# Patient Record
Sex: Male | Born: 1935 | Race: White | Hispanic: No | Marital: Married | Smoking: Former smoker
Health system: Southern US, Community
[De-identification: ages and names within clinical notes are randomized; demographics above are authoritative.]

## PROBLEM LIST (undated history)

## (undated) ENCOUNTER — Emergency Department (HOSPITAL_BASED_OUTPATIENT_CLINIC_OR_DEPARTMENT_OTHER): Payer: Medicare Other

## (undated) DIAGNOSIS — M1990 Unspecified osteoarthritis, unspecified site: Secondary | ICD-10-CM

## (undated) DIAGNOSIS — T4145XA Adverse effect of unspecified anesthetic, initial encounter: Secondary | ICD-10-CM

## (undated) DIAGNOSIS — K3184 Gastroparesis: Secondary | ICD-10-CM

## (undated) DIAGNOSIS — G4730 Sleep apnea, unspecified: Secondary | ICD-10-CM

## (undated) DIAGNOSIS — I6529 Occlusion and stenosis of unspecified carotid artery: Secondary | ICD-10-CM

## (undated) DIAGNOSIS — Z973 Presence of spectacles and contact lenses: Secondary | ICD-10-CM

## (undated) DIAGNOSIS — I499 Cardiac arrhythmia, unspecified: Secondary | ICD-10-CM

## (undated) DIAGNOSIS — I2510 Atherosclerotic heart disease of native coronary artery without angina pectoris: Secondary | ICD-10-CM

## (undated) DIAGNOSIS — R51 Headache: Secondary | ICD-10-CM

## (undated) DIAGNOSIS — R200 Anesthesia of skin: Secondary | ICD-10-CM

## (undated) DIAGNOSIS — M67919 Unspecified disorder of synovium and tendon, unspecified shoulder: Secondary | ICD-10-CM

## (undated) DIAGNOSIS — Z8719 Personal history of other diseases of the digestive system: Secondary | ICD-10-CM

## (undated) DIAGNOSIS — J45909 Unspecified asthma, uncomplicated: Secondary | ICD-10-CM

## (undated) DIAGNOSIS — K219 Gastro-esophageal reflux disease without esophagitis: Secondary | ICD-10-CM

## (undated) DIAGNOSIS — G459 Transient cerebral ischemic attack, unspecified: Secondary | ICD-10-CM

## (undated) DIAGNOSIS — I10 Essential (primary) hypertension: Secondary | ICD-10-CM

## (undated) DIAGNOSIS — K589 Irritable bowel syndrome without diarrhea: Secondary | ICD-10-CM

## (undated) DIAGNOSIS — H9190 Unspecified hearing loss, unspecified ear: Secondary | ICD-10-CM

## (undated) DIAGNOSIS — H269 Unspecified cataract: Secondary | ICD-10-CM

## (undated) DIAGNOSIS — T8859XA Other complications of anesthesia, initial encounter: Secondary | ICD-10-CM

## (undated) DIAGNOSIS — E785 Hyperlipidemia, unspecified: Secondary | ICD-10-CM

## (undated) DIAGNOSIS — R3911 Hesitancy of micturition: Secondary | ICD-10-CM

## (undated) HISTORY — DX: Irritable bowel syndrome, unspecified: K58.9

## (undated) HISTORY — PX: CAROTID ENDARTERECTOMY: SUR193

## (undated) HISTORY — PX: BACK SURGERY: SHX140

## (undated) HISTORY — DX: Unspecified asthma, uncomplicated: J45.909

## (undated) HISTORY — DX: Gastro-esophageal reflux disease without esophagitis: K21.9

## (undated) HISTORY — PX: CARDIAC CATHETERIZATION: SHX172

## (undated) HISTORY — DX: Occlusion and stenosis of unspecified carotid artery: I65.29

## (undated) HISTORY — PX: OTHER SURGICAL HISTORY: SHX169

## (undated) HISTORY — PX: COLONOSCOPY: SHX5424

## (undated) HISTORY — PX: CORONARY STENT PLACEMENT: SHX1402

## (undated) HISTORY — PX: HIATAL HERNIA REPAIR: SHX195

## (undated) HISTORY — PX: CARDIOVASCULAR STRESS TEST: SHX262

## (undated) HISTORY — PX: TONSILLECTOMY: SUR1361

## (undated) HISTORY — DX: Atherosclerotic heart disease of native coronary artery without angina pectoris: I25.10

## (undated) HISTORY — PX: KNEE ARTHROSCOPY: SHX127

## (undated) HISTORY — PX: HERNIA REPAIR: SHX51

---

## 1997-06-27 ENCOUNTER — Observation Stay (HOSPITAL_COMMUNITY): Admission: AD | Admit: 1997-06-27 | Discharge: 1997-06-28 | Payer: Self-pay | Admitting: Interventional Cardiology

## 1997-07-11 ENCOUNTER — Encounter (HOSPITAL_COMMUNITY): Admission: RE | Admit: 1997-07-11 | Discharge: 1997-10-09 | Payer: Self-pay | Admitting: Interventional Cardiology

## 1998-04-05 ENCOUNTER — Ambulatory Visit (HOSPITAL_COMMUNITY): Admission: RE | Admit: 1998-04-05 | Discharge: 1998-04-05 | Payer: Self-pay | Admitting: Gastroenterology

## 1999-02-13 ENCOUNTER — Encounter: Payer: Self-pay | Admitting: Gastroenterology

## 1999-02-13 ENCOUNTER — Ambulatory Visit (HOSPITAL_COMMUNITY): Admission: RE | Admit: 1999-02-13 | Discharge: 1999-02-13 | Payer: Self-pay | Admitting: Gastroenterology

## 2001-01-04 ENCOUNTER — Ambulatory Visit (HOSPITAL_COMMUNITY): Admission: RE | Admit: 2001-01-04 | Discharge: 2001-01-04 | Payer: Self-pay | Admitting: Surgery

## 2001-01-04 ENCOUNTER — Encounter: Payer: Self-pay | Admitting: Surgery

## 2001-11-22 ENCOUNTER — Ambulatory Visit (HOSPITAL_BASED_OUTPATIENT_CLINIC_OR_DEPARTMENT_OTHER): Admission: RE | Admit: 2001-11-22 | Discharge: 2001-11-22 | Payer: Self-pay | Admitting: Internal Medicine

## 2001-12-29 ENCOUNTER — Ambulatory Visit (HOSPITAL_BASED_OUTPATIENT_CLINIC_OR_DEPARTMENT_OTHER): Admission: RE | Admit: 2001-12-29 | Discharge: 2001-12-29 | Payer: Self-pay | Admitting: Internal Medicine

## 2004-01-22 ENCOUNTER — Encounter: Admission: RE | Admit: 2004-01-22 | Discharge: 2004-01-22 | Payer: Self-pay | Admitting: Gastroenterology

## 2006-01-08 ENCOUNTER — Ambulatory Visit (HOSPITAL_COMMUNITY): Admission: RE | Admit: 2006-01-08 | Discharge: 2006-01-08 | Payer: Self-pay | Admitting: Orthopedic Surgery

## 2007-02-23 ENCOUNTER — Encounter: Admission: RE | Admit: 2007-02-23 | Discharge: 2007-02-23 | Payer: Self-pay | Admitting: Gastroenterology

## 2007-04-23 ENCOUNTER — Encounter: Admission: RE | Admit: 2007-04-23 | Discharge: 2007-04-23 | Payer: Self-pay | Admitting: Gastroenterology

## 2007-10-19 ENCOUNTER — Encounter: Admission: RE | Admit: 2007-10-19 | Discharge: 2007-10-19 | Payer: Self-pay | Admitting: Family

## 2009-02-06 ENCOUNTER — Encounter: Admission: RE | Admit: 2009-02-06 | Discharge: 2009-02-06 | Payer: Self-pay | Admitting: Gastroenterology

## 2009-02-25 ENCOUNTER — Emergency Department (HOSPITAL_COMMUNITY): Admission: EM | Admit: 2009-02-25 | Discharge: 2009-02-25 | Payer: Self-pay | Admitting: Emergency Medicine

## 2009-05-29 ENCOUNTER — Emergency Department (HOSPITAL_COMMUNITY): Admission: EM | Admit: 2009-05-29 | Discharge: 2009-05-29 | Payer: Self-pay | Admitting: Family Medicine

## 2010-05-12 LAB — CBC
HCT: 38.6 % — ABNORMAL LOW (ref 39.0–52.0)
Platelets: 207 10*3/uL (ref 150–400)

## 2010-05-12 LAB — D-DIMER, QUANTITATIVE: D-Dimer, Quant: 0.24 ug/mL-FEU (ref 0.00–0.48)

## 2010-05-12 LAB — DIFFERENTIAL
Lymphocytes Relative: 22 % (ref 12–46)
Lymphs Abs: 1.9 10*3/uL (ref 0.7–4.0)
Monocytes Relative: 6 % (ref 3–12)
Neutrophils Relative %: 70 % (ref 43–77)

## 2010-05-12 LAB — BASIC METABOLIC PANEL
CO2: 25 mEq/L (ref 19–32)
Chloride: 104 mEq/L (ref 96–112)
Creatinine, Ser: 0.98 mg/dL (ref 0.4–1.5)
Potassium: 3.2 mEq/L — ABNORMAL LOW (ref 3.5–5.1)
Sodium: 138 mEq/L (ref 135–145)

## 2010-05-12 LAB — POCT CARDIAC MARKERS
CKMB, poc: 1.7 ng/mL (ref 1.0–8.0)
Myoglobin, poc: 68.4 ng/mL (ref 12–200)
Troponin i, poc: <0.05 ng/mL (ref 0.00–0.09)

## 2010-05-12 LAB — POCT I-STAT, CHEM 8: Chloride: 107 mEq/L (ref 96–112)

## 2010-07-12 NOTE — Op Note (Signed)
[facility]  Patient:    Alex Oconnor, Alex Oconnor Visit Number: [id number] MRN: [id number]          Service Type: DSU Location: DAY Attending Physician:  Bonnetta Barry Dictated by:   Velora Heckler, M.D. Proc. Date: 01/04/01 Admit Date:  01/04/2001   CC:         Thora Lance, M.D.  Darci Needle, M.D.   Operative Report  PREOPERATIVE DIAGNOSIS:  Right inguinal hernia.  POSTOPERATIVE DIAGNOSIS:  Right inguinal hernia.  OPERATION:  Repair of right inguinal hernia with polyester mesh.  SURGEON: Velora Heckler, M.D.  ANESTHESIA:  General  ESTIMATED BLOOD LOSS:  Minimal  PREPARATION:  Betadine  COMPLICATIONS:  None.  INDICATIONS:  The patient is a 77 year old white male who presents with long standing history of right inguinal hernia.  This had been present on physical examination.  It began causing him moderate discomfort after physical exertion.  Pain radiated into the right testicle.  The patient now comes to surgery for repair of right inguinal hernia.  DESCRIPTION OF PROCEDURE:  Procedure is done in OR #1 at the [facility].  The patient is brought to the operating room and placed in a supine position on the operating room table.  Following the administration of general anesthesia, the patient was prepped and draped in the usual strict aseptic fashion.  After ascertaining that an adequate level of anesthesia had been obtained, a right inguinal incision was made with a #10 blade.  Dissection was carried down through the subcutaneous tissues and hemostasis obtained with the electrocautery.  External oblique fascia was incised in line with its fibers and extended through the external inguinal ring.  Spermatic cord structures were encircled with a Penrose drain. There is a moderate size direct inguinal hernia present.  Hernia sac is dissected away from the cord structures.  The floor of the inguinal canal is  dissected out. Hernia is reduced through the fascial defect and the fascial defect closed with interrupted 3-0 Vicryl sutures.  Floor of the mesh was then recreated with a sheet of polyester mesh.  The mesh was cut to the appropriate dimensions.  It is secured to the pubic tubercle and along the inguinal ligament with a running 2-0 Novofil suture.  Mesh was split to accommodate the cord structures.  The superior margin of the mesh was secured to the transversalis and internal oblique fascia with interrupted 2-0 Novofil sutures.  Tails of the mesh were overlapped lateral to the cords structures and the inferior edges were secured to the inguinal ligament with a single interrupted 2-0 Novofil suture.  Good hemostasis was noted.  Cord structures were returned to the inguinal canal.  Local field block is placed with Marcaine.  External oblique fascia is closed with interrupted 3-0 Vicryl sutures.  Subcutaneous tissues were reapproximated with interrupted 3-0 Vicryl sutures.  Skin edges are anesthetized with local anesthetic.  Skin edges are reapproximated with interrupted 4-0 Vicryl subcuticular sutures.  Wound is washed and dried and Benzoin and Steri-Strips are applied.  Sterile gauze dressings are applied.  The patient is awakened from anesthesia and brought to the recovery room in stable condition.  The patient tolerated the procedure well. Dictated by:   Velora Heckler, M.D. Attending Physician:  Bonnetta Barry DD:  01/04/01 TD:  01/05/01 Job: [id number]

## 2010-07-12 NOTE — Op Note (Signed)
NAME:  Alex Oconnor, Alex Oconnor               ACCOUNT NO.:  [id number]   MEDICAL RECORD NO.:  [id number]          PATIENT TYPE:  AMB   LOCATION:  SDS                          FACILITY:  [facility]   PHYSICIAN:  Vania Rea. Supple, M.D.  DATE OF BIRTH:  06/28/1932   DATE OF PROCEDURE:  01/08/2006  DATE OF DISCHARGE:  01/08/2006                               OPERATIVE REPORT   PREOPERATIVE DIAGNOSIS:  Left knee medial meniscus tear.   POSTOPERATIVE DIAGNOSES:  1. Left knee medial meniscus tear, anterior horn.  2. Advanced left knee patellofemoral joint arthrosis.  3. Diffuse synovitis.  4. Multiple intraarticular chondral loose bodies.   PROCEDURE:  1. Left knee diagnostic arthroscopy.  2. Anterior horn partial medial meniscectomy.  3. Extensive synovectomy including medial plica resection.  4. Chondroplasty of the patellofemoral joint.  5. Removal of multiple chondral loose bodies.   SURGEON:  Vania Rea. Supple, M.D.   Threasa HeadsFrench Ana A. Shuford, PA-C.   ANESTHESIA:  Local with intravenous sedation.   TOURNIQUET TIME:  None was used.   ESTIMATED BLOOD LOSS:  Minimal.   DRAINS:  None.   HISTORY:  Mr. Drees is a 77 year old gentleman who has had chronic left  knee pain, swelling, and mechanical symptoms which have been refractory  to prolonged attempts at conservative management.  Due to his ongoing  pain and functional limitations, he is brought to the operating room on  this date for planned left knee arthroscopy as described below.   Preoperatively I counseled Mr. Eley on treatment options, as well as  risks versus benefits thereof.  Possible surgical complications,  bleeding, infection, neurovascular injury, DVT, PE, and persistence of  pain were reviewed.  We also discussed arthroscopic surgery would not  change any ongoing osteoarthrosis.  He understands, accepts and agrees  with our planned procedure.   PROCEDURE IN DETAIL:  After undergoing routine preoperative evaluation,  the patient received prophylactic antibiotics.  Knee block anesthetic  was placed in the holding area by the anesthesia department.  Placed  supine on the operating table with the left leg placed in a leg holder  and sterilely prepped and draped in standard fashion.  Standard portals  were established and diagnostic arthroscopy was performed.  The  suprapatellar pouch and gutter showed several cartilaginous loose  bodies, all of which were evacuated through the arthroscopic cannula and  with a shaver.  The patellofemoral joint showed broad areas of grade 3  chondromalacia and a broad area of exposed chondral bone over the distal  aspect of the medial femoral condyle on the trochlear groove medially.  Multiple chondral flaps were noted.  A shaver was introduced and used to  aggressively debride these areas down to a stable cartilaginous base.  There did appear to be relatively normal patellar tracking.  The  intercondylar notch showed the ACL to be intact.  Medially, there was a  small tear of the anterior horn of the medial meniscus which was  debrided.  The articular surfaces over the posterior margin of the  femoral condyle as well as the medial tibial plateau were in  excellent  condition.  The majority of the medial meniscus was intact, probed, and  found to be stable.  Laterally, there was also excellent preservation of  the articular surfaces, and the lateral meniscus was carefully probed  and found to be stable.  There was a very thickened fibrotic medial  plica with significant synovitis.  We performed an extensive synovectomy  including medial plica resection.  At this point, final inspection and  irrigation was then completed.  Fluid and instruments were removed.  A  combination of Marcaine, morphine, and epinephrine was instilled into  the joint as well as around the portals.  The portal was closed with  Steri-Strips.  A bulky dry dressing was then wrapped around the left  knee.   The leg was wrapped with an Ace bandage.  The patient was then  transferred to the recovery room in stable condition.      Vania Rea. Supple, M.D.  Electronically Signed     KMS/MEDQ  D:  01/08/2006  T:  01/09/2006  Job:  [id number]

## 2010-10-11 ENCOUNTER — Other Ambulatory Visit: Payer: Self-pay | Admitting: Neurosurgery

## 2010-10-11 DIAGNOSIS — M5416 Radiculopathy, lumbar region: Secondary | ICD-10-CM

## 2010-10-15 ENCOUNTER — Ambulatory Visit
Admission: RE | Admit: 2010-10-15 | Discharge: 2010-10-15 | Disposition: A | Discharge status: Home / Self Care | Payer: Medicare Other | Source: Ambulatory Visit | Attending: Neurosurgery | Admitting: Neurosurgery

## 2010-10-15 DIAGNOSIS — M5416 Radiculopathy, lumbar region: Secondary | ICD-10-CM

## 2010-12-16 ENCOUNTER — Other Ambulatory Visit (HOSPITAL_COMMUNITY): Payer: Self-pay | Admitting: Neurosurgery

## 2010-12-16 ENCOUNTER — Ambulatory Visit (HOSPITAL_COMMUNITY)
Admission: RE | Admit: 2010-12-16 | Discharge: 2010-12-16 | Disposition: A | Discharge status: Home / Self Care | Payer: Medicare Other | Source: Ambulatory Visit | Attending: Neurosurgery | Admitting: Neurosurgery

## 2010-12-16 ENCOUNTER — Encounter (HOSPITAL_COMMUNITY)
Admission: RE | Admit: 2010-12-16 | Discharge: 2010-12-16 | Disposition: A | Discharge status: Home / Self Care | Payer: Medicare Other | Source: Ambulatory Visit | Attending: Neurosurgery | Admitting: Neurosurgery

## 2010-12-16 DIAGNOSIS — Z01812 Encounter for preprocedural laboratory examination: Secondary | ICD-10-CM | POA: Insufficient documentation

## 2010-12-16 DIAGNOSIS — Z981 Arthrodesis status: Secondary | ICD-10-CM

## 2010-12-16 DIAGNOSIS — Z01818 Encounter for other preprocedural examination: Secondary | ICD-10-CM | POA: Insufficient documentation

## 2010-12-16 LAB — BASIC METABOLIC PANEL
CO2: 31 mEq/L (ref 19–32)
Calcium: 9.9 mg/dL (ref 8.4–10.5)
Creatinine, Ser: 1.09 mg/dL (ref 0.50–1.35)
GFR calc Af Amer: 74 mL/min — ABNORMAL LOW (ref >90)
GFR calc non Af Amer: 64 mL/min — ABNORMAL LOW (ref >90)
Sodium: 142 mEq/L (ref 135–145)

## 2010-12-16 LAB — TYPE AND SCREEN

## 2010-12-16 LAB — CBC
HCT: 39 % (ref 39.0–52.0)
Hemoglobin: 13.4 g/dL (ref 13.0–17.0)
MCH: 31.4 pg (ref 26.0–34.0)
RDW: 13 % (ref 11.5–15.5)

## 2010-12-16 LAB — SURGICAL PCR SCREEN: Staphylococcus aureus: NEGATIVE

## 2010-12-17 ENCOUNTER — Inpatient Hospital Stay (HOSPITAL_COMMUNITY)
Admission: RE | Admit: 2010-12-17 | Discharge: 2010-12-20 | DRG: 458 | Disposition: A | Discharge status: Home / Self Care | Payer: Medicare Other | Source: Ambulatory Visit | Attending: Neurosurgery | Admitting: Neurosurgery

## 2010-12-17 ENCOUNTER — Other Ambulatory Visit (HOSPITAL_COMMUNITY): Payer: Self-pay | Admitting: Neurosurgery

## 2010-12-17 ENCOUNTER — Ambulatory Visit (HOSPITAL_COMMUNITY)
Admission: RE | Admit: 2010-12-17 | Discharge: 2010-12-17 | Disposition: A | Discharge status: Home / Self Care | Payer: Medicare Other | Source: Ambulatory Visit | Attending: Neurosurgery | Admitting: Neurosurgery

## 2010-12-17 DIAGNOSIS — M47816 Spondylosis without myelopathy or radiculopathy, lumbar region: Secondary | ICD-10-CM

## 2010-12-17 DIAGNOSIS — Z01812 Encounter for preprocedural laboratory examination: Secondary | ICD-10-CM

## 2010-12-17 DIAGNOSIS — M47817 Spondylosis without myelopathy or radiculopathy, lumbosacral region: Secondary | ICD-10-CM | POA: Diagnosis present

## 2010-12-17 DIAGNOSIS — Q762 Congenital spondylolisthesis: Secondary | ICD-10-CM

## 2010-12-17 DIAGNOSIS — I10 Essential (primary) hypertension: Secondary | ICD-10-CM | POA: Diagnosis present

## 2010-12-17 DIAGNOSIS — I2510 Atherosclerotic heart disease of native coronary artery without angina pectoris: Secondary | ICD-10-CM | POA: Diagnosis present

## 2010-12-17 DIAGNOSIS — M48061 Spinal stenosis, lumbar region without neurogenic claudication: Secondary | ICD-10-CM

## 2010-12-17 DIAGNOSIS — K219 Gastro-esophageal reflux disease without esophagitis: Secondary | ICD-10-CM | POA: Diagnosis present

## 2010-12-17 DIAGNOSIS — G4733 Obstructive sleep apnea (adult) (pediatric): Secondary | ICD-10-CM | POA: Diagnosis present

## 2010-12-17 DIAGNOSIS — M4120 Other idiopathic scoliosis, site unspecified: Principal | ICD-10-CM | POA: Diagnosis present

## 2010-12-17 DIAGNOSIS — Z01818 Encounter for other preprocedural examination: Secondary | ICD-10-CM

## 2010-12-24 NOTE — Op Note (Signed)
NAMEODIS, TURCK NO.:  [id number]  MEDICAL RECORD NO.:  [id number]  LOCATION:  3009                         FACILITY:  [facility]  PHYSICIAN:  Danae Orleans. Venetia Maxon, M.D.  DATE OF BIRTH:  1932/07/20  DATE OF PROCEDURE:  12/17/2010 DATE OF DISCHARGE:                              OPERATIVE REPORT   PREOPERATIVE DIAGNOSES:  L4-5 scoliosis, spondylolisthesis stenosis, spondylosis and radiculopathy.  POSTOPERATIVE DIAGNOSES:  L4-5 scoliosis, spondylolisthesis stenosis, spondylosis and radiculopathy.  PROCEDURES: 1. L4-5 decompressive laminectomy in excess of that required for     posterior lumbar interbody fusion. 2. Posterior lumbar interbody fusion with 12-mm PEEK interbody cages     with local autograft and allograft. 3. Pedicle screw fixation nonsegmental, L4 through L5 bilaterally. 4. Posterolateral arthrodesis, L4 through L5 levels.  SURGEON:  Danae Orleans. Venetia Maxon, MD  ASSISTANT:  Georgiann Cocker, RN and Coletta Memos, MD  ANESTHESIA:  General endotracheal anesthesia.  ESTIMATED BLOOD LOSS:  150 mL.  COMPLICATIONS:  None.  DISPOSITION:  To Recovery.  INDICATIONS:  Alex Oconnor is a 77 year old man with spondylolisthesis at L4-5 with spondylosis stenosis and scoliosis and lumbar radiculopathy.  It was elected to take him to Surgery for decompression and fusion at this affected level.  PROCEDURE:  Mr. Baldree was brought to the operating room.  Following satisfactory and uncomplicated induction of general endotracheal anesthesia and placement of intravenous lines and Foley catheter, the patient was placed in the prone position on the [place] table.  His soft tissue and bony prominences were padded appropriately.  Low back was prepped and draped in usual sterile fashion.  Area of planned incision was infiltrated with local lidocaine.  Incision was made in the midline, carried to the lumbodorsal fascia and was incised sharply bilaterally. Subperiosteal  dissection was performed exposing the L4 and L5 transverse processes and the highly degenerated L4-5 facet joint complex. Intraoperative x-ray confirmed correct position with marker probes at the L4 and L5 transverse processes.  A total laminectomy of L4 leaving an inferior rim of the superior aspect of L4 and then disarticulation of the facet joints was performed.  There was severe spondylosis on the right side with dense adhesions around the L5 nerve root, which was carefully mobilized medially.  On the left side, there was less severe compression of the neural elements, these were mobilized.  Laminar spreader was placed between the L4 and L5 spinous processes, and a thorough decompression of the lateral aspect of the spinal canal was performed on the left and then a thorough diskectomy with preparation of the endplates was also performed.  This was done extensively and used disk preparation curettes and variety of pituitary rongeurs to strip the cartilaginous and disk material from the endplates of L4 and L5.  After placing a 12-mm trial sizer on the left, thorough decompression on the right side was performed.  The neural elements were mobilized and the thorough diskectomy was performed at this level.  The local bone had been run through the bone mill and then was placed in the interspace using a total of 6 mL of bone autograft on each side for total of 12 mL along with 12-mm medium PEEK interbody  cages, which were packed with local bone autograft.  The cages were placed at each side and tamped into position, and additional Nexus bone allograft, which was then mixed with bone marrow-rich blood aspirated through the Jamshidi needle and the pedicle was then utilized and tamped it overlying the bone and cage implants.  The pedicle screws were then placed after preparing the posterolateral region bilaterally for additional posterolateral bone graft with decortication and stripping of  muscle overlying the transverse processes of L4 and L5.  The 6.5 x 50-mm screws were placed at L4 and 6.5 x 45-mm screws were placed at L5.  All screws had excellent purchase.  Their positioning was confirmed on AP and lateral fluoroscopy.  A 6 mL of local autograft was placed on the left side and tamped into position, and approximately 8 mL of the remaining Nexus was placed on the right side and tamped into position.  The 35-mm rods were placed over the screw heads and locked down in situ.  Prior to placing the final bone graft, the wound was extensively irrigated, the fascia was closed with 1 Vicryl sutures, subcutaneous tissues were reapproximated with 2-0 Vicryl interrupted inverted sutures and skin edges were reapproximated with 3-0 Vicryl subcuticular stitch.  The wound was dressed with sterile occlusive dressing.  The patient was taken to the recovery room and tolerated the procedure well.     Danae Orleans. Venetia Maxon, M.D.     JDS/MEDQ  D:  12/17/2010  T:  12/18/2010  Job:  [id number]  Electronically Signed by Maeola Harman M.D. on 12/24/2010 07:48:43 AM

## 2011-02-28 ENCOUNTER — Other Ambulatory Visit: Payer: Self-pay | Admitting: Gastroenterology

## 2011-02-28 DIAGNOSIS — R110 Nausea: Secondary | ICD-10-CM

## 2011-03-03 ENCOUNTER — Ambulatory Visit
Admission: RE | Admit: 2011-03-03 | Discharge: 2011-03-03 | Disposition: A | Discharge status: Home / Self Care | Payer: Medicare Other | Source: Ambulatory Visit | Attending: Gastroenterology | Admitting: Gastroenterology

## 2011-03-03 DIAGNOSIS — K7689 Other specified diseases of liver: Secondary | ICD-10-CM | POA: Diagnosis not present

## 2011-03-03 DIAGNOSIS — R110 Nausea: Secondary | ICD-10-CM

## 2011-03-03 DIAGNOSIS — N281 Cyst of kidney, acquired: Secondary | ICD-10-CM | POA: Diagnosis not present

## 2011-03-06 ENCOUNTER — Other Ambulatory Visit (HOSPITAL_COMMUNITY): Payer: Self-pay | Admitting: Gastroenterology

## 2011-03-06 DIAGNOSIS — R110 Nausea: Secondary | ICD-10-CM

## 2011-03-24 ENCOUNTER — Inpatient Hospital Stay (HOSPITAL_COMMUNITY)
Admission: RE | Admit: 2011-03-24 | Discharge: 2011-03-24 | Payer: Medicare Other | Source: Ambulatory Visit | Attending: Gastroenterology | Admitting: Gastroenterology

## 2011-04-21 ENCOUNTER — Encounter (HOSPITAL_COMMUNITY)
Admission: RE | Admit: 2011-04-21 | Discharge: 2011-04-21 | Disposition: A | Discharge status: Home / Self Care | Payer: Medicare Other | Source: Ambulatory Visit | Attending: Gastroenterology | Admitting: Gastroenterology

## 2011-04-21 DIAGNOSIS — R1011 Right upper quadrant pain: Secondary | ICD-10-CM | POA: Diagnosis not present

## 2011-04-21 DIAGNOSIS — R110 Nausea: Secondary | ICD-10-CM | POA: Diagnosis not present

## 2011-04-21 MED ORDER — TECHNETIUM TC 99M MEBROFENIN IV KIT
5.5000 | PACK | Freq: Once | INTRAVENOUS | Status: AC | PRN
  Administered 2011-04-21: 6 via INTRAVENOUS

## 2011-04-29 DIAGNOSIS — K3189 Other diseases of stomach and duodenum: Secondary | ICD-10-CM | POA: Diagnosis not present

## 2011-04-29 DIAGNOSIS — R1013 Epigastric pain: Secondary | ICD-10-CM | POA: Diagnosis not present

## 2011-04-30 DIAGNOSIS — R143 Flatulence: Secondary | ICD-10-CM | POA: Diagnosis not present

## 2011-04-30 DIAGNOSIS — R141 Gas pain: Secondary | ICD-10-CM | POA: Diagnosis not present

## 2011-05-12 DIAGNOSIS — M545 Low back pain: Secondary | ICD-10-CM | POA: Diagnosis not present

## 2011-05-12 DIAGNOSIS — M4310 Spondylolisthesis, site unspecified: Secondary | ICD-10-CM | POA: Diagnosis not present

## 2011-05-16 DIAGNOSIS — M545 Low back pain: Secondary | ICD-10-CM | POA: Diagnosis not present

## 2011-05-19 DIAGNOSIS — M545 Low back pain: Secondary | ICD-10-CM | POA: Diagnosis not present

## 2011-05-22 DIAGNOSIS — L821 Other seborrheic keratosis: Secondary | ICD-10-CM | POA: Diagnosis not present

## 2011-05-22 DIAGNOSIS — L570 Actinic keratosis: Secondary | ICD-10-CM | POA: Diagnosis not present

## 2011-05-26 DIAGNOSIS — M545 Low back pain: Secondary | ICD-10-CM | POA: Diagnosis not present

## 2011-05-28 DIAGNOSIS — M545 Low back pain: Secondary | ICD-10-CM | POA: Diagnosis not present

## 2011-06-02 DIAGNOSIS — M545 Low back pain: Secondary | ICD-10-CM | POA: Diagnosis not present

## 2011-06-05 DIAGNOSIS — M545 Low back pain: Secondary | ICD-10-CM | POA: Diagnosis not present

## 2011-06-09 DIAGNOSIS — M545 Low back pain: Secondary | ICD-10-CM | POA: Diagnosis not present

## 2011-06-12 DIAGNOSIS — M545 Low back pain: Secondary | ICD-10-CM | POA: Diagnosis not present

## 2011-06-18 DIAGNOSIS — M545 Low back pain: Secondary | ICD-10-CM | POA: Diagnosis not present

## 2011-06-20 DIAGNOSIS — I10 Essential (primary) hypertension: Secondary | ICD-10-CM | POA: Diagnosis not present

## 2011-06-27 DIAGNOSIS — M545 Low back pain: Secondary | ICD-10-CM | POA: Diagnosis not present

## 2011-07-14 DIAGNOSIS — M79609 Pain in unspecified limb: Secondary | ICD-10-CM | POA: Diagnosis not present

## 2011-07-14 DIAGNOSIS — M1710 Unilateral primary osteoarthritis, unspecified knee: Secondary | ICD-10-CM | POA: Diagnosis not present

## 2011-09-29 DIAGNOSIS — M79609 Pain in unspecified limb: Secondary | ICD-10-CM | POA: Diagnosis not present

## 2011-10-23 DIAGNOSIS — R0602 Shortness of breath: Secondary | ICD-10-CM | POA: Diagnosis not present

## 2011-10-23 DIAGNOSIS — R209 Unspecified disturbances of skin sensation: Secondary | ICD-10-CM | POA: Diagnosis not present

## 2011-10-23 DIAGNOSIS — R5383 Other fatigue: Secondary | ICD-10-CM | POA: Diagnosis not present

## 2011-10-23 DIAGNOSIS — R5381 Other malaise: Secondary | ICD-10-CM | POA: Diagnosis not present

## 2011-10-29 DIAGNOSIS — I10 Essential (primary) hypertension: Secondary | ICD-10-CM | POA: Diagnosis not present

## 2011-10-29 DIAGNOSIS — R1013 Epigastric pain: Secondary | ICD-10-CM | POA: Diagnosis not present

## 2011-10-29 DIAGNOSIS — R0602 Shortness of breath: Secondary | ICD-10-CM | POA: Diagnosis not present

## 2011-10-29 DIAGNOSIS — R0789 Other chest pain: Secondary | ICD-10-CM | POA: Diagnosis not present

## 2011-10-29 DIAGNOSIS — R5383 Other fatigue: Secondary | ICD-10-CM | POA: Diagnosis not present

## 2011-10-29 DIAGNOSIS — R143 Flatulence: Secondary | ICD-10-CM | POA: Diagnosis not present

## 2011-10-29 DIAGNOSIS — I2510 Atherosclerotic heart disease of native coronary artery without angina pectoris: Secondary | ICD-10-CM | POA: Diagnosis not present

## 2011-10-29 DIAGNOSIS — R5381 Other malaise: Secondary | ICD-10-CM | POA: Diagnosis not present

## 2011-10-31 DIAGNOSIS — R0789 Other chest pain: Secondary | ICD-10-CM | POA: Diagnosis not present

## 2011-10-31 DIAGNOSIS — I10 Essential (primary) hypertension: Secondary | ICD-10-CM | POA: Diagnosis not present

## 2011-10-31 DIAGNOSIS — I2510 Atherosclerotic heart disease of native coronary artery without angina pectoris: Secondary | ICD-10-CM | POA: Diagnosis not present

## 2011-10-31 DIAGNOSIS — R5381 Other malaise: Secondary | ICD-10-CM | POA: Diagnosis not present

## 2011-10-31 DIAGNOSIS — R0602 Shortness of breath: Secondary | ICD-10-CM | POA: Diagnosis not present

## 2011-11-12 DIAGNOSIS — Z23 Encounter for immunization: Secondary | ICD-10-CM | POA: Diagnosis not present

## 2011-11-20 DIAGNOSIS — L570 Actinic keratosis: Secondary | ICD-10-CM | POA: Diagnosis not present

## 2011-11-20 DIAGNOSIS — D1801 Hemangioma of skin and subcutaneous tissue: Secondary | ICD-10-CM | POA: Diagnosis not present

## 2011-11-20 DIAGNOSIS — L821 Other seborrheic keratosis: Secondary | ICD-10-CM | POA: Diagnosis not present

## 2011-11-20 DIAGNOSIS — Z85828 Personal history of other malignant neoplasm of skin: Secondary | ICD-10-CM | POA: Diagnosis not present

## 2011-11-20 DIAGNOSIS — L820 Inflamed seborrheic keratosis: Secondary | ICD-10-CM | POA: Diagnosis not present

## 2011-11-27 DIAGNOSIS — R143 Flatulence: Secondary | ICD-10-CM | POA: Diagnosis not present

## 2011-11-27 DIAGNOSIS — R141 Gas pain: Secondary | ICD-10-CM | POA: Diagnosis not present

## 2011-11-27 DIAGNOSIS — Z7982 Long term (current) use of aspirin: Secondary | ICD-10-CM | POA: Diagnosis not present

## 2011-11-27 DIAGNOSIS — R110 Nausea: Secondary | ICD-10-CM | POA: Diagnosis not present

## 2011-11-27 DIAGNOSIS — Z79899 Other long term (current) drug therapy: Secondary | ICD-10-CM | POA: Diagnosis not present

## 2011-12-06 ENCOUNTER — Observation Stay (HOSPITAL_COMMUNITY)
Admission: EM | Admit: 2011-12-06 | Discharge: 2011-12-08 | Disposition: A | Discharge status: Home / Self Care | Payer: Medicare Other | Attending: Internal Medicine | Admitting: Internal Medicine

## 2011-12-06 ENCOUNTER — Encounter (HOSPITAL_COMMUNITY): Payer: Self-pay | Admitting: Emergency Medicine

## 2011-12-06 ENCOUNTER — Emergency Department (HOSPITAL_COMMUNITY): Payer: Medicare Other

## 2011-12-06 DIAGNOSIS — R479 Unspecified speech disturbances: Secondary | ICD-10-CM

## 2011-12-06 DIAGNOSIS — I6789 Other cerebrovascular disease: Secondary | ICD-10-CM | POA: Diagnosis not present

## 2011-12-06 DIAGNOSIS — I2510 Atherosclerotic heart disease of native coronary artery without angina pectoris: Secondary | ICD-10-CM | POA: Diagnosis not present

## 2011-12-06 DIAGNOSIS — K6389 Other specified diseases of intestine: Secondary | ICD-10-CM | POA: Diagnosis not present

## 2011-12-06 DIAGNOSIS — R51 Headache: Secondary | ICD-10-CM | POA: Insufficient documentation

## 2011-12-06 DIAGNOSIS — R4789 Other speech disturbances: Secondary | ICD-10-CM | POA: Diagnosis not present

## 2011-12-06 DIAGNOSIS — I10 Essential (primary) hypertension: Secondary | ICD-10-CM | POA: Diagnosis present

## 2011-12-06 DIAGNOSIS — G319 Degenerative disease of nervous system, unspecified: Secondary | ICD-10-CM | POA: Diagnosis not present

## 2011-12-06 DIAGNOSIS — F29 Unspecified psychosis not due to a substance or known physiological condition: Secondary | ICD-10-CM | POA: Diagnosis not present

## 2011-12-06 DIAGNOSIS — Z8679 Personal history of other diseases of the circulatory system: Secondary | ICD-10-CM

## 2011-12-06 DIAGNOSIS — G459 Transient cerebral ischemic attack, unspecified: Secondary | ICD-10-CM | POA: Diagnosis not present

## 2011-12-06 DIAGNOSIS — Z8673 Personal history of transient ischemic attack (TIA), and cerebral infarction without residual deficits: Secondary | ICD-10-CM | POA: Diagnosis present

## 2011-12-06 DIAGNOSIS — G454 Transient global amnesia: Secondary | ICD-10-CM | POA: Diagnosis present

## 2011-12-06 HISTORY — DX: Essential (primary) hypertension: I10

## 2011-12-06 LAB — DIFFERENTIAL
Basophils Relative: 0 % (ref 0–1)
Eosinophils Absolute: 0.1 10*3/uL (ref 0.0–0.7)
Neutrophils Relative %: 66 % (ref 43–77)

## 2011-12-06 LAB — COMPREHENSIVE METABOLIC PANEL
Alkaline Phosphatase: 63 U/L (ref 39–117)
BUN: 27 mg/dL — ABNORMAL HIGH (ref 6–23)
Chloride: 102 mEq/L (ref 96–112)
GFR calc Af Amer: 78 mL/min — ABNORMAL LOW (ref >90)
Glucose, Bld: 134 mg/dL — ABNORMAL HIGH (ref 70–99)
Potassium: 4.2 mEq/L (ref 3.5–5.1)
Total Bilirubin: 0.3 mg/dL (ref 0.3–1.2)

## 2011-12-06 LAB — PROTIME-INR
INR: 1.04 (ref 0.00–1.49)
Prothrombin Time: 13.5 seconds (ref 11.6–15.2)

## 2011-12-06 LAB — CBC
HCT: 36.4 % — ABNORMAL LOW (ref 39.0–52.0)
Hemoglobin: 12.6 g/dL — ABNORMAL LOW (ref 13.0–17.0)
MCHC: 34.6 g/dL (ref 30.0–36.0)
WBC: 7.7 10*3/uL (ref 4.0–10.5)

## 2011-12-06 LAB — GLUCOSE, CAPILLARY

## 2011-12-06 MED ORDER — SAW PALMETTO (SERENOA REPENS) 500 MG PO CAPS: 500.0000 mg | ORAL_CAPSULE | Freq: Every day | ORAL | Status: DC

## 2011-12-06 MED ORDER — OSTEO BI-FLEX ADV TRIPLE ST PO TABS: 1.0000 | ORAL_TABLET | Freq: Every day | ORAL | Status: DC

## 2011-12-06 MED ORDER — ADULT MULTIVITAMIN W/MINERALS CH
1.0000 | ORAL_TABLET | Freq: Every day | ORAL | Status: DC
  Administered 2011-12-07: 1 via ORAL
  Filled 2011-12-06 (×2): qty 1

## 2011-12-06 MED ORDER — ENOXAPARIN SODIUM 40 MG/0.4ML ~~LOC~~ SOLN
40.0000 mg | SUBCUTANEOUS | Status: DC
  Administered 2011-12-06 – 2011-12-07 (×2): 40 mg via SUBCUTANEOUS
  Filled 2011-12-06 (×3): qty 0.4

## 2011-12-06 MED ORDER — VITAMIN C 500 MG PO TABS: 500.0000 mg | ORAL_TABLET | Freq: Every day | ORAL | Status: DC

## 2011-12-06 MED ORDER — CLOPIDOGREL BISULFATE 75 MG PO TABS
75.0000 mg | ORAL_TABLET | Freq: Every day | ORAL | Status: DC
  Administered 2011-12-07 – 2011-12-08 (×2): 75 mg via ORAL
  Filled 2011-12-06 (×3): qty 1

## 2011-12-06 MED ORDER — SIMVASTATIN 20 MG PO TABS
20.0000 mg | ORAL_TABLET | Freq: Every evening | ORAL | Status: DC
  Administered 2011-12-06 – 2011-12-07 (×2): 20 mg via ORAL
  Filled 2011-12-06 (×3): qty 1

## 2011-12-06 MED ORDER — VITAMIN D3 25 MCG (1000 UNIT) PO TABS
1000.0000 [IU] | ORAL_TABLET | Freq: Every day | ORAL | Status: DC
  Administered 2011-12-07: 1000 [IU] via ORAL
  Filled 2011-12-06 (×2): qty 1

## 2011-12-06 NOTE — H&P (Signed)
[facility] History and Physical  Alex Oconnor:[id number] DOB: 11/23/1932 DOA: 12/06/2011  Referring physician: Dr. Francesco Sor PCP: Dr Kirby Funk   Chief Complaint: Transient confusion with word finding difficulty.  HPI:  78 year old male with history of hypertension, CAD status post cardiac cath with stenting in 1999 (Dr. Garnette Scheuermann is his cardiologist), history of right carotid stenosis status post stenting about 19 years back, bacterial overgrowth syndrome for which he follows at [facility] was brought in by his wife today after he had a brief episode of word finding difficulty and confusion while having lunch. His wife noticed that he was stating at the food and was unable to say any words. She was also having some shaking of his hands and that we take. Patient informs that he doesn't recall the event but was was clouded and staring at the food as he did not know what to do. He denies any blurry vision, headaches, dizziness, chest pain, shortness of breath, palpitations, abdominal pain, nausea and vomiting. He denies any bowel or urinary symptoms. His symptoms resolved after a few minutes. At baseline he is fairly active. In the ED a  Code stroke was called and canceled as his symptoms had resolved by the time he came to the ED. A head CT was obtained which was unremarkable. [facility] called in to admit patient for TIA workup.  Review of Systems:  Constitutional:Denies fever, chills, diaphoresis, appetite change and fatigue.  HEENT: denies  eye pain, redness, hearing loss, ear pain, congestion, sore throat, rhinorrhea, sneezing, mouth sores, trouble swallowing, neck pain, neck stiffness and tinnitus.   Respiratory: Denies SOB, DOE, cough, chest tightness,  and wheezing.   Cardiovascular: Denies chest pain, palpitations and leg swelling.  Gastrointestinal: Denies nausea, vomiting, abdominal pain, diarrhea, constipation, blood in stool and abdominal distention.    Genitourinary: Denies dysuria, urgency, frequency, hematuria, flank pain and difficulty urinating.  Musculoskeletal: Denies myalgias, back pain, joint swelling, arthralgias and gait problem.  Skin: Denies pallor, rash and wound.  Neurological: Denies dizziness, seizures, syncope, weakness, light-headedness, numbness and headaches.  had confusion will work finding difficulties and some shaking of hands with mild diaphoresis Hematological: Denies adenopathy. Easy bruising, personal or family bleeding history  Psychiatric/Behavioral: Denies suicidal ideation, mood changes, confusion, nervousness, sleep disturbance and agitation   History reviewed. No pertinent past medical history. Past Surgical History  Procedure Date  . Carotid endarterectomy    Social History:  does not have a smoking history on file. He does not have any smokeless tobacco history on file. His alcohol and drug histories not on file.  Allergies  Allergen Reactions  . Penicillins Hives    History reviewed. No pertinent family history.  Prior to Admission medications   Medication Sig Start Date End Date Taking? Authorizing Provider  benazepril (LOTENSIN) 20 MG tablet Take 20 mg by mouth daily.   Yes Historical Provider, MD  benazepril-hydrochlorthiazide (LOTENSIN HCT) 20-25 MG per tablet Take 1 tablet by mouth daily.   Yes Historical Provider, MD  cholecalciferol (VITAMIN D) 1000 UNITS tablet Take 1,000 Units by mouth daily.   Yes Historical Provider, MD  fexofenadine (ALLEGRA) 180 MG tablet Take 180 mg by mouth daily.   Yes Historical Provider, MD  Misc Natural Products (OSTEO BI-FLEX ADV TRIPLE ST PO) Take 1 tablet by mouth daily.   Yes Historical Provider, MD  Multiple Vitamin (MULTIVITAMIN WITH MINERALS) TABS Take 1 tablet by mouth daily.   Yes Historical Provider, MD  omeprazole-sodium bicarbonate (ZEGERID) 40-1100  MG per capsule Take 1 capsule by mouth daily before breakfast.   Yes Historical Provider, MD  saw  palmetto 500 MG capsule Take 500 mg by mouth daily.   Yes Historical Provider, MD  simvastatin (ZOCOR) 20 MG tablet Take 20 mg by mouth every evening.   Yes Historical Provider, MD  vitamin C (ASCORBIC ACID) 500 MG tablet Take 500 mg by mouth daily.   Yes Historical Provider, MD    Physical Exam:  Filed Vitals:   12/06/11 1418 12/06/11 1430 12/06/11 1500 12/06/11 1530  BP: 122/66 120/66 117/76 132/84  Pulse: 74 64 63 57  Resp: 14 15 16 17   Height: 5\' 8"  (1.727 m)     Weight: 65.772 kg (145 lb)     SpO2: 97% 97% 95% 95%    Constitutional: Vital signs reviewed.  Patient is a well-developed and well-nourished in no acute distress and cooperative with exam. Alert and oriented x3.  Head: Normocephalic and atraumatic Ear: TM normal bilaterally Mouth: no erythema or exudates, MMM Eyes: PERRL, EOMI, conjunctivae normal, No scleral icterus.  Neck: Supple, Trachea midline normal ROM, No JVD, mass, thyromegaly, or carotid bruit present.  Cardiovascular: RRR, S1 normal, S2 normal, no MRG, pulses symmetric and intact bilaterally Pulmonary/Chest: CTAB, no wheezes, rales, or rhonchi Abdominal: Soft. Non-tender, non-distended, bowel sounds are normal, no masses, organomegaly, or guarding present.  GU: no CVA tenderness Musculoskeletal: No joint deformities, erythema, or stiffness, ROM full and no nontender Ext: no edema and no cyanosis, pulses palpable bilaterally (DP and PT) Hematology: no cervical, inginal, or axillary adenopathy.  Neurological: A&O x3, Strenght is normal and symmetric bilaterally, cranial nerve II-XII are grossly intact, no focal motor deficit, sensory intact to light touch bilaterally.  Skin: Warm, dry and intact. No rash, cyanosis, or clubbing.  Psychiatric: Normal mood and affect. speech and behavior is normal. Judgment and thought content normal. Cognition and memory are normal.   Labs on Admission:  Basic Metabolic Panel: No results found for this basename:  NA:5,K:5,CL:5,CO2:5,GLUCOSE:5,BUN:5,CREATININE:5,CALCIUM:5,MG:5,PHOS:5 in the last 168 hours Liver Function Tests: No results found for this basename: AST:5,ALT:5,ALKPHOS:5,BILITOT:5,PROT:5,ALBUMIN:5 in the last 168 hours No results found for this basename: LIPASE:5,AMYLASE:5 in the last 168 hours No results found for this basename: AMMONIA:5 in the last 168 hours CBC: No results found for this basename: WBC:5,NEUTROABS:5,HGB:5,HCT:5,MCV:5,PLT:5 in the last 168 hours Cardiac Enzymes: No results found for this basename: CKTOTAL:5,CKMB:5,CKMBINDEX:5,TROPONINI:5 in the last 168 hours BNP: No components found with this basename: POCBNP:5 CBG: No results found for this basename: GLUCAP:5 in the last 168 hours  Radiological Exams on Admission: Ct Head Wo Contrast  12/06/2011  *RADIOLOGY REPORT*  Clinical Data: Slurred speech.  Code stroke.  CT HEAD WITHOUT CONTRAST  Technique:  Contiguous axial images were obtained from the base of the skull through the vertex without contrast.  Comparison: Head CT 10/19/2007.  Findings: No definite acute intracranial abnormalities. Specifically, no definite signs of acute/subacute cerebral ischemia and no acute intracerebral hemorrhage. Additionally, there is no focal mass, mass effect, hydrocephalus or abnormal intra or extra- axial fluid collections.  Mild cerebral atrophy is age appropriate. No acute displaced skull fractures are identified.  Visualized paranasal sinuses and mastoids are well pneumatized.  IMPRESSION: 1.  No acute intracranial abnormalities. 2.  Mild cerebral atrophy.  These results will be called to the ordering clinician or representative by the Radiologist Assistant, and communication documented in the PACS Dashboard.  That These results were called by telephone on 12/06/2011 at 02:15 p.m. to nurse [employer]  in the ER (for Dr. Thad Ranger, who verbally acknowledged these results.   Original Report Authenticated By: Florencia Reasons, M.D.     EKG:  Pending  Assessment/Plan Active Problems:  TIA (transient ischemic attack) Given history of CAD and carotid stenosis in past with acute onset of work finding difficulty and confusion his symptoms are suggestive of TIA versus transient global medium. Admit to medical floor on telemetry on observation. Head CT on admission negative. We'll get CVA workup including MRI/MRA brain, 2-D echo, carotid Dopplers, lipid panel and hemoglobin A1c. Check EKG and labs. -He recently had a stress test done about 6 weeks back which was normal. -Appreciate neurology recommendations. Switch to aspirin to Plavix. -PT eval   Bacterial overgrowth syndrome Patient has been getting recurrent episodes of abdominal bloating with poor appetite over past few years. His wife informs that patient has lost almost 30 pounds in the last 18 months. He gets frequent episodes during the year which makes him extremely fatigued and is currently having a similar episode. Patient is on a selective diet at home and follows with GI in [facility]. We'll continue his current home and diet regimen. Continue PPI.   Hypertension Hold BP meds and allow permissive blood pressure.   H/O carotid artery stenosis Status post a right carotid stent 19 years back.   CAD (coronary artery disease) Stable. On aspirin and statin  DVT prophylaxis Subcutaneous Lovenox  Code Status: Full code Family Communication: Wife and daughter at bedside Disposition Plan: Home once stable  Eddie North [facility] Pager [phone]  If 7PM-7AM, please contact night-coverage [email or web address] Password [email or web address] 12/06/2011, 5:16 PM  Total time spent on admission 70 minutes

## 2011-12-06 NOTE — ED Notes (Signed)
Attempted to call report, secretary stated there was not a nurse available to take report at this time.

## 2011-12-06 NOTE — ED Notes (Signed)
Last seen normal 1305. Pt presents to ED via EMS with complaints of stroke like symptoms. Pt states he was helping a neighbor then he suddenly developed a headache and left sided facial droop and unable to dysphagia.  Symptoms have resolved on arrival to ED. Pt was taken straight to ct scan on arrival to ed.

## 2011-12-06 NOTE — ED Notes (Signed)
Code stroke documentation:  Code stroke encoded 1337, code stroke called 1331, patient arrival 103, edp exam 1354, stoke team arrival 1355, last seen normal 1305, pt arrival in ct 1355, phlebotomist arrival 1350, ct read by neurologist 1357.Alex KitchenMarland Oconnor

## 2011-12-06 NOTE — ED Provider Notes (Signed)
History     CSN: [id number]  Arrival date & time 12/06/11  1354   First MD Initiated Contact with Patient 12/06/11 1413      Chief Complaint  Patient presents with  . Code Stroke    (Consider location/radiation/quality/duration/timing/severity/associated sxs/prior treatment) HPI LEVEL 5 CAVEAT PERTAINS DUE TO URGENT NEED FOR INTERVENTION.  Pt presents as a code stroke notification.  Per EMS he developed headache and difficulty speaking. Upon arrival he is more able to communicate and feels that his symptoms are resolving.    History reviewed. No pertinent past medical history.  Past Surgical History  Procedure Date  . Carotid endarterectomy     History reviewed. No pertinent family history.  History  Substance Use Topics  . Smoking status: Not on file  . Smokeless tobacco: Not on file  . Alcohol Use:       Review of Systems ROS reviewed and all otherwise negative except for mentioned in HPI  Allergies  Penicillins  Home Medications   Current Outpatient Rx  Name Route Sig Dispense Refill  . BENAZEPRIL HCL 20 MG PO TABS Oral Take 20 mg by mouth daily.    Marland Kitchen BENAZEPRIL-HYDROCHLOROTHIAZIDE 20-25 MG PO TABS Oral Take 1 tablet by mouth daily.    Marland Kitchen VITAMIN D 1000 UNITS PO TABS Oral Take 1,000 Units by mouth daily.    Marland Kitchen FEXOFENADINE HCL 180 MG PO TABS Oral Take 180 mg by mouth daily.    . OSTEO BI-FLEX ADV TRIPLE ST PO Oral Take 1 tablet by mouth daily.    . ADULT MULTIVITAMIN W/MINERALS CH Oral Take 1 tablet by mouth daily.    Marland Kitchen OMEPRAZOLE-SODIUM BICARBONATE 40-1100 MG PO CAPS Oral Take 1 capsule by mouth daily before breakfast.    . SAW PALMETTO (SERENOA REPENS) 500 MG PO CAPS Oral Take 500 mg by mouth daily.    Marland Kitchen SIMVASTATIN 20 MG PO TABS Oral Take 20 mg by mouth every evening.    Marland Kitchen VITAMIN C 500 MG PO TABS Oral Take 500 mg by mouth daily.      BP 132/84  Pulse 57  Resp 17  Ht 5\' 8"  (1.727 m)  Wt 145 lb (65.772 kg)  BMI 22.05 kg/m2  SpO2 95% Vitals  reviewed Physical Exam Physical Examination: General appearance - alert, well appearing, and in no distress Mental status - alert, oriented to person, place, and time Eyes - PERRL, EOMI Mouth - mucous membranes moist, pharynx normal without lesions Heart - normal rate, regular rhythm, normal S1, S2, no murmurs, rubs, clicks or gallops Abdomen - soft, nontender, nondistended, no masses or organomegaly Neurological - alert and oriented x 3, cranial nerves 2-12, strength 5/5 in extremiites x 4, sensation intact, no aphasia, no facial droop Extremities - peripheral pulses normal, no pedal edema, no clubbing or cyanosis Skin - normal coloration and turgor, no rashes Psych- normal mood and affect  ED Course  Procedures (including critical care time)   Date: 12/06/2011  Rate: 68  Rhythm: normal sinus rhythm with first degree AV block  QRS Axis: normal  Intervals: PR prolonged  ST/T Wave abnormalities: nonspecific T wave changes  Conduction Disutrbances:first-degree A-V block   Narrative Interpretation:   Old EKG Reviewed: none available  CRITICAL CARE Performed by: Ethelda Chick   Total critical care time: 35  Critical care time was exclusive of separately billable procedures and treating other patients.  Critical care was necessary to treat or prevent imminent or life-threatening deterioration.  Critical care was time  spent personally by me on the following activities: development of treatment plan with patient and/or surrogate as well as nursing, discussions with consultants, evaluation of patient's response to treatment, examination of patient, obtaining history from patient or surrogate, ordering and performing treatments and interventions, ordering and review of laboratory studies, ordering and review of radiographic studies, pulse oximetry and re-evaluation of patient's condition.  4:35 PM  D/w [facility], for admission.  Pt to go to [facility], team 2, telemetry   Labs Reviewed   PROTIME-INR  APTT  CBC  DIFFERENTIAL  COMPREHENSIVE METABOLIC PANEL  TROPONIN I  URINE RAPID DRUG SCREEN (HOSP PERFORMED)   Ct Head Wo Contrast  12/06/2011  *RADIOLOGY REPORT*  Clinical Data: Slurred speech.  Code stroke.  CT HEAD WITHOUT CONTRAST  Technique:  Contiguous axial images were obtained from the base of the skull through the vertex without contrast.  Comparison: Head CT 10/19/2007.  Findings: No definite acute intracranial abnormalities. Specifically, no definite signs of acute/subacute cerebral ischemia and no acute intracerebral hemorrhage. Additionally, there is no focal mass, mass effect, hydrocephalus or abnormal intra or extra- axial fluid collections.  Mild cerebral atrophy is age appropriate. No acute displaced skull fractures are identified.  Visualized paranasal sinuses and mastoids are well pneumatized.  IMPRESSION: 1.  No acute intracranial abnormalities. 2.  Mild cerebral atrophy.  These results will be called to the ordering clinician or representative by the Radiologist Assistant, and communication documented in the PACS Dashboard.  That These results were called by telephone on 12/06/2011 at 02:15 p.m. to nurse Crystal in the ER (for Dr. Thad Ranger, who verbally acknowledged these results.   Original Report Authenticated By: Florencia Reasons, M.D.      1. Unspecified transient cerebral ischemia   2. Headache   3. Speech abnormality   4. TIA (transient ischemic attack)       MDM  Pt presenting as a code stroke notification with headache and aphasia. I evaluated him on arrival and he went to CT.  His symptoms were beginning to resolve on arrival and were resolved after CT scan,.  Normal neuro exam.  Was seen by Dr. Thad Ranger who requests admission to [facility] for TIA workup.         Ethelda Chick, MD 12/06/11 [phone]

## 2011-12-06 NOTE — Progress Notes (Signed)
Patient arrived via stretcher with wife. Walked to bed with no assistance. Patient placed on telemetry and used the restroom. Patient oriented to room and signed safety plan. Stroke swallow screen had not been documented properly, so swallow screen performed bedside and patient passed with no difficulties.

## 2011-12-06 NOTE — Consult Note (Addendum)
Referring Physician: Linker    Chief Complaint: Difficulty with speech, headache  HPI: Alex Oconnor is an 78 y.o. male who was helping a neighbor this morning.  He returned home for lunch and while attempting to eat developed a headache, then noted that he was unable to speak correctly.  At times he knew what he wanted to say but could not get the words out, then felt that his thoughts were getting jumbled up.  His wife took his blood pressure which was normal and EMS was called at that time.  Patient was brought in as a code stroke.   Headache is frontal and constant.  Initially a 10/10 but now a 3/10.  No nausea or vomiting.    LSN: [id number] Given: No: Resolution of symptoms  Past medical history: CAD, right carotid stenosis, HTN, hypercholesterolemia  Past Surgical History  Procedure Date  . Carotid endarterectomy     -   Back surgery         2012  History reviewed. No pertinent family history.  Social History:  No history of alcohol, tobacco or illicit drug abuse.  Married.  Is very active although retired.   Allergies: PCN  Medications: I have reviewed the patient's current medications. Prior to Admission:  ASA  ROS: History obtained from the patient  General ROS:  weight loss Psychological ROS: negative for - behavioral disorder, hallucinations, memory difficulties, mood swings or suicidal ideation Ophthalmic ROS: negative for - blurry vision, double vision, eye pain or loss of vision ENT ROS: negative for - epistaxis, nasal discharge, oral lesions, sore throat, tinnitus or vertigo Allergy and Immunology ROS: negative for - hives or itchy/watery eyes Hematological and Lymphatic ROS: negative for - bleeding problems, bruising or swollen lymph nodes Endocrine ROS: negative for - galactorrhea, hair pattern changes, polydipsia/polyuria or temperature intolerance Respiratory ROS: negative for - cough, hemoptysis, shortness of breath or wheezing Cardiovascular ROS: negative  for - chest pain, dyspnea on exertion, edema or irregular heartbeat Gastrointestinal ROS: gastroparesis Genito-Urinary ROS: negative for - dysuria, hematuria, incontinence or urinary frequency/urgency Musculoskeletal ROS: negative for - joint swelling or muscular weakness Neurological ROS: as noted in HPI Dermatological ROS: negative for rash and skin lesion changes  Physical Examination: Blood pressure 122/66, pulse 74, resp. rate 14, height 5\' 8"  (1.727 m), weight 65.772 kg (145 lb), SpO2 97.00%.  Neurologic Examination: Mental Status: Alert, oriented, thought content appropriate.  Speech fluent without evidence of aphasia.  Able to follow 3 step commands without difficulty. Cranial Nerves: II: Discs flat bilaterally; Visual fields grossly normal, pupils equal, round, reactive to light and accommodation III,IV, VI: ptosis not present, extra-ocular motions intact bilaterally V,VII: smile symmetric, facial light touch sensation normal bilaterally VIII: hearing normal bilaterally IX,X: gag reflex present XI: bilateral shoulder shrug XII: midline tongue extension Motor: Right : Upper extremity   5/5    Left:     Upper extremity   5/5  Lower extremity   5/5     Lower extremity   5/5 Tone and bulk:normal tone throughout; no atrophy noted Sensory: Pinprick and light touch intact throughout, bilaterally Deep Tendon Reflexes: 2+ in the upper extremities, 1+ at the knees and absent at the ankles Plantars: Right: downgoing   Left: downgoing Cerebellar: normal finger-to-nose, normal rapid alternating movements and normal heel-to-shin test CV: pulses palpable throughout   Laboratory Studies:  Basic Metabolic Panel: No results found for this basename: NA:5,K:5,CL:5,CO2:5,GLUCOSE:5,BUN:5,CREATININE:5,CALCIUM:3,MG:5,PHOS:5 in the last 168 hours  Liver Function Tests: No results  found for this basename: AST:5,ALT:5,ALKPHOS:5,BILITOT:5,PROT:5,ALBUMIN:5 in the last 168 hours No results found  for this basename: LIPASE:5,AMYLASE:5 in the last 168 hours No results found for this basename: AMMONIA:3 in the last 168 hours  CBC: No results found for this basename: WBC:5,NEUTROABS:5,HGB:5,HCT:5,MCV:5,PLT:5 in the last 168 hours  Cardiac Enzymes: No results found for this basename: CKTOTAL:5,CKMB:5,CKMBINDEX:5,TROPONINI:5 in the last 168 hours  BNP: No components found with this basename: POCBNP:5  CBG: No results found for this basename: GLUCAP:5 in the last 168 hours  Microbiology: Results for orders placed during the hospital encounter of 12/16/10  SURGICAL PCR SCREEN     Status: Normal   Collection Time   12/16/10 10:09 AM      Component Value Range Status Comment   MRSA, PCR NEGATIVE  NEGATIVE Final    Staphylococcus aureus NEGATIVE  NEGATIVE Final     Coagulation Studies: No results found for this basename: LABPROT:5,INR:5 in the last 72 hours  Urinalysis: No results found for this basename: COLORURINE:2,APPERANCEUR:2,LABSPEC:2,PHURINE:2,GLUCOSEU:2,HGBUR:2,BILIRUBINUR:2,KETONESUR:2,PROTEINUR:2,UROBILINOGEN:2,NITRITE:2,LEUKOCYTESUR:2 in the last 168 hours  Lipid Panel: No results found for this basename: chol, trig, hdl, cholhdl, vldl, ldlcalc    HgbA1C:  No results found for this basename: HGBA1C    Urine Drug Screen:   No results found for this basename: labopia, cocainscrnur, labbenz, amphetmu, thcu, labbarb    Alcohol Level: No results found for this basename: ETH:2 in the last 168 hours  Other results: EKG: NSR  Imaging: Ct Head Wo Contrast  12/06/2011  *RADIOLOGY REPORT*  Clinical Data: Slurred speech.  Code stroke.  CT HEAD WITHOUT CONTRAST  Technique:  Contiguous axial images were obtained from the base of the skull through the vertex without contrast.  Comparison: Head CT 10/19/2007.  Findings: No definite acute intracranial abnormalities. Specifically, no definite signs of acute/subacute cerebral ischemia and no acute intracerebral hemorrhage.  Additionally, there is no focal mass, mass effect, hydrocephalus or abnormal intra or extra- axial fluid collections.  Mild cerebral atrophy is age appropriate. No acute displaced skull fractures are identified.  Visualized paranasal sinuses and mastoids are well pneumatized.  IMPRESSION: 1.  No acute intracranial abnormalities. 2.  Mild cerebral atrophy.  These results will be called to the ordering clinician or representative by the Radiologist Assistant, and communication documented in the PACS Dashboard.  That These results were called by telephone on 12/06/2011 at 02:15 p.m. to nurse Crystal in the ER (for Dr. Thad Ranger, who verbally acknowledged these results.   Original Report Authenticated By: Florencia Reasons, M.D.     Assessment: 78 y.o. male presenting with headache and difficulty with speech.  Patient with multiple vascular risk factors.  CT unremarkable.  Symptoms have resolved completely and current NIHSS of 0.  Headache has now improved to a 3/10 severity.  Patient has had headaches in the past but not to this severity.  TIA on the differential but would rule out aneurysm, etc. as well with further imaging.   Patient has had a recent stress test but would need further cardiac testing and carotids to be evaluated.    Stroke Risk Factors - hyperlipidemia, hypertension and CAD  Plan: 1. HgbA1c, fasting lipid panel 2. MRI, MRA  of the brain without contrast 3. Echocardiogram 4. Carotid dopplers 5. Prophylactic therapy-After review of all medications would consider change from ASA to Plavix 75mg  daily.   7. Risk factor modification 8. Telemetry monitoring 9. Frequent neuro checks  Case discussed with Dr. Jimmey Ralph, MD [facility] [phone] 12/06/2011, 2:31 PM

## 2011-12-06 NOTE — Progress Notes (Signed)
PHARMACIST - PHYSICIAN ORDER COMMUNICATION  CONCERNING: P&T Medication Policy on Herbal Medications  DESCRIPTION:  This patient's order for: Osteo Bi-Flex and Saw Palmeto has been noted.  This product(s) is classified as an "herbal" or natural product. Due to a lack of definitive safety studies or FDA approval, nonstandard manufacturing practices, plus the potential risk of unknown drug-drug interactions while on inpatient medications, the Pharmacy and Therapeutics Committee does not permit the use of "herbal" or natural products of this type within [facility].   ACTION TAKEN: The pharmacy department is unable to verify this order at this time and your patient has been informed of this safety policy. Please reevaluate patient's clinical condition at discharge and address if the herbal or natural product(s) should be resumed at that time.   Thank you,  Harland German, Pharm D 12/06/2011 7:49 PM

## 2011-12-07 ENCOUNTER — Observation Stay (HOSPITAL_COMMUNITY): Payer: Medicare Other

## 2011-12-07 DIAGNOSIS — R51 Headache: Secondary | ICD-10-CM | POA: Diagnosis not present

## 2011-12-07 DIAGNOSIS — I10 Essential (primary) hypertension: Secondary | ICD-10-CM | POA: Diagnosis not present

## 2011-12-07 DIAGNOSIS — I2510 Atherosclerotic heart disease of native coronary artery without angina pectoris: Secondary | ICD-10-CM | POA: Diagnosis not present

## 2011-12-07 DIAGNOSIS — I6789 Other cerebrovascular disease: Secondary | ICD-10-CM | POA: Diagnosis not present

## 2011-12-07 DIAGNOSIS — I6529 Occlusion and stenosis of unspecified carotid artery: Secondary | ICD-10-CM | POA: Diagnosis not present

## 2011-12-07 DIAGNOSIS — R4789 Other speech disturbances: Secondary | ICD-10-CM | POA: Diagnosis not present

## 2011-12-07 DIAGNOSIS — G459 Transient cerebral ischemic attack, unspecified: Secondary | ICD-10-CM | POA: Diagnosis not present

## 2011-12-07 LAB — HEMOGLOBIN A1C: Mean Plasma Glucose: 117 mg/dL — ABNORMAL HIGH (ref <117)

## 2011-12-07 LAB — LIPID PANEL
HDL: 25 mg/dL — ABNORMAL LOW (ref >39)
LDL Cholesterol: 49 mg/dL (ref 0–99)
Triglycerides: 147 mg/dL (ref <150)
VLDL: 29 mg/dL (ref 0–40)

## 2011-12-07 LAB — POCT I-STAT, CHEM 8
BUN: 26 mg/dL — ABNORMAL HIGH (ref 6–23)
Calcium, Ion: 1.23 mmol/L (ref 1.13–1.30)
Glucose, Bld: 131 mg/dL — ABNORMAL HIGH (ref 70–99)
TCO2: 24 mmol/L (ref 0–100)

## 2011-12-07 LAB — POCT I-STAT TROPONIN I: Troponin i, poc: 0 ng/mL (ref 0.00–0.08)

## 2011-12-07 NOTE — Progress Notes (Signed)
Utilization Review Completed.  

## 2011-12-07 NOTE — Progress Notes (Signed)
Subjective: Patient reports that he has had no recurrence of symptoms.  Feels he is at baseline.  Has no headache.    Objective: Current vital signs: BP 114/66  Pulse 60  Temp 97.8 F (36.6 C) (Oral)  Resp 16  Ht 5\' 8"  (1.727 m)  Wt 67.132 kg (148 lb)  BMI 22.50 kg/m2  SpO2 96% Vital signs in last 24 hours: Temp:  [97.3 F (36.3 C)-97.8 F (36.6 C)] 97.8 F (36.6 C) (10/13 0732) Pulse Rate:  [50-74] 60  (10/13 0732) Resp:  [13-20] 16  (10/13 0732) BP: (114-139)/(46-107) 114/66 mmHg (10/13 0732) SpO2:  [95 %-100 %] 96 % (10/13 0732) Weight:  [65.772 kg (145 lb)-67.132 kg (148 lb)] 67.132 kg (148 lb) (10/12 2100)  Intake/Output from previous day:   Intake/Output this shift:   Nutritional status: Gluten Restricted  Neurologic Exam: Mental Status: Alert, oriented, thought content appropriate.  Speech fluent without evidence of aphasia.  Able to follow 3 step commands without difficulty. Cranial Nerves: II: Discs flat bilaterally; Visual fields grossly normal, pupils equal, round, reactive to light and accommodation III,IV, VI: ptosis not present, extra-ocular motions intact bilaterally V,VII: smile symmetric, facial light touch sensation normal bilaterally VIII: hearing normal bilaterally IX,X: gag reflex present XI: bilateral shoulder shrug XII: midline tongue extension Motor: Right : Upper extremity   5/5    Left:     Upper extremity   5/5  Lower extremity   5/5     Lower extremity   5/5 Tone and bulk:normal tone throughout; no atrophy noted Sensory: Pinprick and light touch intact throughout, bilaterally Deep Tendon Reflexes: 2+ in the upper extremities, 1+ at the knees and absent at the ankles Plantars: Right: downgoing   Left: downgoing Cerebellar: normal finger-to-nose and normal heel-to-shin test Gait: normal gait and station CV: pulses palpable throughout   Lab Results: Basic Metabolic Panel:  Lab 12/06/11 1610  NA 137  K 4.2  CL 102  CO2 25  GLUCOSE  134*  BUN 27*  CREATININE 1.04  CALCIUM 9.7  MG --  PHOS --    Liver Function Tests:  Lab 12/06/11 1526  AST 18  ALT 13  ALKPHOS 63  BILITOT 0.3  PROT 6.8  ALBUMIN 3.8   No results found for this basename: LIPASE:5,AMYLASE:5 in the last 168 hours No results found for this basename: AMMONIA:3 in the last 168 hours  CBC:  Lab 12/06/11 1526  WBC 7.7  NEUTROABS 5.1  HGB 12.6*  HCT 36.4*  MCV 89.0  PLT 228    Cardiac Enzymes:  Lab 12/06/11 1527  CKTOTAL --  CKMB --  CKMBINDEX --  TROPONINI <0.30    Lipid Panel: No results found for this basename: CHOL:5,TRIG:5,HDL:5,CHOLHDL:5,VLDL:5,LDLCALC:5 in the last 168 hours  CBG:  Lab 12/06/11 1958  GLUCAP 147*    Microbiology: Results for orders placed during the hospital encounter of 12/16/10  SURGICAL PCR SCREEN     Status: Normal   Collection Time   12/16/10 10:09 AM      Component Value Range Status Comment   MRSA, PCR NEGATIVE  NEGATIVE Final    Staphylococcus aureus NEGATIVE  NEGATIVE Final     Coagulation Studies:  Basename 12/06/11 1526  LABPROT 13.5  INR 1.04    Imaging: Ct Head Wo Contrast  12/06/2011  *RADIOLOGY REPORT*  Clinical Data: Slurred speech.  Code stroke.  CT HEAD WITHOUT CONTRAST  Technique:  Contiguous axial images were obtained from the base of the skull through the vertex  without contrast.  Comparison: Head CT 10/19/2007.  Findings: No definite acute intracranial abnormalities. Specifically, no definite signs of acute/subacute cerebral ischemia and no acute intracerebral hemorrhage. Additionally, there is no focal mass, mass effect, hydrocephalus or abnormal intra or extra- axial fluid collections.  Mild cerebral atrophy is age appropriate. No acute displaced skull fractures are identified.  Visualized paranasal sinuses and mastoids are well pneumatized.  IMPRESSION: 1.  No acute intracranial abnormalities. 2.  Mild cerebral atrophy.  These results will be called to the ordering  clinician or representative by the Radiologist Assistant, and communication documented in the PACS Dashboard.  That These results were called by telephone on 12/06/2011 at 02:15 p.m. to nurse Crystal in the ER (for Dr. Thad Ranger, who verbally acknowledged these results.   Original Report Authenticated By: Florencia Reasons, M.D.     Medications:  I have reviewed the patient's current medications. Scheduled:   . cholecalciferol  1,000 Units Oral Daily  . clopidogrel  75 mg Oral Q breakfast  . enoxaparin  40 mg Subcutaneous Q24H  . multivitamin with minerals  1 tablet Oral Daily  . simvastatin  20 mg Oral QPM  . vitamin C  500 mg Oral Daily  . DISCONTD: Osteo Bi-Flex Adv Triple St  1 tablet Oral Daily  . DISCONTD: saw palmetto  500 mg Oral Daily    Assessment/Plan:  Patient Active Hospital Problem List: TIA (transient ischemic attack) (12/06/2011)   Assessment: Patient at baseline.  With vascular risk factors TIA is on the differential for the etiology of the patient's event.  Placed on Plavix.  TIA work up pending.   Plan:  1.  Will f/u TIA work up and make further recommendations based on findings.      LOS: 1 day   Thana Farr, MD [facility] [phone] 12/07/2011  7:38 AM

## 2011-12-07 NOTE — Evaluation (Signed)
Physical Therapy Evaluation Patient Details Name: Alex Oconnor MRN: [id number] DOB: 06-Jul-1932 Today's Date: 12/07/2011 Time: 1121-1129 PT Time Calculation (min): 8 min  PT Assessment / Plan / Recommendation Clinical Impression  Pt admitted with stroke like symptoms, all symptoms currently resolved. Pt is at his baselien functional level, no acute PT needs. Will not follow    PT Assessment  Patent does not need any further PT services    Follow Up Recommendations  No PT follow up    Does the patient have the potential to tolerate intense rehabilitation      Barriers to Discharge        Equipment Recommendations  None recommended by PT    Recommendations for Other Services     Frequency      Precautions / Restrictions Precautions Precautions: None Restrictions Weight Bearing Restrictions: No   Pertinent Vitals/Pain No pain complaints.       Mobility  Bed Mobility Bed Mobility: Supine to Sit;Sitting - Scoot to Edge of Bed;Sit to Supine Supine to Sit: 7: Independent Sitting - Scoot to Edge of Bed: 7: Independent Sit to Supine: 7: Independent Transfers Transfers: Sit to Stand;Stand to Sit Sit to Stand: 7: Independent Stand to Sit: 7: Independent Ambulation/Gait Ambulation/Gait Assistance: 7: Independent Ambulation Distance (Feet): 200 Feet Assistive device: None Ambulation/Gait Assistance Details: Normal gait pattern Gait Pattern: Within Functional Limits Gait velocity: normal gait speed Stairs: Yes Stairs Assistance: 7: Independent Stair Management Technique: No rails;Alternating pattern;Forwards Number of Stairs: 12  Modified Rankin (Stroke Patients Only) Pre-Morbid Rankin Score: No symptoms Modified Rankin: No symptoms    Shoulder Instructions     Exercises     PT Diagnosis:    PT Problem List:   PT Treatment Interventions:     PT Goals    Visit Information  Last PT Received On: 12/07/11 Assistance Needed: +1    Subjective Data        Prior Functioning  Home Living Lives With: Spouse Available Help at Discharge: Family;Available 24 hours/day Type of Home: House Home Access: Stairs to enter [employer] of Steps: 3 Entrance Stairs-Rails: None Home Layout: Two level Alternate Level Stairs-Number of Steps: 12 Alternate Level Stairs-Rails: Left;Right;Can reach both Bathroom Shower/Tub: Walk-in shower;Door [employer] Toilet: Handicapped height Bathroom Accessibility: Yes How Accessible: Accessible via walker Home Adaptive Equipment: Walker - rolling Prior Function Level of Independence: Independent Able to Take Stairs?: Yes Driving: Yes Vocation: Retired Comments: Chief Executive Officer: No difficulties Dominant Hand: Right    Cognition  Overall Cognitive Status: Appears within functional limits for tasks assessed/performed Arousal/Alertness: Awake/alert Orientation Level: Appears intact for tasks assessed Behavior During Session: [facility] for tasks performed    Extremity/Trunk Assessment Right Lower Extremity Assessment RLE ROM/Strength/Tone: Within functional levels RLE Sensation: WFL - Light Touch RLE Coordination: WFL - gross/fine motor Left Lower Extremity Assessment LLE ROM/Strength/Tone: Within functional levels LLE Sensation: WFL - Light Touch LLE Coordination: WFL - gross/fine motor   Balance    End of Session PT - End of Session Activity Tolerance: Patient tolerated treatment well Patient left: in bed;with call bell/phone within reach;with family/visitor present Nurse Communication: Mobility status  GP Functional Assessment Tool Used: clinical judgement Functional Limitation: Mobility: Walking and moving around Mobility: Walking and Moving Around Current Status [phone]): 0 percent impaired, limited or restricted Mobility: Walking and Moving Around Goal Status [phone]): 0 percent impaired, limited or restricted Mobility: Walking and Moving Around Discharge Status [phone]): 0  percent impaired, limited or restricted   [place], Richelle Ito  12/07/2011, 1:48 PM  12/07/2011 Milana Kidney DPT PAGER: [phone] OFFICE: [phone]

## 2011-12-07 NOTE — Progress Notes (Signed)
  Echocardiogram 2D Echocardiogram has been performed.  Alex Oconnor 12/07/2011, 2:39 PM

## 2011-12-07 NOTE — Progress Notes (Signed)
VASCULAR LAB PRELIMINARY  PRELIMINARY  PRELIMINARY  PRELIMINARY  Carotid Dopplers completed.    Preliminary report:  40-59% ICA stenosis, highest end of scale, bilaterally.  The right CEA remains patent.  Vertebral artery flow is antegrade, bilaterally.  Alex Oconnor, 12/07/2011, 10:39 AM

## 2011-12-07 NOTE — Progress Notes (Signed)
Subjective: No complaints  Objective: Vital signs in last 24 hours: Temp:  [97.3 F (36.3 C)-97.8 F (36.6 C)] 97.8 F (36.6 C) (10/13 0732) Pulse Rate:  [50-74] 60  (10/13 0732) Resp:  [13-20] 16  (10/13 0732) BP: (114-139)/(46-107) 114/66 mmHg (10/13 0732) SpO2:  [95 %-100 %] 96 % (10/13 0732) Weight:  [65.772 kg (145 lb)-67.132 kg (148 lb)] 67.132 kg (148 lb) (10/12 2100) Weight change:  Last BM Date: 12/06/11  Intake/Output from previous day:   Intake/Output this shift:    General appearance: alert and cooperative Resp: clear to auscultation bilaterally Cardio: regular rate and rhythm, S1, S2 normal, no murmur, click, rub or gallop, no carotid bruits GI: soft, non-tender; bowel sounds normal; no masses,  no organomegaly Extremities: extremities normal, atraumatic, no cyanosis or edema Neurologic: Alert and oriented X 3, normal strength and tone. Normal symmetric reflexes. Normal coordination and gait  Lab Results:  Basename 12/06/11 1526  WBC 7.7  HGB 12.6*  HCT 36.4*  PLT 228   BMET  Basename 12/06/11 1526  NA 137  K 4.2  CL 102  CO2 25  GLUCOSE 134*  BUN 27*  CREATININE 1.04  CALCIUM 9.7    Studies/Results: Ct Head Wo Contrast  12/06/2011  *RADIOLOGY REPORT*  Clinical Data: Slurred speech.  Code stroke.  CT HEAD WITHOUT CONTRAST  Technique:  Contiguous axial images were obtained from the base of the skull through the vertex without contrast.  Comparison: Head CT 10/19/2007.  Findings: No definite acute intracranial abnormalities. Specifically, no definite signs of acute/subacute cerebral ischemia and no acute intracerebral hemorrhage. Additionally, there is no focal mass, mass effect, hydrocephalus or abnormal intra or extra- axial fluid collections.  Mild cerebral atrophy is age appropriate. No acute displaced skull fractures are identified.  Visualized paranasal sinuses and mastoids are well pneumatized.  IMPRESSION: 1.  No acute intracranial  abnormalities. 2.  Mild cerebral atrophy.  These results will be called to the ordering clinician or representative by the Radiologist Assistant, and communication documented in the PACS Dashboard.  That These results were called by telephone on 12/06/2011 at 02:15 p.m. to nurse Crystal in the ER (for Dr. Thad Ranger, who verbally acknowledged these results.   Original Report Authenticated By: Florencia Reasons, M.D.     Medications: I have reviewed the patient's current medications.  Assessment/Plan: Active Problems:  TIA (transient ischemic attack) symptoms resolved.  Now on plavix.  Usual TIA workup ordered  Hypertension BP OK  BP meds on hold  H/O carotid artery stenosis  CAD (coronary artery disease) stable   LOS: 1 day   Alex Oconnor JOSEPH 12/07/2011, 9:11 AM

## 2011-12-08 DIAGNOSIS — I2510 Atherosclerotic heart disease of native coronary artery without angina pectoris: Secondary | ICD-10-CM | POA: Diagnosis not present

## 2011-12-08 DIAGNOSIS — I6529 Occlusion and stenosis of unspecified carotid artery: Secondary | ICD-10-CM | POA: Diagnosis not present

## 2011-12-08 DIAGNOSIS — I10 Essential (primary) hypertension: Secondary | ICD-10-CM | POA: Diagnosis not present

## 2011-12-08 DIAGNOSIS — G459 Transient cerebral ischemic attack, unspecified: Secondary | ICD-10-CM | POA: Diagnosis not present

## 2011-12-08 LAB — BASIC METABOLIC PANEL
BUN: 24 mg/dL — ABNORMAL HIGH (ref 6–23)
Chloride: 105 mEq/L (ref 96–112)
Creatinine, Ser: 0.9 mg/dL (ref 0.50–1.35)
GFR calc Af Amer: >90 mL/min (ref >90)
Glucose, Bld: 97 mg/dL (ref 70–99)

## 2011-12-08 MED ORDER — CLOPIDOGREL BISULFATE 75 MG PO TABS: 75.0000 mg | ORAL_TABLET | Freq: Every day | ORAL | Status: DC

## 2011-12-08 NOTE — Progress Notes (Signed)
[facility] PROGRESS NOTE    SUBJECTIVE   No complaints.  No further HA or speech difficulty.   OBJECTIVE   Vital signs in last 24 hours: Temp:  [97.5 F (36.4 C)-98.5 F (36.9 C)] 97.9 F (36.6 C) (10/14 0600) Pulse Rate:  [52-66] 52  (10/14 0600) Resp:  [16-20] 16  (10/14 0600) BP: (100-129)/(57-64) 119/61 mmHg (10/14 0600) SpO2:  [97 %-99 %] 99 % (10/14 0600)  Intake/Output from previous day:   Intake/Output this shift:   Nutritional status: Gluten Restricted  Past Medical History  Diagnosis Date  . Hypertension     Neurologic ROS negative with exception of above. Musculoskeletal ROS none  Neurologic Exam:  Mental Status: Alert, oriented, thought content appropriate.  Speech fluent without evidence of aphasia.  Able to follow 3 step commands without difficulty. Cranial Nerves: II: Discs flat bilaterally; Visual fields grossly normal, pupils equal, round, reactive to light and accommodation III,IV, VI: ptosis not present, extra-ocular motions intact bilaterally V,VII: smile symmetric, facial light touch sensation normal bilaterally VIII: hearing normal bilaterally IX,X: gag reflex present XI: bilateral shoulder shrug XII: midline tongue extension Motor: Right : Upper extremity   5/5    Left:     Upper extremity   5/5  Lower extremity   5/5     Lower extremity   5/5 Tone and bulk:normal tone throughout; no atrophy noted Sensory: Pinprick and light touch intact throughout, bilaterally Deep Tendon Reflexes: 2+ UE, 1+ KJ, no AJ Plantars: Right: downgoing   Left: downgoing Cerebellar: normal finger-to-nose,  normal heel-to-shin test Gait: steady CV: pulses palpable throughout     Lab Results: Lab Results  Component Value Date/Time   CHOL 103 12/07/2011  7:15 AM   Lipid Panel  Basename 12/07/11 0715  CHOL 103  TRIG 147  HDL 25*  CHOLHDL 4.1  VLDL 29  LDLCALC 49   A1c-5.7  Echo- Left ventricle:  The cavity size was normal. Systolic function was normal. The estimated ejection fraction was in the range of 60% to 65%. Wall motion was normal; there were no regional wall motion abnormalities. Doppler parameters are consistent with abnormal left ventricular relaxation (grade 1 diastolic dysfunction).  Impressions:  - No cardiac source of emboli was indentified.  Carotids--40-59% stenosis bilaterally  Studies/Results: Ct Head Wo Contrast  12/06/2011  *RADIOLOGY REPORT*  Clinical Data: Slurred speech.  Code stroke.  CT HEAD WITHOUT CONTRAST  Technique:  Contiguous axial images were obtained from the base of the skull through the vertex without contrast.  Comparison: Head CT 10/19/2007.  Findings: No definite acute intracranial abnormalities. Specifically, no definite signs of acute/subacute cerebral ischemia and no acute intracerebral hemorrhage. Additionally, there is no focal mass, mass effect, hydrocephalus or abnormal intra or extra- axial fluid collections.  Mild cerebral atrophy is age appropriate. No acute displaced skull fractures are identified.  Visualized paranasal sinuses and mastoids are well pneumatized.  IMPRESSION: 1.  No acute intracranial abnormalities. 2.  Mild cerebral atrophy.  These results will be called to the ordering clinician or representative by the Radiologist Assistant, and communication documented in the PACS Dashboard.  That These results were called by telephone on 12/06/2011 at 02:15 p.m. to nurse Crystal in the ER (for Dr. Thad Ranger, who verbally acknowledged these results.  Original Report Authenticated By: Florencia Reasons, M.D.        Mr Mra Head/brain Wo Cm  12/07/2011  *RADIOLOGY REPORT*  Clinical Data:  Stroke.  15-minute episode yesterday involving difficulty in finding words and headache.  The symptoms have since resolved patient.  MRI HEAD WITHOUT CONTRAST MRA HEAD WITHOUT CONTRAST  Technique:  Multiplanar, multiecho pulse sequences of the brain  and surrounding structures were obtained without intravenous contrast. Angiographic images of the head were obtained using MRA technique without contrast.  Comparison:  CT head without contrast 12/06/2011.  MRI HEAD  Findings:  The diffusion weighted images demonstrate no evidence for acute or subacute infarction.  Degenerative changes are evident in the upper cervical spine with significant disc disease at C3-4 and C4-5.  Midline structures are otherwise unremarkable.  Mild generalized atrophy is present.  Scattered periventricular and subcortical T2 and FLAIR hyperintensities are greater than expected for age.  There is focal white matter disease in the central pons as well.  No hemorrhage or mass lesion is present.  The ventricles are normal size.  No significant extra-axial fluid collection is present.  Flow is present in the major intracranial arteries.  The globes and orbits are intact.  The paranasal sinuses and mastoid air cells are clear.  IMPRESSION:  1.  No acute intracranial abnormality. 2.  Stable atrophy. 3.  White matter disease is slightly greater than expected for age. The finding is nonspecific but can be seen in the setting of chronic microvascular ischemia, a demyelinating process such as multiple sclerosis, vasculitis, complicated migraine headaches, or as the sequelae of a prior infectious or inflammatory process. 4.  Degenerative changes in the upper cervical spine.  MRA HEAD  Findings: The internal carotid arteries are within normal limits from high cervical segments through the ICA termini bilaterally. The A1 and M1 segments are normal.  No definite anterior communicating artery is identified.  The MCA bifurcations are within normal limits bilaterally.  The ACA and MCA branch vessels are normal.  The vertebral arteries are codominant.  The PICA origins are visualized and normal bilaterally.  The basilar artery is within normal limits.  The posterior cerebral arteries originate from basilar  tip.  The PCA branch vessels are normal.  IMPRESSION: Normal variant MRA circle of Willis without evidence for significant proximal stenosis, aneurysm, or branch vessel occlusion.   Original Report Authenticated By: Jamesetta Orleans. MATTERN, M.D.     Medications:     Scheduled:   . cholecalciferol  1,000 Units Oral Daily  . clopidogrel  75 mg Oral Q breakfast  . enoxaparin  40 mg Subcutaneous Q24H  . multivitamin with minerals  1 tablet Oral Daily  . simvastatin  20 mg Oral QPM  . vitamin C  500 mg Oral Daily    Assessment/Plan:    Patient Active Hospital Problem List: TIA (transient ischemic attack) (12/06/2011)   Assessment: Back to baseline.  TIA workup essentially negative.     Plan: Continue Plavix and F/U with PCP as out patient.    Neuro will S/O   Felicie Morn PA-C [facility] [phone]  12/08/2011, 8:52 AM

## 2011-12-08 NOTE — Discharge Summary (Signed)
Physician Discharge Summary  Patient ID: Alex Oconnor MRN: [id number] DOB/AGE: Apr 12, 1932 78 y.o.  Admit date: 12/06/2011 Discharge date: 12/08/2011  Admission Diagnoses: TIA Hypertension Coronary artery disease   Discharge Diagnoses:  Active Problems:  TIA (transient ischemic attack)  Hypertension  Moderate carotid artery stenosis  CAD (coronary artery disease)   Discharged Condition: good  Hospital Course: The patient was admitted on October 12 after a brief approximately 20 minute episode of word finding difficulty confusion while having lunch. The patient [place] to say but could not verbalize the words. He denied any visual change, dizziness or weakness in an arm or leg. No chest pain, shortness of breath or palpitations. He did have a brief headache prior to the episode. In the ER his CAT scan of the brain was unremarkable. Lab workup was unremarkable. He was admitted for a possible TIA. He was seen by the neurology service who felt the patient had a transient ischemic attack and recommended stroke workup. The patient was switched from aspirin to Plavix. MRI of the brain showed no evidence of stroke and stable atrophy and significant white matter  disease. Carotid ultrasound showed right carotid endarterectomy site was patent and a 40-60% stenosis in the left internal carotid artery. Echocardiogram showed normal ejection fraction and no sign of embolic source. The patient and no further neurologic episodes during hospitalization. He was seen by physical therapy and physical therapy follow was not recommended. His hemoglobin A1c was 5.7 and Lipitor for a well-controlled. His blood pressure systolic ranged between 110 and 120 off of his medications and Lotensin HCT were held at discharge, patient will follow his blood pressure twice a day and call me with up in 3 days.  Consults: neurology  Significant Diagnostic Studies: labs: As above, radiology: MRI: As above  and CT scan: As  above and cardiac graphics: Echocardiogram: As above and Carotid ultrasound, as above  Treatments: anticoagulation: Plavix  Discharge Exam: Blood pressure 119/61, pulse 52, temperature 97.9 F (36.6 C), temperature source Oral, resp. rate 16, height 5\' 8"  (1.727 m), weight 67.132 kg (148 lb), SpO2 99.00%. Resp: clear to auscultation bilaterally Cardio: regular rate and rhythm, S1, S2 normal, no murmur, click, rub or gallop  Disposition: 01-Home or Self Care     Medication List     As of 12/08/2011  7:30 AM    STOP taking these medications         benazepril 20 MG tablet   Commonly known as: LOTENSIN      benazepril-hydrochlorthiazide 20-25 MG per tablet   Commonly known as: LOTENSIN HCT      TAKE these medications         cholecalciferol 1000 UNITS tablet   Commonly known as: VITAMIN D   Take 1,000 Units by mouth daily.      clopidogrel 75 MG tablet   Commonly known as: PLAVIX   Take 1 tablet (75 mg total) by mouth daily with breakfast.      fexofenadine 180 MG tablet   Commonly known as: ALLEGRA   Take 180 mg by mouth daily.      multivitamin with minerals Tabs   Take 1 tablet by mouth daily.      omeprazole-sodium bicarbonate 40-1100 MG per capsule   Commonly known as: ZEGERID   Take 1 capsule by mouth daily before breakfast.      OSTEO BI-FLEX ADV TRIPLE ST PO   Take 1 tablet by mouth daily.      saw palmetto  500 MG capsule   Take 500 mg by mouth daily.      simvastatin 20 MG tablet   Commonly known as: ZOCOR   Take 20 mg by mouth every evening.      vitamin C 500 MG tablet   Commonly known as: ASCORBIC ACID   Take 500 mg by mouth daily.           Follow-up Information    Follow up with Lillia Mountain, MD. Today.   Contact information:   [address], [address] [place]nie Crum[place] [address] [phone]          Signed: Lillia Mountain 12/08/2011, 7:30 AM

## 2012-01-12 DIAGNOSIS — R1310 Dysphagia, unspecified: Secondary | ICD-10-CM | POA: Diagnosis not present

## 2012-01-12 DIAGNOSIS — R634 Abnormal weight loss: Secondary | ICD-10-CM | POA: Diagnosis not present

## 2012-01-12 DIAGNOSIS — D130 Benign neoplasm of esophagus: Secondary | ICD-10-CM | POA: Diagnosis not present

## 2012-01-15 DIAGNOSIS — R070 Pain in throat: Secondary | ICD-10-CM | POA: Diagnosis not present

## 2012-01-15 DIAGNOSIS — E785 Hyperlipidemia, unspecified: Secondary | ICD-10-CM | POA: Diagnosis not present

## 2012-01-16 DIAGNOSIS — E785 Hyperlipidemia, unspecified: Secondary | ICD-10-CM | POA: Diagnosis not present

## 2012-01-16 DIAGNOSIS — R070 Pain in throat: Secondary | ICD-10-CM | POA: Diagnosis not present

## 2012-01-20 DIAGNOSIS — Z1331 Encounter for screening for depression: Secondary | ICD-10-CM | POA: Diagnosis not present

## 2012-01-20 DIAGNOSIS — Z0000 Encounter for general adult medical examination without abnormal findings: Secondary | ICD-10-CM | POA: Diagnosis not present

## 2012-02-02 ENCOUNTER — Encounter (HOSPITAL_BASED_OUTPATIENT_CLINIC_OR_DEPARTMENT_OTHER): Payer: Self-pay | Admitting: *Deleted

## 2012-02-02 ENCOUNTER — Emergency Department (HOSPITAL_BASED_OUTPATIENT_CLINIC_OR_DEPARTMENT_OTHER)
Admission: EM | Admit: 2012-02-02 | Discharge: 2012-02-02 | Disposition: A | Discharge status: Home / Self Care | Payer: Medicare Other | Attending: Emergency Medicine | Admitting: Emergency Medicine

## 2012-02-02 DIAGNOSIS — K5289 Other specified noninfective gastroenteritis and colitis: Secondary | ICD-10-CM | POA: Diagnosis not present

## 2012-02-02 DIAGNOSIS — K529 Noninfective gastroenteritis and colitis, unspecified: Secondary | ICD-10-CM

## 2012-02-02 DIAGNOSIS — Z87891 Personal history of nicotine dependence: Secondary | ICD-10-CM | POA: Diagnosis not present

## 2012-02-02 DIAGNOSIS — R112 Nausea with vomiting, unspecified: Secondary | ICD-10-CM | POA: Diagnosis not present

## 2012-02-02 DIAGNOSIS — R51 Headache: Secondary | ICD-10-CM | POA: Diagnosis not present

## 2012-02-02 DIAGNOSIS — Z79899 Other long term (current) drug therapy: Secondary | ICD-10-CM | POA: Insufficient documentation

## 2012-02-02 DIAGNOSIS — I10 Essential (primary) hypertension: Secondary | ICD-10-CM | POA: Insufficient documentation

## 2012-02-02 DIAGNOSIS — Z8673 Personal history of transient ischemic attack (TIA), and cerebral infarction without residual deficits: Secondary | ICD-10-CM | POA: Diagnosis not present

## 2012-02-02 HISTORY — DX: Transient cerebral ischemic attack, unspecified: G45.9

## 2012-02-02 LAB — BASIC METABOLIC PANEL
BUN: 23 mg/dL (ref 6–23)
CO2: 23 mEq/L (ref 19–32)
Chloride: 102 mEq/L (ref 96–112)
GFR calc non Af Amer: 80 mL/min — ABNORMAL LOW (ref >90)
Glucose, Bld: 108 mg/dL — ABNORMAL HIGH (ref 70–99)
Potassium: 4.1 mEq/L (ref 3.5–5.1)
Sodium: 139 mEq/L (ref 135–145)

## 2012-02-02 LAB — CBC
HCT: 41.9 % (ref 39.0–52.0)
Hemoglobin: 14.8 g/dL (ref 13.0–17.0)
MCH: 29.8 pg (ref 26.0–34.0)
MCHC: 35.3 g/dL (ref 30.0–36.0)
MCV: 84.3 fL (ref 78.0–100.0)
RBC: 4.97 MIL/uL (ref 4.22–5.81)

## 2012-02-02 MED ORDER — SODIUM CHLORIDE 0.9 % IV BOLUS (SEPSIS)
1000.0000 mL | Freq: Once | INTRAVENOUS | Status: AC
  Administered 2012-02-02: 1000 mL via INTRAVENOUS

## 2012-02-02 NOTE — ED Provider Notes (Signed)
I saw and evaluated the patient, reviewed the resident's note and I agree with the findings and plan.   .Face to face Exam:  General:  Awake HEENT:  Atraumatic Resp:  Normal effort Abd:  Nondistended Neuro:No focal weakness Lymph: No adenopathy   Nelia Shi, MD 02/02/12 1549

## 2012-02-02 NOTE — ED Notes (Signed)
Diarrhea vomiting and headache all night.

## 2012-02-02 NOTE — ED Provider Notes (Signed)
History     CSN: [id number]  Arrival date & time 02/02/12  1255   First MD Initiated Contact with Patient 02/02/12 1432      Chief Complaint  Patient presents with  . Diarrhea    (Consider location/radiation/quality/duration/timing/severity/associated sxs/prior treatment) The history is provided by the patient and the spouse.   78yo M with a history of gastroparesis presents to the ED with nausea, vomiting, diarrhea, and headache.  His symptoms began at 3am this morning. He does endorse eating at [employer] yesterday and his wife states that he did over eat. His last episode of diarrhea was around 12pm today prior to coming into the hospital. He denies any blood or mucus in his stool. Of note, his wife had an episode of emesis last night as well.   Past Medical History  Diagnosis Date  . Hypertension   . TIA (transient ischemic attack)     Past Surgical History  Procedure Date  . Carotid endarterectomy   . Back surgery     No family history on file.  History  Substance Use Topics  . Smoking status: Former Smoker    Quit date: 11/24/1980  . Smokeless tobacco: Not on file  . Alcohol Use: No      Review of Systems  All other systems reviewed and are negative.    Allergies  Penicillins  Home Medications   Current Outpatient Rx  Name  Route  Sig  Dispense  Refill  . VITAMIN D 1000 UNITS PO TABS   Oral   Take 1,000 Units by mouth daily.         Marland Kitchen CLOPIDOGREL BISULFATE 75 MG PO TABS   Oral   Take 1 tablet (75 mg total) by mouth daily with breakfast.   30 tablet   11   . FEXOFENADINE HCL 180 MG PO TABS   Oral   Take 180 mg by mouth daily.         . OSTEO BI-FLEX ADV TRIPLE ST PO   Oral   Take 1 tablet by mouth daily.         . ADULT MULTIVITAMIN W/MINERALS CH   Oral   Take 1 tablet by mouth daily.         Marland Kitchen OMEPRAZOLE-SODIUM BICARBONATE 40-1100 MG PO CAPS   Oral   Take 1 capsule by mouth daily before breakfast.         . SAW  PALMETTO (SERENOA REPENS) 500 MG PO CAPS   Oral   Take 500 mg by mouth daily.         Marland Kitchen SIMVASTATIN 20 MG PO TABS   Oral   Take 20 mg by mouth every evening.         Marland Kitchen VITAMIN C 500 MG PO TABS   Oral   Take 500 mg by mouth daily.           BP 131/82  Pulse 93  Temp 97.5 F (36.4 C) (Oral)  Resp 20  SpO2 99%  Physical Exam  Constitutional: He is oriented to person, place, and time. No distress.  HENT:  Head: Normocephalic and atraumatic.  Eyes: EOM are normal. Pupils are equal, round, and reactive to light.  Neck: Neck supple.  Cardiovascular: Normal rate and regular rhythm.   Pulmonary/Chest: Effort normal and breath sounds normal.  Abdominal: Soft. Bowel sounds are normal. He exhibits no distension and no mass. There is no tenderness. There is no guarding.  Musculoskeletal: He exhibits no edema  and no tenderness.  Neurological: He is alert and oriented to person, place, and time. No cranial nerve deficit.  Skin: Skin is warm and dry.  Psychiatric: He has a normal mood and affect. His behavior is normal.    ED Course  Procedures (including critical care time)  Labs Reviewed  BASIC METABOLIC PANEL - Abnormal; Notable for the following:    Glucose, Bld 108 (*)     GFR calc non Af Amer 80 (*)     All other components within normal limits  CBC - Abnormal; Notable for the following:    WBC 15.0 (*)     All other components within normal limits   No results found.   1. Gastroenteritis       MDM    78yo M with PMH gastroparesis presents with nausea, vomiting, and diarrhea for less than 12 hours. His wife also experienced one episode of nausea last night. His symptoms are likely related to gastritis associated with his dinner yesterday, as he and his wife were symptomatic. WBC count elevated to 15.0. He was given a 1L bolus of NS in the ED. He was advised to follow a BRAT diet at least for the next day until tolerating a regular diet.        Genelle Gather, MD 02/02/12 1548  Genelle Gather, MD 02/02/12 [phone]

## 2012-02-26 DIAGNOSIS — R439 Unspecified disturbances of smell and taste: Secondary | ICD-10-CM | POA: Diagnosis not present

## 2012-02-26 DIAGNOSIS — R259 Unspecified abnormal involuntary movements: Secondary | ICD-10-CM | POA: Diagnosis not present

## 2012-03-04 DIAGNOSIS — K5730 Diverticulosis of large intestine without perforation or abscess without bleeding: Secondary | ICD-10-CM | POA: Diagnosis not present

## 2012-03-04 DIAGNOSIS — Z09 Encounter for follow-up examination after completed treatment for conditions other than malignant neoplasm: Secondary | ICD-10-CM | POA: Diagnosis not present

## 2012-03-04 DIAGNOSIS — Z86010 Personal history of colonic polyps: Secondary | ICD-10-CM | POA: Diagnosis not present

## 2012-03-11 DIAGNOSIS — G250 Essential tremor: Secondary | ICD-10-CM | POA: Diagnosis not present

## 2012-03-11 DIAGNOSIS — R439 Unspecified disturbances of smell and taste: Secondary | ICD-10-CM | POA: Diagnosis not present

## 2012-03-11 DIAGNOSIS — G252 Other specified forms of tremor: Secondary | ICD-10-CM | POA: Diagnosis not present

## 2012-05-31 DIAGNOSIS — L821 Other seborrheic keratosis: Secondary | ICD-10-CM | POA: Diagnosis not present

## 2012-05-31 DIAGNOSIS — L819 Disorder of pigmentation, unspecified: Secondary | ICD-10-CM | POA: Diagnosis not present

## 2012-05-31 DIAGNOSIS — D485 Neoplasm of uncertain behavior of skin: Secondary | ICD-10-CM | POA: Diagnosis not present

## 2012-05-31 DIAGNOSIS — L570 Actinic keratosis: Secondary | ICD-10-CM | POA: Diagnosis not present

## 2012-05-31 DIAGNOSIS — Z85828 Personal history of other malignant neoplasm of skin: Secondary | ICD-10-CM | POA: Diagnosis not present

## 2012-05-31 DIAGNOSIS — D239 Other benign neoplasm of skin, unspecified: Secondary | ICD-10-CM | POA: Diagnosis not present

## 2012-06-08 DIAGNOSIS — J310 Chronic rhinitis: Secondary | ICD-10-CM | POA: Diagnosis not present

## 2012-06-08 DIAGNOSIS — R51 Headache: Secondary | ICD-10-CM | POA: Diagnosis not present

## 2012-06-08 DIAGNOSIS — G4733 Obstructive sleep apnea (adult) (pediatric): Secondary | ICD-10-CM | POA: Diagnosis not present

## 2012-06-14 DIAGNOSIS — M1710 Unilateral primary osteoarthritis, unspecified knee: Secondary | ICD-10-CM | POA: Diagnosis not present

## 2012-06-28 DIAGNOSIS — I10 Essential (primary) hypertension: Secondary | ICD-10-CM | POA: Diagnosis not present

## 2012-06-28 DIAGNOSIS — R079 Chest pain, unspecified: Secondary | ICD-10-CM | POA: Diagnosis not present

## 2012-08-16 ENCOUNTER — Other Ambulatory Visit: Payer: Self-pay | Admitting: Internal Medicine

## 2012-08-16 DIAGNOSIS — G4700 Insomnia, unspecified: Secondary | ICD-10-CM | POA: Diagnosis not present

## 2012-08-16 DIAGNOSIS — R109 Unspecified abdominal pain: Secondary | ICD-10-CM | POA: Diagnosis not present

## 2012-08-16 DIAGNOSIS — R5381 Other malaise: Secondary | ICD-10-CM | POA: Diagnosis not present

## 2012-08-17 ENCOUNTER — Ambulatory Visit
Admission: RE | Admit: 2012-08-17 | Discharge: 2012-08-17 | Disposition: A | Discharge status: Home / Self Care | Payer: Medicare Other | Source: Ambulatory Visit | Attending: Internal Medicine | Admitting: Internal Medicine

## 2012-08-17 DIAGNOSIS — K449 Diaphragmatic hernia without obstruction or gangrene: Secondary | ICD-10-CM | POA: Diagnosis not present

## 2012-08-17 DIAGNOSIS — R109 Unspecified abdominal pain: Secondary | ICD-10-CM

## 2012-08-17 DIAGNOSIS — K5730 Diverticulosis of large intestine without perforation or abscess without bleeding: Secondary | ICD-10-CM | POA: Diagnosis not present

## 2012-08-17 DIAGNOSIS — R10A0 Flank pain, unspecified side: Secondary | ICD-10-CM

## 2012-08-17 MED ORDER — IOHEXOL 300 MG/ML  SOLN
100.0000 mL | Freq: Once | INTRAMUSCULAR | Status: AC | PRN
  Administered 2012-08-17: 100 mL via INTRAVENOUS

## 2012-09-23 DIAGNOSIS — R141 Gas pain: Secondary | ICD-10-CM | POA: Diagnosis not present

## 2012-09-23 DIAGNOSIS — R142 Eructation: Secondary | ICD-10-CM | POA: Diagnosis not present

## 2012-11-01 DIAGNOSIS — R109 Unspecified abdominal pain: Secondary | ICD-10-CM | POA: Diagnosis not present

## 2012-11-01 DIAGNOSIS — R141 Gas pain: Secondary | ICD-10-CM | POA: Diagnosis not present

## 2012-11-01 DIAGNOSIS — I10 Essential (primary) hypertension: Secondary | ICD-10-CM | POA: Diagnosis not present

## 2012-11-17 DIAGNOSIS — Z23 Encounter for immunization: Secondary | ICD-10-CM | POA: Diagnosis not present

## 2012-11-29 DIAGNOSIS — Z85828 Personal history of other malignant neoplasm of skin: Secondary | ICD-10-CM | POA: Diagnosis not present

## 2012-11-29 DIAGNOSIS — L570 Actinic keratosis: Secondary | ICD-10-CM | POA: Diagnosis not present

## 2012-11-29 DIAGNOSIS — D1801 Hemangioma of skin and subcutaneous tissue: Secondary | ICD-10-CM | POA: Diagnosis not present

## 2012-11-29 DIAGNOSIS — L821 Other seborrheic keratosis: Secondary | ICD-10-CM | POA: Diagnosis not present

## 2012-11-29 DIAGNOSIS — L819 Disorder of pigmentation, unspecified: Secondary | ICD-10-CM | POA: Diagnosis not present

## 2012-12-06 DIAGNOSIS — K449 Diaphragmatic hernia without obstruction or gangrene: Secondary | ICD-10-CM | POA: Diagnosis not present

## 2012-12-06 DIAGNOSIS — R109 Unspecified abdominal pain: Secondary | ICD-10-CM | POA: Diagnosis not present

## 2012-12-06 DIAGNOSIS — R933 Abnormal findings on diagnostic imaging of other parts of digestive tract: Secondary | ICD-10-CM | POA: Diagnosis not present

## 2012-12-06 DIAGNOSIS — R141 Gas pain: Secondary | ICD-10-CM | POA: Diagnosis not present

## 2012-12-08 DIAGNOSIS — M1710 Unilateral primary osteoarthritis, unspecified knee: Secondary | ICD-10-CM | POA: Diagnosis not present

## 2013-03-02 DIAGNOSIS — J209 Acute bronchitis, unspecified: Secondary | ICD-10-CM | POA: Diagnosis not present

## 2013-03-02 DIAGNOSIS — J069 Acute upper respiratory infection, unspecified: Secondary | ICD-10-CM | POA: Diagnosis not present

## 2013-03-21 DIAGNOSIS — J019 Acute sinusitis, unspecified: Secondary | ICD-10-CM | POA: Diagnosis not present

## 2013-05-02 DIAGNOSIS — M7750 Other enthesopathy of unspecified foot: Secondary | ICD-10-CM | POA: Diagnosis not present

## 2013-05-02 DIAGNOSIS — M779 Enthesopathy, unspecified: Secondary | ICD-10-CM | POA: Diagnosis not present

## 2013-05-02 DIAGNOSIS — M216X9 Other acquired deformities of unspecified foot: Secondary | ICD-10-CM | POA: Diagnosis not present

## 2013-05-02 DIAGNOSIS — M19079 Primary osteoarthritis, unspecified ankle and foot: Secondary | ICD-10-CM | POA: Diagnosis not present

## 2013-05-02 DIAGNOSIS — G5760 Lesion of plantar nerve, unspecified lower limb: Secondary | ICD-10-CM | POA: Diagnosis not present

## 2013-05-04 DIAGNOSIS — M66879 Spontaneous rupture of other tendons, unspecified ankle and foot: Secondary | ICD-10-CM | POA: Diagnosis not present

## 2013-05-09 DIAGNOSIS — M779 Enthesopathy, unspecified: Secondary | ICD-10-CM | POA: Diagnosis not present

## 2013-05-09 DIAGNOSIS — I70209 Unspecified atherosclerosis of native arteries of extremities, unspecified extremity: Secondary | ICD-10-CM | POA: Diagnosis not present

## 2013-05-09 DIAGNOSIS — M7750 Other enthesopathy of unspecified foot: Secondary | ICD-10-CM | POA: Diagnosis not present

## 2013-05-09 DIAGNOSIS — I8310 Varicose veins of unspecified lower extremity with inflammation: Secondary | ICD-10-CM | POA: Diagnosis not present

## 2013-05-16 DIAGNOSIS — M7750 Other enthesopathy of unspecified foot: Secondary | ICD-10-CM | POA: Diagnosis not present

## 2013-05-16 DIAGNOSIS — G5760 Lesion of plantar nerve, unspecified lower limb: Secondary | ICD-10-CM | POA: Diagnosis not present

## 2013-06-06 DIAGNOSIS — L819 Disorder of pigmentation, unspecified: Secondary | ICD-10-CM | POA: Diagnosis not present

## 2013-06-06 DIAGNOSIS — L570 Actinic keratosis: Secondary | ICD-10-CM | POA: Diagnosis not present

## 2013-06-06 DIAGNOSIS — L820 Inflamed seborrheic keratosis: Secondary | ICD-10-CM | POA: Diagnosis not present

## 2013-06-06 DIAGNOSIS — L821 Other seborrheic keratosis: Secondary | ICD-10-CM | POA: Diagnosis not present

## 2013-06-06 DIAGNOSIS — D1801 Hemangioma of skin and subcutaneous tissue: Secondary | ICD-10-CM | POA: Diagnosis not present

## 2013-06-06 DIAGNOSIS — Z85828 Personal history of other malignant neoplasm of skin: Secondary | ICD-10-CM | POA: Diagnosis not present

## 2013-06-08 DIAGNOSIS — R141 Gas pain: Secondary | ICD-10-CM | POA: Diagnosis not present

## 2013-06-08 DIAGNOSIS — R142 Eructation: Secondary | ICD-10-CM | POA: Diagnosis not present

## 2013-06-09 DIAGNOSIS — R358 Other polyuria: Secondary | ICD-10-CM | POA: Diagnosis not present

## 2013-06-09 DIAGNOSIS — R3589 Other polyuria: Secondary | ICD-10-CM | POA: Diagnosis not present

## 2013-06-10 DIAGNOSIS — R358 Other polyuria: Secondary | ICD-10-CM | POA: Diagnosis not present

## 2013-06-10 DIAGNOSIS — R3589 Other polyuria: Secondary | ICD-10-CM | POA: Diagnosis not present

## 2013-06-14 DIAGNOSIS — R109 Unspecified abdominal pain: Secondary | ICD-10-CM | POA: Diagnosis not present

## 2013-06-20 DIAGNOSIS — M169 Osteoarthritis of hip, unspecified: Secondary | ICD-10-CM | POA: Diagnosis not present

## 2013-06-20 DIAGNOSIS — M1610 Unilateral primary osteoarthritis, unspecified hip: Secondary | ICD-10-CM | POA: Diagnosis not present

## 2013-06-20 DIAGNOSIS — IMO0002 Reserved for concepts with insufficient information to code with codable children: Secondary | ICD-10-CM | POA: Diagnosis not present

## 2013-06-20 DIAGNOSIS — M1710 Unilateral primary osteoarthritis, unspecified knee: Secondary | ICD-10-CM | POA: Diagnosis not present

## 2013-07-20 DIAGNOSIS — M1610 Unilateral primary osteoarthritis, unspecified hip: Secondary | ICD-10-CM | POA: Diagnosis not present

## 2013-07-20 DIAGNOSIS — M25569 Pain in unspecified knee: Secondary | ICD-10-CM | POA: Diagnosis not present

## 2013-07-20 DIAGNOSIS — M1710 Unilateral primary osteoarthritis, unspecified knee: Secondary | ICD-10-CM | POA: Diagnosis not present

## 2013-07-21 DIAGNOSIS — I10 Essential (primary) hypertension: Secondary | ICD-10-CM | POA: Diagnosis not present

## 2013-07-21 DIAGNOSIS — Z23 Encounter for immunization: Secondary | ICD-10-CM | POA: Diagnosis not present

## 2013-07-21 DIAGNOSIS — J309 Allergic rhinitis, unspecified: Secondary | ICD-10-CM | POA: Diagnosis not present

## 2013-07-21 DIAGNOSIS — G4700 Insomnia, unspecified: Secondary | ICD-10-CM | POA: Diagnosis not present

## 2013-07-21 DIAGNOSIS — K219 Gastro-esophageal reflux disease without esophagitis: Secondary | ICD-10-CM | POA: Diagnosis not present

## 2013-08-04 ENCOUNTER — Emergency Department (HOSPITAL_BASED_OUTPATIENT_CLINIC_OR_DEPARTMENT_OTHER)
Admission: EM | Admit: 2013-08-04 | Discharge: 2013-08-04 | Disposition: A | Discharge status: Home / Self Care | Payer: Medicare Other | Attending: Emergency Medicine | Admitting: Emergency Medicine

## 2013-08-04 ENCOUNTER — Encounter (HOSPITAL_BASED_OUTPATIENT_CLINIC_OR_DEPARTMENT_OTHER): Payer: Self-pay | Admitting: Emergency Medicine

## 2013-08-04 DIAGNOSIS — R112 Nausea with vomiting, unspecified: Secondary | ICD-10-CM | POA: Insufficient documentation

## 2013-08-04 DIAGNOSIS — Z79899 Other long term (current) drug therapy: Secondary | ICD-10-CM | POA: Insufficient documentation

## 2013-08-04 DIAGNOSIS — Z880 Allergy status to penicillin: Secondary | ICD-10-CM | POA: Diagnosis not present

## 2013-08-04 DIAGNOSIS — Z8673 Personal history of transient ischemic attack (TIA), and cerebral infarction without residual deficits: Secondary | ICD-10-CM | POA: Insufficient documentation

## 2013-08-04 DIAGNOSIS — Z8719 Personal history of other diseases of the digestive system: Secondary | ICD-10-CM | POA: Diagnosis not present

## 2013-08-04 DIAGNOSIS — Z87891 Personal history of nicotine dependence: Secondary | ICD-10-CM | POA: Insufficient documentation

## 2013-08-04 DIAGNOSIS — Z7902 Long term (current) use of antithrombotics/antiplatelets: Secondary | ICD-10-CM | POA: Insufficient documentation

## 2013-08-04 DIAGNOSIS — E785 Hyperlipidemia, unspecified: Secondary | ICD-10-CM | POA: Diagnosis not present

## 2013-08-04 DIAGNOSIS — R197 Diarrhea, unspecified: Secondary | ICD-10-CM | POA: Insufficient documentation

## 2013-08-04 DIAGNOSIS — E860 Dehydration: Secondary | ICD-10-CM | POA: Diagnosis not present

## 2013-08-04 DIAGNOSIS — I10 Essential (primary) hypertension: Secondary | ICD-10-CM | POA: Diagnosis not present

## 2013-08-04 DIAGNOSIS — R109 Unspecified abdominal pain: Secondary | ICD-10-CM | POA: Diagnosis not present

## 2013-08-04 HISTORY — DX: Hyperlipidemia, unspecified: E78.5

## 2013-08-04 HISTORY — DX: Gastroparesis: K31.84

## 2013-08-04 LAB — COMPREHENSIVE METABOLIC PANEL
ALBUMIN: 4.4 g/dL (ref 3.5–5.2)
ALK PHOS: 77 U/L (ref 39–117)
ALT: 16 U/L (ref 0–53)
AST: 21 U/L (ref 0–37)
BILIRUBIN TOTAL: 0.8 mg/dL (ref 0.3–1.2)
BUN: 24 mg/dL — AB (ref 6–23)
CHLORIDE: 102 meq/L (ref 96–112)
CO2: 23 mEq/L (ref 19–32)
Calcium: 9.7 mg/dL (ref 8.4–10.5)
Creatinine, Ser: 1.1 mg/dL (ref 0.50–1.35)
GFR calc Af Amer: 72 mL/min — ABNORMAL LOW (ref >90)
GFR calc non Af Amer: 62 mL/min — ABNORMAL LOW (ref >90)
Glucose, Bld: 122 mg/dL — ABNORMAL HIGH (ref 70–99)
POTASSIUM: 4.4 meq/L (ref 3.7–5.3)
Sodium: 141 mEq/L (ref 137–147)
Total Protein: 7.8 g/dL (ref 6.0–8.3)

## 2013-08-04 LAB — CBC WITH DIFFERENTIAL/PLATELET
BASOS PCT: 0 % (ref 0–1)
Basophils Absolute: 0 10*3/uL (ref 0.0–0.1)
Eosinophils Absolute: 0 10*3/uL (ref 0.0–0.7)
Eosinophils Relative: 0 % (ref 0–5)
HCT: 44.7 % (ref 39.0–52.0)
HEMOGLOBIN: 15.8 g/dL (ref 13.0–17.0)
LYMPHS ABS: 0.5 10*3/uL — AB (ref 0.7–4.0)
Lymphocytes Relative: 3 % — ABNORMAL LOW (ref 12–46)
MCH: 32.1 pg (ref 26.0–34.0)
MCHC: 35.3 g/dL (ref 30.0–36.0)
MCV: 90.9 fL (ref 78.0–100.0)
MONOS PCT: 4 % (ref 3–12)
Monocytes Absolute: 0.7 10*3/uL (ref 0.1–1.0)
NEUTROS ABS: 17.4 10*3/uL — AB (ref 1.7–7.7)
NEUTROS PCT: 94 % — AB (ref 43–77)
Platelets: 215 10*3/uL (ref 150–400)
RBC: 4.92 MIL/uL (ref 4.22–5.81)
RDW: 12.9 % (ref 11.5–15.5)
WBC: 18.6 10*3/uL — ABNORMAL HIGH (ref 4.0–10.5)

## 2013-08-04 LAB — I-STAT CG4 LACTIC ACID, ED: LACTIC ACID, VENOUS: 1.76 mmol/L (ref 0.5–2.2)

## 2013-08-04 MED ORDER — SODIUM CHLORIDE 0.9 % IV BOLUS (SEPSIS)
1000.0000 mL | Freq: Once | INTRAVENOUS | Status: AC
  Administered 2013-08-04: 1000 mL via INTRAVENOUS

## 2013-08-04 MED ORDER — ONDANSETRON HCL 4 MG/2ML IJ SOLN
4.0000 mg | Freq: Once | INTRAMUSCULAR | Status: AC
  Administered 2013-08-04: 4 mg via INTRAVENOUS
  Filled 2013-08-04: qty 2

## 2013-08-04 MED ORDER — ONDANSETRON HCL 4 MG PO TABS: 4.0000 mg | ORAL_TABLET | Freq: Four times a day (QID) | ORAL | Status: DC

## 2013-08-04 NOTE — ED Provider Notes (Addendum)
CSN: [id number]     Arrival date & time 08/04/13  1311 History   First MD Initiated Contact with Patient 08/04/13 1328     Chief Complaint  Patient presents with  . Emesis  . Diarrhea     (Consider location/radiation/quality/duration/timing/severity/associated sxs/prior Treatment) HPI Comments: No blood in stool or vomit.  No recent abx  Patient is a 80 y.o. male presenting with vomiting and diarrhea. The history is provided by the patient.  Emesis Severity:  Severe Duration:  8 hours Timing:  Constant Number of daily episodes:  Numerous Quality:  Stomach contents Progression:  Unchanged Chronicity:  New Recent urination:  Decreased Associated symptoms: abdominal pain, diarrhea and myalgias   Associated symptoms: no chills, no fever and no URI   Associated symptoms comment:  Mild abdominal soreness Risk factors: no alcohol use, no diabetes, no prior abdominal surgery, no sick contacts, no suspect food intake and no travel to endemic areas   Diarrhea Associated symptoms: abdominal pain, myalgias and vomiting   Associated symptoms: no chills and no URI     Past Medical History  Diagnosis Date  . Hypertension   . TIA (transient ischemic attack)   . Hyperlipemia   . Gastroparesis    Past Surgical History  Procedure Laterality Date  . Carotid endarterectomy    . Back surgery    . Hernia repair     History reviewed. No pertinent family history. History  Substance Use Topics  . Smoking status: Former Smoker    Quit date: 11/24/1980  . Smokeless tobacco: Not on file  . Alcohol Use: No    Review of Systems  Constitutional: Negative for chills.  Gastrointestinal: Positive for vomiting, abdominal pain and diarrhea.  Musculoskeletal: Positive for myalgias.  All other systems reviewed and are negative.     Allergies  Penicillins  Home Medications   Prior to Admission medications   Medication Sig Start Date End Date Taking? Authorizing Provider  benazepril  (LOTENSIN) 20 MG tablet Take 20 mg by mouth daily.   Yes Historical Provider, MD  cholecalciferol (VITAMIN D) 1000 UNITS tablet Take 1,000 Units by mouth daily.   Yes Historical Provider, MD  fexofenadine (ALLEGRA) 180 MG tablet Take 180 mg by mouth daily.   Yes Historical Provider, MD  Multiple Vitamin (MULTIVITAMIN WITH MINERALS) TABS Take 1 tablet by mouth daily.   Yes Historical Provider, MD  saw palmetto 500 MG capsule Take 500 mg by mouth daily.   Yes Historical Provider, MD  simvastatin (ZOCOR) 20 MG tablet Take 20 mg by mouth every evening.   Yes Historical Provider, MD  vitamin C (ASCORBIC ACID) 500 MG tablet Take 500 mg by mouth daily.   Yes Historical Provider, MD  clopidogrel (PLAVIX) 75 MG tablet Take 1 tablet (75 mg total) by mouth daily with breakfast. 12/08/11   Irven Shelling, MD  Misc Natural Products (OSTEO BI-FLEX ADV TRIPLE ST PO) Take 1 tablet by mouth daily.    Historical Provider, MD  omeprazole-sodium bicarbonate (ZEGERID) 40-1100 MG per capsule Take 1 capsule by mouth daily before breakfast.    Historical Provider, MD   BP 149/96  Pulse 100  Temp(Src) 97.8 F (36.6 C) (Oral)  Resp 18  Ht 5\' 8"  (1.727 m)  Wt 155 lb (70.308 kg)  BMI 23.57 kg/m2  SpO2 95% Physical Exam  Nursing note and vitals reviewed. Constitutional: He is oriented to person, place, and time. He appears well-developed and well-nourished. No distress.  HENT:  Head: Normocephalic and  atraumatic.  Mouth/Throat: Oropharynx is clear and moist. Mucous membranes are dry.  Eyes: Conjunctivae and EOM are normal. Pupils are equal, round, and reactive to light.  Neck: Normal range of motion. Neck supple.  Cardiovascular: Normal rate, regular rhythm and intact distal pulses.   No murmur heard. Pulmonary/Chest: Effort normal and breath sounds normal. No respiratory distress. He has no wheezes. He has no rales.  Abdominal: Soft. Bowel sounds are normal. He exhibits no distension. There is tenderness.  There is no rebound and no guarding.  Mild diffuse tenderness  Musculoskeletal: Normal range of motion. He exhibits no edema and no tenderness.  Neurological: He is alert and oriented to person, place, and time.  Skin: Skin is warm and dry. No rash noted. No erythema.  Psychiatric: He has a normal mood and affect. His behavior is normal.    ED Course  Procedures (including critical care time) Labs Review Labs Reviewed  CBC WITH DIFFERENTIAL - Abnormal; Notable for the following:    WBC 18.6 (*)    Neutrophils Relative % 94 (*)    Neutro Abs 17.4 (*)    Lymphocytes Relative 3 (*)    Lymphs Abs 0.5 (*)    All other components within normal limits  COMPREHENSIVE METABOLIC PANEL - Abnormal; Notable for the following:    Glucose, Bld 122 (*)    BUN 24 (*)    GFR calc non Af Amer 62 (*)    GFR calc Af Amer 72 (*)    All other components within normal limits  I-STAT CG4 LACTIC ACID, ED    Imaging Review No results found.   EKG Interpretation None      MDM   Final diagnoses:  Nausea vomiting and diarrhea    Pt with symptoms most consistent with a viral process with fever/vomitting/diarrhea.  Denies bad food exposure and recent travel out of the country.  No recent abx.  No hx concerning for GU pathology or kidney stones.  Pt is awake and alert on exam without peritoneal signs.  No sx concerning for obstruction.  Pt does have hx of gastroparesis but states this is nothing like his gastroparesis.  3:13 PM Labs normal except for leukocytosis.  Pt feeling better after fluids.  Will po challenge and d/c home if tolerating po's.    Blanchie Dessert, MD 08/04/13 [place], MD 08/04/13 1517

## 2013-08-04 NOTE — ED Provider Notes (Signed)
Filed Vitals:   08/04/13 1319  BP: 149/96  Pulse: 100  Temp: 97.8 F (36.6 C)  Resp: 18   Patient improved and wishes to go home.   Shaune Pollack, MD 08/04/13 [phone]

## 2013-08-04 NOTE — ED Notes (Signed)
N/v/d since 530am. Vomiting 6X and diarrhea 12X. Chills and aching

## 2013-08-16 DIAGNOSIS — M25559 Pain in unspecified hip: Secondary | ICD-10-CM | POA: Diagnosis not present

## 2013-08-22 DIAGNOSIS — J45901 Unspecified asthma with (acute) exacerbation: Secondary | ICD-10-CM | POA: Diagnosis not present

## 2013-08-24 DIAGNOSIS — M1710 Unilateral primary osteoarthritis, unspecified knee: Secondary | ICD-10-CM | POA: Diagnosis not present

## 2013-08-24 DIAGNOSIS — M1610 Unilateral primary osteoarthritis, unspecified hip: Secondary | ICD-10-CM | POA: Diagnosis not present

## 2013-09-14 DIAGNOSIS — K6389 Other specified diseases of intestine: Secondary | ICD-10-CM | POA: Diagnosis not present

## 2013-10-20 ENCOUNTER — Other Ambulatory Visit: Payer: Self-pay | Admitting: Gastroenterology

## 2013-10-20 DIAGNOSIS — R079 Chest pain, unspecified: Secondary | ICD-10-CM

## 2013-10-20 DIAGNOSIS — R142 Eructation: Secondary | ICD-10-CM | POA: Diagnosis not present

## 2013-10-20 DIAGNOSIS — K219 Gastro-esophageal reflux disease without esophagitis: Secondary | ICD-10-CM | POA: Diagnosis not present

## 2013-10-20 DIAGNOSIS — R5383 Other fatigue: Secondary | ICD-10-CM | POA: Diagnosis not present

## 2013-10-20 DIAGNOSIS — R141 Gas pain: Secondary | ICD-10-CM | POA: Diagnosis not present

## 2013-10-20 DIAGNOSIS — IMO0001 Reserved for inherently not codable concepts without codable children: Secondary | ICD-10-CM

## 2013-10-20 DIAGNOSIS — K449 Diaphragmatic hernia without obstruction or gangrene: Secondary | ICD-10-CM | POA: Diagnosis not present

## 2013-10-20 DIAGNOSIS — R5381 Other malaise: Secondary | ICD-10-CM | POA: Diagnosis not present

## 2013-10-24 ENCOUNTER — Ambulatory Visit
Admission: RE | Admit: 2013-10-24 | Discharge: 2013-10-24 | Disposition: A | Discharge status: Home / Self Care | Payer: Medicare Other | Source: Ambulatory Visit | Attending: Gastroenterology | Admitting: Gastroenterology

## 2013-10-24 DIAGNOSIS — K449 Diaphragmatic hernia without obstruction or gangrene: Secondary | ICD-10-CM | POA: Diagnosis not present

## 2013-10-24 DIAGNOSIS — K219 Gastro-esophageal reflux disease without esophagitis: Principal | ICD-10-CM

## 2013-10-24 DIAGNOSIS — R079 Chest pain, unspecified: Secondary | ICD-10-CM

## 2013-10-24 DIAGNOSIS — IMO0001 Reserved for inherently not codable concepts without codable children: Secondary | ICD-10-CM

## 2013-11-03 DIAGNOSIS — K449 Diaphragmatic hernia without obstruction or gangrene: Secondary | ICD-10-CM | POA: Diagnosis not present

## 2013-11-08 DIAGNOSIS — M1710 Unilateral primary osteoarthritis, unspecified knee: Secondary | ICD-10-CM | POA: Diagnosis not present

## 2013-11-10 ENCOUNTER — Other Ambulatory Visit (INDEPENDENT_AMBULATORY_CARE_PROVIDER_SITE_OTHER): Payer: Self-pay | Admitting: Surgery

## 2013-11-10 DIAGNOSIS — K440 Diaphragmatic hernia with obstruction, without gangrene: Secondary | ICD-10-CM | POA: Insufficient documentation

## 2013-11-10 NOTE — H&P (Signed)
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 male with a symptomatic giant hiatus hernia.  He is ready to undergo surgical repair. I have reviewed his UGI.  Procedure explained to him in some detail.    Past Medical History  Diagnosis Date  . Hypertension   . TIA (transient ischemic attack)   . Hyperlipemia   . Gastroparesis     Past Surgical History  Procedure Laterality Date  . Carotid endarterectomy    . Back surgery    . Hernia repair      Current Outpatient Prescriptions  Medication Sig Dispense Refill  . benazepril (LOTENSIN) 20 MG tablet Take 20 mg by mouth daily.      . cholecalciferol (VITAMIN D) 1000 UNITS tablet Take 1,000 Units by mouth daily.      . clopidogrel (PLAVIX) 75 MG tablet Take 1 tablet (75 mg total) by mouth daily with breakfast.  30 tablet  11  . fexofenadine (ALLEGRA) 180 MG tablet Take 180 mg by mouth daily.      . Misc Natural Products (OSTEO BI-FLEX ADV TRIPLE ST PO) Take 1 tablet by mouth daily.      . Multiple Vitamin (MULTIVITAMIN WITH MINERALS) TABS Take 1 tablet by mouth daily.      Marland Kitchen omeprazole-sodium bicarbonate (ZEGERID) 40-1100 MG per capsule Take 1 capsule by mouth daily before breakfast.      . ondansetron (ZOFRAN) 4 MG tablet Take 1 tablet (4 mg total) by mouth every 6 (six) hours.  12 tablet  0  . saw palmetto 500 MG capsule Take 500 mg by mouth daily.      . simvastatin (ZOCOR) 20 MG tablet Take 20 mg by mouth every evening.      . vitamin C (ASCORBIC ACID) 500 MG tablet Take 500 mg by mouth daily.       No current facility-administered medications for this visit.   Penicillins No family history on file.---father of Alex Krakow CRNA at [employer] History:   reports that he quit smoking about 32 years ago. He does not have any smokeless tobacco history on file. He reports that he does not drink alcohol or use illicit drugs.   REVIEW OF  SYSTEMS : Negative except for symptoms related to HPI  Physical Exam:   There were no vitals taken for this visit. There is no weight on file to calculate BMI.  Gen:  WDWN Wmale NAD  Neurological: Alert and oriented to person, place, and time. Motor and sensory function is grossly intact  Head: Normocephalic and atraumatic.  Eyes: Conjunctivae are normal. Pupils are equal, round, and reactive to light. No scleral icterus.  Neck: Normal range of motion. Neck supple. No tracheal deviation or thyromegaly present.  Cardiovascular:  SR without murmurs or gallops.  No carotid bruits Breast:  Normal for man Respiratory: Effort normal.  No respiratory distress. No chest wall tenderness. Breath sounds normal.  No wheezes, rales or rhonchi.  Abdomen:  Flat and nontender GU:  Not examined Musculoskeletal: Normal range of motion. Extremities are nontender. No cyanosis, edema or clubbing noted Lymphadenopathy: No cervical, preauricular, postauricular or axillary adenopathy is present Skin: Skin is warm and dry. No rash noted. No diaphoresis. No erythema. No pallor. Pscyh: Normal mood and affect. Behavior is normal. Judgment and thought content normal.   LABORATORY RESULTS: No results found for this or any previous visit (from the  past 48 hour(s)).   RADIOLOGY RESULTS: No results found.  Problem List: Patient Active Problem List   Diagnosis Date Noted  . 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 TIA (transient ischemic attack) 12/06/2011  . Transient global amnesia 12/06/2011  . Bacterial overgrowth syndrome 12/06/2011  . Hypertension 12/06/2011  . H/O carotid artery stenosis 12/06/2011  . CAD (coronary artery disease) 12/06/2011    Assessment & Plan: Very fit 80 year old man with a very symptomatic hiatus hernia    Matt B. Hassell Done, MD, [facility] [phone] beeper [phone]  11/10/2013 1:55 PM

## 2013-11-17 ENCOUNTER — Encounter (HOSPITAL_COMMUNITY): Payer: Self-pay | Admitting: Pharmacy Technician

## 2013-11-18 ENCOUNTER — Ambulatory Visit (HOSPITAL_COMMUNITY)
Admission: RE | Admit: 2013-11-18 | Discharge: 2013-11-18 | Disposition: A | Discharge status: Home / Self Care | Payer: Medicare Other | Source: Ambulatory Visit | Attending: Anesthesiology | Admitting: Anesthesiology

## 2013-11-18 ENCOUNTER — Encounter (HOSPITAL_COMMUNITY)
Admission: RE | Admit: 2013-11-18 | Discharge: 2013-11-18 | Disposition: A | Discharge status: Home / Self Care | Payer: Medicare Other | Source: Ambulatory Visit | Attending: Surgery | Admitting: Surgery

## 2013-11-18 ENCOUNTER — Encounter (HOSPITAL_COMMUNITY): Payer: Self-pay

## 2013-11-18 DIAGNOSIS — I10 Essential (primary) hypertension: Secondary | ICD-10-CM | POA: Insufficient documentation

## 2013-11-18 DIAGNOSIS — K449 Diaphragmatic hernia without obstruction or gangrene: Secondary | ICD-10-CM | POA: Diagnosis not present

## 2013-11-18 DIAGNOSIS — M1710 Unilateral primary osteoarthritis, unspecified knee: Secondary | ICD-10-CM | POA: Diagnosis not present

## 2013-11-18 HISTORY — DX: Sleep apnea, unspecified: G47.30

## 2013-11-18 HISTORY — DX: Headache: R51

## 2013-11-18 HISTORY — DX: Unspecified disorder of synovium and tendon, unspecified shoulder: M67.919

## 2013-11-18 LAB — BASIC METABOLIC PANEL
Anion gap: 12 (ref 5–15)
BUN: 17 mg/dL (ref 6–23)
CO2: 25 mEq/L (ref 19–32)
Calcium: 9.1 mg/dL (ref 8.4–10.5)
Chloride: 103 mEq/L (ref 96–112)
Creatinine, Ser: 1.01 mg/dL (ref 0.50–1.35)
GFR calc non Af Amer: 69 mL/min — ABNORMAL LOW (ref >90)
GFR, EST AFRICAN AMERICAN: 79 mL/min — AB (ref >90)
Glucose, Bld: 114 mg/dL — ABNORMAL HIGH (ref 70–99)
POTASSIUM: 4.1 meq/L (ref 3.7–5.3)
Sodium: 140 mEq/L (ref 137–147)

## 2013-11-18 LAB — CBC
HCT: 39.7 % (ref 39.0–52.0)
Hemoglobin: 13.9 g/dL (ref 13.0–17.0)
MCH: 31.4 pg (ref 26.0–34.0)
MCHC: 35 g/dL (ref 30.0–36.0)
MCV: 89.6 fL (ref 78.0–100.0)
Platelets: 210 10*3/uL (ref 150–400)
RBC: 4.43 MIL/uL (ref 4.22–5.81)
RDW: 12.8 % (ref 11.5–15.5)
WBC: 7.4 10*3/uL (ref 4.0–10.5)

## 2013-11-18 NOTE — Progress Notes (Signed)
Your patient has screened at an elevated risk for Obstructive Sleep Apnea using the Stop-Bang Tool during a pre-surgical vist. A score of 4 or greater is an elevated risk. Score of 5.  

## 2013-11-18 NOTE — Patient Instructions (Signed)
22 Alex Oconnor  11/18/2013   Your procedure is scheduled on:    11/22/2013  Report to [facility] Main Entrance and follow signs to  [place] arrive at 12:00 PM.   Call this number if you have problems the morning of surgery [phone] or Presurgical Testing [phone].   Remember:  Do not eat food after midnight;may take clear liquids till 0800 am then nothing by mouth.  For Living Will and/or Health Care Power Attorney Forms: please provide copy for your medical record, may bring AM of surgery (forms should be already notarized-we do not provide this service).  Remember: follow any bowel prep instructions per MD office;fleet enema night before surgery.     Take these medicines the morning of surgery with A SIP OF WATER: Pulmicort (bring day of surgery);Allegra;Flonase;Zegerid                               You may not have any metal on your body including hair pins and piercings  Do not wear jewelry, powders, or deodorant.  Men may shave face and neck.               Do not bring valuables to the hospital. [name].  Contacts, dentures or bridgework may not be worn into surgery.  Leave suitcase in the car. After surgery it may be brought to your room.  For patients admitted to the hospital, checkout time is 11:00 AM the day of discharge.   ________________________________________________________________________  [facility] - Preparing for Surgery Before surgery, you can play an important role.  Because skin is not sterile, your skin needs to be as free of germs as possible.  You can reduce the number of germs on your skin by washing with CHG (chlorahexidine gluconate) soap before surgery.  CHG is an antiseptic cleaner which kills germs and bonds with the skin to continue killing germs even after washing. Please DO NOT use if you have an allergy to CHG or antibacterial soaps.  If your skin becomes reddened/irritated stop using  the CHG and inform your nurse when you arrive at Short Stay. Do not shave (including legs and underarms) for at least 48 hours prior to the first CHG shower.  You may shave your face/neck. Please follow these instructions carefully:  1.  Shower with CHG Soap the night before surgery and the  morning of Surgery.  2.  If you choose to wash your hair, wash your hair first as usual with your  normal  shampoo.  3.  After you shampoo, rinse your hair and body thoroughly to remove the  shampoo.                           4.  Use CHG as you would any other liquid soap.  You can apply chg directly  to the skin and wash                       Gently with a scrungie or clean washcloth.  5.  Apply the CHG Soap to your body ONLY FROM THE NECK DOWN.   Do not use on face/ open                           Wound or open sores. Avoid contact  with eyes, ears mouth and genitals (private parts).                       Wash face,  Genitals (private parts) with your normal soap.             6.  Wash thoroughly, paying special attention to the area where your surgery  will be performed.  7.  Thoroughly rinse your body with warm water from the neck down.  8.  DO NOT shower/wash with your normal soap after using and rinsing off  the CHG Soap.                9.  Pat yourself dry with a clean towel.            10.  Wear clean pajamas.            11.  Place clean sheets on your bed the night of your first shower and do not  sleep with pets. Day of Surgery : Do not apply any lotions/deodorants the morning of surgery.  Please wear clean clothes to the hospital/surgery center.  FAILURE TO FOLLOW THESE INSTRUCTIONS MAY RESULT IN THE CANCELLATION OF YOUR SURGERY PATIENT SIGNATURE_________________________________  NURSE SIGNATURE__________________________________  ________________________________________________________________________    CLEAR LIQUID DIET   Foods Allowed                                                                      Foods Excluded  Coffee and tea, regular and decaf                             liquids that you cannot  Plain Jell-O in any flavor                                             see through such as: Fruit ices (not with fruit pulp)                                     milk, soups, orange juice  Iced Popsicles                                    All solid food Carbonated beverages, regular and diet                                    Cranberry, grape and apple juices Sports drinks like Gatorade Lightly seasoned clear broth or consume(fat free) Sugar, honey syrup  Sample Menu Breakfast                                Lunch  Supper Cranberry juice                    Beef broth                            Chicken broth Jell-O                                     Grape juice                           Apple juice Coffee or tea                        Jell-O                                      Popsicle                                                Coffee or tea                        Coffee or tea  _____________________________________________________________________

## 2013-11-18 NOTE — Progress Notes (Signed)
Pt states weaned self off CPAP several years ago  Hx of TIA 2 years ago  Noted pre op meds drop BP

## 2013-11-22 ENCOUNTER — Inpatient Hospital Stay (HOSPITAL_COMMUNITY): Admission: RE | Admit: 2013-11-22 | Payer: Medicare Other | Source: Ambulatory Visit | Admitting: Surgery

## 2013-11-22 ENCOUNTER — Encounter (HOSPITAL_COMMUNITY): Admission: RE | Payer: Self-pay | Source: Ambulatory Visit

## 2013-11-22 DIAGNOSIS — J069 Acute upper respiratory infection, unspecified: Secondary | ICD-10-CM | POA: Diagnosis not present

## 2013-11-22 SURGERY — REPAIR, HERNIA, HIATAL, LAPAROSCOPIC
Anesthesia: General

## 2013-11-28 DIAGNOSIS — M1612 Unilateral primary osteoarthritis, left hip: Secondary | ICD-10-CM | POA: Diagnosis not present

## 2013-11-28 DIAGNOSIS — M1711 Unilateral primary osteoarthritis, right knee: Secondary | ICD-10-CM | POA: Diagnosis not present

## 2013-11-28 DIAGNOSIS — M1712 Unilateral primary osteoarthritis, left knee: Secondary | ICD-10-CM | POA: Diagnosis not present

## 2013-12-02 ENCOUNTER — Encounter (HOSPITAL_COMMUNITY): Payer: Self-pay | Admitting: *Deleted

## 2013-12-02 ENCOUNTER — Encounter: Payer: Self-pay | Admitting: *Deleted

## 2013-12-02 NOTE — Progress Notes (Signed)
Please put orders in surgery 12-08-13 for Same day surgery Thank you

## 2013-12-06 NOTE — Anesthesia Preprocedure Evaluation (Addendum)
Anesthesia Evaluation  Patient identified by MRN, date of birth, ID band Patient awake    Reviewed: Allergy & Precautions, H&P , NPO status , Patient's Chart, lab work & pertinent test results  Airway Mallampati: II TM Distance: >3 FB Neck ROM: Full    Dental no notable dental hx.    Pulmonary asthma , sleep apnea , former smoker,  breath sounds clear to auscultation  Pulmonary exam normal       Cardiovascular Exercise Tolerance: Good hypertension, Pt. on medications + CAD Rhythm:Regular Rate:Normal  ECG: Normal.  CXR: No acute disease.   Neuro/Psych  Headaches, TIA : October, 2013. Symptom was word finding difficulty.  ECHO 2013: EF normal. No embolic source.  Carotids: 40-59% stenosis bilaterally. TIA Neuromuscular disease negative psych ROS   GI/Hepatic Neg liver ROS, hiatal hernia, GERD-  Medicated,  Endo/Other  negative endocrine ROS  Renal/GU negative Renal ROS  negative genitourinary   Musculoskeletal negative musculoskeletal ROS (+)   Abdominal   Peds negative pediatric ROS (+)  Hematology negative hematology ROS (+)   Anesthesia Other Findings   Reproductive/Obstetrics negative OB ROS                       Anesthesia Physical Anesthesia Plan  ASA: III  Anesthesia Plan: General   Post-op Pain Management:    Induction: Intravenous  Airway Management Planned: Oral ETT  Additional Equipment:   Intra-op Plan:   Post-operative Plan: Extubation in OR  Informed Consent: I have reviewed the patients History and Physical, chart, labs and discussed the procedure including the risks, benefits and alternatives for the proposed anesthesia with the patient or authorized representative who has indicated his/her understanding and acceptance.   Dental advisory given  Plan Discussed with: CRNA  Anesthesia Plan Comments:         Anesthesia Quick Evaluation

## 2013-12-07 ENCOUNTER — Encounter (INDEPENDENT_AMBULATORY_CARE_PROVIDER_SITE_OTHER): Payer: Self-pay | Admitting: Surgery

## 2013-12-07 ENCOUNTER — Ambulatory Visit (INDEPENDENT_AMBULATORY_CARE_PROVIDER_SITE_OTHER): Payer: Self-pay | Admitting: Surgery

## 2013-12-07 NOTE — Progress Notes (Signed)
Called Dr. Melvenia Needles office had to leave a message for [facility] scheduler for Dr. Hassell Done  asking for orders to be placed in Epic for this new surgery date 12-07-13

## 2013-12-07 NOTE — H&P (Signed)
Chief Complaint:  Chest pressure and dysphagia  History of Present Illness:  Alex Oconnor is an 80 y.o. male seen by me on 11/10/13 for the above complaints after workup and referral by Dr. Cristina Gong.  He had GERD for several years and as his hiatal hernia has grown worse, the GERD has been replaced with more pressure sensations in his chest.  He was seen and informed consent was obtained regarding laparoscopic and open hiatal hernia repair with Nissen fundoplication.    Past Medical History  Diagnosis Date  . Hypertension   . TIA (transient ischemic attack)     11/2011  . Hyperlipemia   . Gastroparesis   . Headache(784.0)     saw in ED 08/2006  . Sleep apnea     10/2001  . Rotator cuff disorder   . Asthma   . Coronary artery disease   . GERD (gastroesophageal reflux disease)   . Irritable bowel syndrome     Past Surgical History  Procedure Laterality Date  . Carotid endarterectomy      right side 1997  . Cardiac catheterization      May 1999  . Cardiovascular stress test      10/1999;09/2002  . Hernia repair      2001  . Knee arthroscopy      right knee 06/2002;12/2005-left knee  . Abd ultrasound       03/2007-xray of abd;01/2008-ultrasound of abd;01/2009-ultrasound of abd  . Colonscopy      02/2007  . Back surgery      MRI lower back 09/2010;back injected 10/2010;11/2010 spinal lumbar fusion L4-5  . Injection of knees      06/2012 both knees injected;10/2013 both knees injected   . Right hip injected      08/2013  . Eyelid surgery      removal of ingrown eyelashes  . Colonoscopy      Current Outpatient Prescriptions  Medication Sig Dispense Refill  . albuterol (PROVENTIL HFA;VENTOLIN HFA) 108 (90 BASE) MCG/ACT inhaler Inhale 1 puff into the lungs every 4 (four) hours as needed for wheezing or shortness of breath.      Marland Kitchen aspirin 81 MG tablet Take 81 mg by mouth daily. Pt was taking daily stopped taking two days ago (11/16/2013)      . benazepril (LOTENSIN) 20 MG tablet Take  20 mg by mouth every morning.       . benazepril-hydrochlorthiazide (LOTENSIN HCT) 20-25 MG per tablet Take 20 tablets by mouth once.      . Budesonide (PULMICORT IN) Inhale 1 puff into the lungs once as needed (allergies).      . cholecalciferol (VITAMIN D) 1000 UNITS tablet Take 1,000 Units by mouth daily.      . fexofenadine (ALLEGRA) 180 MG tablet Take 180 mg by mouth daily as needed for allergies.       . fluticasone (FLONASE) 50 MCG/ACT nasal spray Place 1 spray into both nostrils daily as needed for allergies or rhinitis.      . Lactobacillus Rhamnosus, GG, (CULTURELLE FOR KIDS) 10 B CELL CAPS Take by mouth once.      Marland Kitchen LORazepam (ATIVAN) 1 MG tablet Take 0.5 mg by mouth at bedtime.      . Misc Natural Products (OSTEO BI-FLEX ADV TRIPLE ST PO) Take 1 tablet by mouth daily.      . Multiple Vitamins-Minerals (CENTRUM SILVER) tablet Take by mouth.      Marland Kitchen omeprazole-sodium bicarbonate (ZEGERID) 40-1100 MG per capsule Take 1  capsule by mouth daily before breakfast.      . Probiotic Product (PROBIOTIC DAILY PO) Take by mouth. Takes cutrelle 1 daily orally      . saw palmetto 500 MG capsule Take 500 mg by mouth daily.      . simvastatin (ZOCOR) 20 MG tablet Take 20 mg by mouth every morning.       . vitamin C (ASCORBIC ACID) 500 MG tablet Take 500 mg by mouth.       No current facility-administered medications for this visit.   Penicillins and Plavix History reviewed. No pertinent family history. Social History:   reports that he quit smoking about 33 years ago. He has never used smokeless tobacco. He reports that he drinks alcohol. He reports that he does not use illicit drugs.   REVIEW OF SYSTEMS : Negative except for pertinent positives in HPI  Physical Exam:   There were no vitals taken for this visit. There is no weight on file to calculate BMI.  Gen:  WDWN WM NAD  Neurological: Alert and oriented to person, place, and time. Motor and sensory function is grossly intact  Head:  Normocephalic and atraumatic.  Eyes: Conjunctivae are normal. Pupils are equal, round, and reactive to light. No scleral icterus.  Neck: Normal range of motion. Neck supple. No tracheal deviation or thyromegaly present.  Cardiovascular:  SR without murmurs or gallops.  No carotid bruits Breast:  No masses Respiratory: Effort normal.  No respiratory distress. No chest wall tenderness. Breath sounds normal.  No wheezes, rales or rhonchi.  Abdomen:  Flat and nontender GU:  Did not examine Musculoskeletal: Normal range of motion. Extremities are nontender. No cyanosis, edema or clubbing noted Lymphadenopathy: No cervical, preauricular, postauricular or axillary adenopathy is present Skin: Skin is warm and dry. No rash noted. No diaphoresis. No erythema. No pallor. Pscyh: Normal mood and affect. Behavior is normal. Judgment and thought content normal.   LABORATORY RESULTS: No results found for this or any previous visit (from the past 48 hour(s)).   RADIOLOGY RESULTS: No results found.  Problem List: Patient Active Problem List   Diagnosis Date Noted  . 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 TIA (transient ischemic attack) 12/06/2011  . Transient global amnesia 12/06/2011  . Bacterial overgrowth syndrome 12/06/2011  . Hypertension 12/06/2011  . H/O carotid artery stenosis 12/06/2011  . CAD (coronary artery disease) 12/06/2011    Assessment & Plan: Large type III hiatal hernia.  Plan lap repair.      Matt B. Hassell Done, MD, [facility] [phone] beeper [phone]  12/07/2013 6:55 PM

## 2013-12-07 NOTE — Progress Notes (Signed)
Called triage nurse to ask for orders to go int Epic for surgery tomorrow 12-08-13, will send message to Dr. Hassell Done Thanks

## 2013-12-08 ENCOUNTER — Encounter (HOSPITAL_COMMUNITY): Payer: Medicare Other | Admitting: Anesthesiology

## 2013-12-08 ENCOUNTER — Encounter (HOSPITAL_COMMUNITY): Payer: Self-pay | Admitting: *Deleted

## 2013-12-08 ENCOUNTER — Encounter (HOSPITAL_COMMUNITY)
Admission: RE | Disposition: A | Discharge status: Home / Self Care | Payer: Self-pay | Source: Ambulatory Visit | Attending: Surgery

## 2013-12-08 ENCOUNTER — Inpatient Hospital Stay (HOSPITAL_COMMUNITY): Payer: Medicare Other | Admitting: Anesthesiology

## 2013-12-08 ENCOUNTER — Inpatient Hospital Stay (HOSPITAL_COMMUNITY)
Admission: RE | Admit: 2013-12-08 | Discharge: 2013-12-11 | DRG: 327 | Disposition: A | Discharge status: Home / Self Care | Payer: Medicare Other | Source: Ambulatory Visit | Attending: Surgery | Admitting: Surgery

## 2013-12-08 DIAGNOSIS — K567 Ileus, unspecified: Secondary | ICD-10-CM | POA: Diagnosis not present

## 2013-12-08 DIAGNOSIS — K219 Gastro-esophageal reflux disease without esophagitis: Secondary | ICD-10-CM | POA: Diagnosis present

## 2013-12-08 DIAGNOSIS — Z87891 Personal history of nicotine dependence: Secondary | ICD-10-CM | POA: Diagnosis not present

## 2013-12-08 DIAGNOSIS — R1310 Dysphagia, unspecified: Secondary | ICD-10-CM | POA: Diagnosis not present

## 2013-12-08 DIAGNOSIS — Z8673 Personal history of transient ischemic attack (TIA), and cerebral infarction without residual deficits: Secondary | ICD-10-CM

## 2013-12-08 DIAGNOSIS — R339 Retention of urine, unspecified: Secondary | ICD-10-CM | POA: Diagnosis not present

## 2013-12-08 DIAGNOSIS — G4730 Sleep apnea, unspecified: Secondary | ICD-10-CM | POA: Diagnosis present

## 2013-12-08 DIAGNOSIS — J45909 Unspecified asthma, uncomplicated: Secondary | ICD-10-CM | POA: Diagnosis present

## 2013-12-08 DIAGNOSIS — E785 Hyperlipidemia, unspecified: Secondary | ICD-10-CM | POA: Diagnosis present

## 2013-12-08 DIAGNOSIS — I10 Essential (primary) hypertension: Secondary | ICD-10-CM | POA: Diagnosis present

## 2013-12-08 DIAGNOSIS — K449 Diaphragmatic hernia without obstruction or gangrene: Secondary | ICD-10-CM

## 2013-12-08 DIAGNOSIS — I2510 Atherosclerotic heart disease of native coronary artery without angina pectoris: Secondary | ICD-10-CM | POA: Diagnosis present

## 2013-12-08 DIAGNOSIS — Z9889 Other specified postprocedural states: Secondary | ICD-10-CM

## 2013-12-08 HISTORY — PX: INSERTION OF MESH: SHX5868

## 2013-12-08 HISTORY — PX: HIATAL HERNIA REPAIR: SHX195

## 2013-12-08 LAB — CBC
HCT: 39 % (ref 39.0–52.0)
Hemoglobin: 13.8 g/dL (ref 13.0–17.0)
MCH: 31.2 pg (ref 26.0–34.0)
MCHC: 35.4 g/dL (ref 30.0–36.0)
MCV: 88.2 fL (ref 78.0–100.0)
PLATELETS: 200 10*3/uL (ref 150–400)
RBC: 4.42 MIL/uL (ref 4.22–5.81)
RDW: 12.5 % (ref 11.5–15.5)
WBC: 17 10*3/uL — ABNORMAL HIGH (ref 4.0–10.5)

## 2013-12-08 LAB — CREATININE, SERUM
CREATININE: 1.07 mg/dL (ref 0.50–1.35)
GFR calc Af Amer: 74 mL/min — ABNORMAL LOW (ref >90)
GFR, EST NON AFRICAN AMERICAN: 64 mL/min — AB (ref >90)

## 2013-12-08 SURGERY — REPAIR, HERNIA, HIATAL, LAPAROSCOPIC
Anesthesia: General | Site: Abdomen

## 2013-12-08 MED ORDER — ONDANSETRON HCL 4 MG/2ML IJ SOLN
4.0000 mg | Freq: Four times a day (QID) | INTRAMUSCULAR | Status: DC | PRN
  Administered 2013-12-08 – 2013-12-10 (×2): 4 mg via INTRAVENOUS
  Filled 2013-12-08 (×3): qty 2

## 2013-12-08 MED ORDER — HEPARIN SODIUM (PORCINE) 5000 UNIT/ML IJ SOLN
5000.0000 [IU] | Freq: Once | INTRAMUSCULAR | Status: AC
  Administered 2013-12-08: 5000 [IU] via SUBCUTANEOUS
  Filled 2013-12-08: qty 1

## 2013-12-08 MED ORDER — DEXAMETHASONE SODIUM PHOSPHATE 10 MG/ML IJ SOLN
INTRAMUSCULAR | Status: DC | PRN
  Administered 2013-12-08: 10 mg via INTRAVENOUS

## 2013-12-08 MED ORDER — PHENYLEPHRINE HCL 10 MG/ML IJ SOLN
INTRAMUSCULAR | Status: DC | PRN
  Administered 2013-12-08 (×2): 40 ug via INTRAVENOUS

## 2013-12-08 MED ORDER — FENTANYL CITRATE 0.05 MG/ML IJ SOLN
INTRAMUSCULAR | Status: DC | PRN
  Administered 2013-12-08 (×4): 50 ug via INTRAVENOUS

## 2013-12-08 MED ORDER — ROCURONIUM BROMIDE 100 MG/10ML IV SOLN
INTRAVENOUS | Status: DC | PRN
  Administered 2013-12-08: 40 mg via INTRAVENOUS
  Administered 2013-12-08 (×2): 10 mg via INTRAVENOUS

## 2013-12-08 MED ORDER — CHLORHEXIDINE GLUCONATE 4 % EX LIQD: 1.0000 "application " | Freq: Once | CUTANEOUS | Status: DC

## 2013-12-08 MED ORDER — NEOSTIGMINE METHYLSULFATE 10 MG/10ML IV SOLN
INTRAVENOUS | Status: DC | PRN
  Administered 2013-12-08: 3.5 mg via INTRAVENOUS

## 2013-12-08 MED ORDER — 0.9 % SODIUM CHLORIDE (POUR BTL) OPTIME
TOPICAL | Status: DC | PRN
  Administered 2013-12-08: 1000 mL

## 2013-12-08 MED ORDER — FENTANYL CITRATE 0.05 MG/ML IJ SOLN
INTRAMUSCULAR | Status: AC
  Filled 2013-12-08: qty 5

## 2013-12-08 MED ORDER — ONDANSETRON HCL 4 MG PO TABS: 4.0000 mg | ORAL_TABLET | Freq: Four times a day (QID) | ORAL | Status: DC | PRN

## 2013-12-08 MED ORDER — SODIUM CHLORIDE 0.9 % IJ SOLN
INTRAMUSCULAR | Status: AC
  Filled 2013-12-08: qty 10

## 2013-12-08 MED ORDER — KCL IN DEXTROSE-NACL 20-5-0.45 MEQ/L-%-% IV SOLN
INTRAVENOUS | Status: DC
  Administered 2013-12-08 – 2013-12-10 (×4): via INTRAVENOUS
  Filled 2013-12-08 (×8): qty 1000

## 2013-12-08 MED ORDER — ONDANSETRON HCL 4 MG/2ML IJ SOLN
INTRAMUSCULAR | Status: DC | PRN
  Administered 2013-12-08: 4 mg via INTRAVENOUS

## 2013-12-08 MED ORDER — METOPROLOL TARTRATE 1 MG/ML IV SOLN
5.0000 mg | Freq: Four times a day (QID) | INTRAVENOUS | Status: DC | PRN
Start: 2013-12-08 — End: 2013-12-11
  Administered 2013-12-08 – 2013-12-09 (×3): 5 mg via INTRAVENOUS
  Filled 2013-12-08 (×2): qty 5

## 2013-12-08 MED ORDER — DEXAMETHASONE SODIUM PHOSPHATE 10 MG/ML IJ SOLN
INTRAMUSCULAR | Status: AC
  Filled 2013-12-08: qty 1

## 2013-12-08 MED ORDER — LACTATED RINGERS IV SOLN
INTRAVENOUS | Status: DC | PRN
  Administered 2013-12-08: 07:00:00 via INTRAVENOUS

## 2013-12-08 MED ORDER — LEVOFLOXACIN IN D5W 500 MG/100ML IV SOLN
500.0000 mg | INTRAVENOUS | Status: DC
  Administered 2013-12-08: 500 mg via INTRAVENOUS
  Filled 2013-12-08 (×2): qty 100

## 2013-12-08 MED ORDER — DEXTROSE 5 % IV SOLN: 2.0000 g | INTRAVENOUS | Status: DC

## 2013-12-08 MED ORDER — HYDROCODONE-ACETAMINOPHEN 7.5-325 MG/15ML PO SOLN: 10.0000 mL | ORAL | Status: DC | PRN

## 2013-12-08 MED ORDER — PROPOFOL 10 MG/ML IV BOLUS
INTRAVENOUS | Status: AC
Start: 2013-12-08 — End: 2013-12-08
  Filled 2013-12-08: qty 20

## 2013-12-08 MED ORDER — LACTATED RINGERS IV SOLN
INTRAVENOUS | Status: DC | PRN
  Administered 2013-12-08: 3000 mL

## 2013-12-08 MED ORDER — MORPHINE SULFATE 2 MG/ML IJ SOLN
1.0000 mg | INTRAMUSCULAR | Status: DC | PRN
  Administered 2013-12-08 – 2013-12-09 (×4): 1 mg via INTRAVENOUS
  Filled 2013-12-08 (×4): qty 1

## 2013-12-08 MED ORDER — GLYCOPYRROLATE 0.2 MG/ML IJ SOLN
INTRAMUSCULAR | Status: AC
  Filled 2013-12-08: qty 3

## 2013-12-08 MED ORDER — PROPOFOL 10 MG/ML IV BOLUS
INTRAVENOUS | Status: DC | PRN
  Administered 2013-12-08: 150 mg via INTRAVENOUS

## 2013-12-08 MED ORDER — LIDOCAINE HCL (CARDIAC) 20 MG/ML IV SOLN
INTRAVENOUS | Status: DC | PRN
  Administered 2013-12-08: 80 mg via INTRAVENOUS

## 2013-12-08 MED ORDER — PHENYLEPHRINE 40 MCG/ML (10ML) SYRINGE FOR IV PUSH (FOR BLOOD PRESSURE SUPPORT)
PREFILLED_SYRINGE | INTRAVENOUS | Status: AC
  Filled 2013-12-08: qty 10

## 2013-12-08 MED ORDER — ONDANSETRON HCL 4 MG/2ML IJ SOLN
INTRAMUSCULAR | Status: AC
  Filled 2013-12-08: qty 2

## 2013-12-08 MED ORDER — SUCCINYLCHOLINE CHLORIDE 20 MG/ML IJ SOLN
INTRAMUSCULAR | Status: DC | PRN
  Administered 2013-12-08: 100 mg via INTRAVENOUS

## 2013-12-08 MED ORDER — HYDROMORPHONE HCL 1 MG/ML IJ SOLN: 0.2500 mg | INTRAMUSCULAR | Status: DC | PRN

## 2013-12-08 MED ORDER — PROMETHAZINE HCL 25 MG/ML IJ SOLN
INTRAMUSCULAR | Status: AC
  Filled 2013-12-08: qty 1

## 2013-12-08 MED ORDER — PROMETHAZINE HCL 25 MG/ML IJ SOLN
6.2500 mg | INTRAMUSCULAR | Status: DC | PRN
  Administered 2013-12-08: 6.25 mg via INTRAVENOUS

## 2013-12-08 MED ORDER — NEOSTIGMINE METHYLSULFATE 10 MG/10ML IV SOLN
INTRAVENOUS | Status: AC
  Filled 2013-12-08: qty 1

## 2013-12-08 MED ORDER — KCL IN DEXTROSE-NACL 20-5-0.45 MEQ/L-%-% IV SOLN
INTRAVENOUS | Status: AC
  Filled 2013-12-08: qty 1000

## 2013-12-08 MED ORDER — PHENYLEPHRINE HCL 10 MG/ML IJ SOLN
20.0000 mg | INTRAVENOUS | Status: DC | PRN
  Administered 2013-12-08: 15 ug/min via INTRAVENOUS

## 2013-12-08 MED ORDER — BUPIVACAINE LIPOSOME 1.3 % IJ SUSP
20.0000 mL | Freq: Once | INTRAMUSCULAR | Status: AC
  Administered 2013-12-08: 20 mL
  Filled 2013-12-08: qty 20

## 2013-12-08 MED ORDER — DEXTROSE 5 % IV SOLN
INTRAVENOUS | Status: AC
  Filled 2013-12-08: qty 2

## 2013-12-08 MED ORDER — FLUTICASONE PROPIONATE 50 MCG/ACT NA SUSP
1.0000 | Freq: Every day | NASAL | Status: DC | PRN
  Filled 2013-12-08: qty 16

## 2013-12-08 MED ORDER — ALBUTEROL SULFATE (2.5 MG/3ML) 0.083% IN NEBU: 2.5000 mg | INHALATION_SOLUTION | RESPIRATORY_TRACT | Status: DC | PRN

## 2013-12-08 MED ORDER — HEPARIN SODIUM (PORCINE) 5000 UNIT/ML IJ SOLN
5000.0000 [IU] | Freq: Three times a day (TID) | INTRAMUSCULAR | Status: DC
  Administered 2013-12-08 – 2013-12-11 (×8): 5000 [IU] via SUBCUTANEOUS
  Filled 2013-12-08 (×11): qty 1

## 2013-12-08 MED ORDER — GLYCOPYRROLATE 0.2 MG/ML IJ SOLN
INTRAMUSCULAR | Status: DC | PRN
  Administered 2013-12-08: .5 mg via INTRAVENOUS
  Administered 2013-12-08: 0.2 mg via INTRAVENOUS

## 2013-12-08 MED ORDER — EPHEDRINE SULFATE 50 MG/ML IJ SOLN
INTRAMUSCULAR | Status: DC | PRN
  Administered 2013-12-08: 5 mg via INTRAVENOUS
  Administered 2013-12-08: 10 mg via INTRAVENOUS

## 2013-12-08 MED ORDER — LIDOCAINE HCL (CARDIAC) 20 MG/ML IV SOLN
INTRAVENOUS | Status: AC
  Filled 2013-12-08: qty 5

## 2013-12-08 SURGICAL SUPPLY — 48 items
APPLIER CLIP ROT 10 11.4 M/L (STAPLE)
BENZOIN TINCTURE PRP APPL 2/3 (GAUZE/BANDAGES/DRESSINGS) IMPLANT
CABLE HIGH FREQUENCY MONO STRZ (ELECTRODE) IMPLANT
CLAMP ENDO BABCK 10MM (STAPLE) IMPLANT
CLIP APPLIE ROT 10 11.4 M/L (STAPLE) IMPLANT
CLOSURE WOUND 1/2 X4 (GAUZE/BANDAGES/DRESSINGS)
DECANTER SPIKE VIAL GLASS SM (MISCELLANEOUS) ×4 IMPLANT
DERMABOND ADVANCED (GAUZE/BANDAGES/DRESSINGS) ×2
DERMABOND ADVANCED .7 DNX12 (GAUZE/BANDAGES/DRESSINGS) ×2 IMPLANT
DEVICE SUT QUICK LOAD TK 5 (STAPLE) ×33 IMPLANT
DEVICE SUT TI-KNOT TK 5X26 (MISCELLANEOUS) IMPLANT
DEVICE SUTURE ENDOST 10MM (ENDOMECHANICALS) ×4 IMPLANT
DEVICE TI KNOT TK5 (MISCELLANEOUS)
DISSECTOR BLUNT TIP ENDO 5MM (MISCELLANEOUS) ×4 IMPLANT
DRAIN PENROSE 18X1/2 LTX STRL (DRAIN) ×4 IMPLANT
DRAPE LAPAROSCOPIC ABDOMINAL (DRAPES) ×4 IMPLANT
ELECT REM PT RETURN 9FT ADLT (ELECTROSURGICAL) ×4
ELECTRODE REM PT RTRN 9FT ADLT (ELECTROSURGICAL) ×2 IMPLANT
GLOVE BIOGEL M 8.0 STRL (GLOVE) ×4 IMPLANT
GOWN STRL REUS W/TWL XL LVL3 (GOWN DISPOSABLE) ×16 IMPLANT
GRASPER ENDO BABCOCK 10 (MISCELLANEOUS) IMPLANT
GRASPER ENDO BABCOCK 10MM (MISCELLANEOUS)
KIT BASIN OR (CUSTOM PROCEDURE TRAY) ×4 IMPLANT
MESH HERNIA 7X10 (Mesh General) ×4 IMPLANT
NEEDLE SPNL 22GX3.5 QUINCKE BK (NEEDLE) ×4 IMPLANT
PENCIL BUTTON HOLSTER BLD 10FT (ELECTRODE) IMPLANT
QUICK LOAD TK 5 (STAPLE) ×11
SCISSORS LAP 5X45 EPIX DISP (ENDOMECHANICALS) ×4 IMPLANT
SET IRRIG TUBING LAPAROSCOPIC (IRRIGATION / IRRIGATOR) ×4 IMPLANT
SHEARS HARMONIC ACE PLUS 45CM (MISCELLANEOUS) ×4 IMPLANT
SLEEVE ADV FIXATION 5X100MM (TROCAR) IMPLANT
SOLUTION ANTI FOG 6CC (MISCELLANEOUS) ×4 IMPLANT
STAPLER VISISTAT 35W (STAPLE) ×4 IMPLANT
STRIP CLOSURE SKIN 1/2X4 (GAUZE/BANDAGES/DRESSINGS) IMPLANT
SUT ETHIBOND 2 0 SH (SUTURE) ×8
SUT ETHIBOND 2 0 SH 36X2 (SUTURE) ×8 IMPLANT
SUT SURGIDAC NAB ES-9 0 48 120 (SUTURE) ×28 IMPLANT
SUT VIC AB 4-0 SH 18 (SUTURE) ×4 IMPLANT
TIP INNERVISION DETACH 40FR (MISCELLANEOUS) IMPLANT
TIP INNERVISION DETACH 50FR (MISCELLANEOUS) IMPLANT
TIP INNERVISION DETACH 56FR (MISCELLANEOUS) ×4 IMPLANT
TIPS INNERVISION DETACH 40FR (MISCELLANEOUS)
TRAY FOLEY CATH 14FRSI W/METER (CATHETERS) ×4 IMPLANT
TRAY LAPAROSCOPIC (CUSTOM PROCEDURE TRAY) ×4 IMPLANT
TROCAR ADV FIXATION 11X100MM (TROCAR) IMPLANT
TROCAR ADV FIXATION 5X100MM (TROCAR) IMPLANT
TROCAR BLADELESS OPT 5 100 (ENDOMECHANICALS) ×4 IMPLANT
TUBING FILTER THERMOFLATOR (ELECTROSURGICAL) ×4 IMPLANT

## 2013-12-08 NOTE — Op Note (Signed)
Surgeon: Kaylyn Lim, MD, FACS  Asst:  P.J. Marlou Starks, MD, FACS  Anes:  general  Procedure: Laparoscopic take down of large type III hiatus hernia with closure of the diaphragm, Cook bioabsorbable patch, and Nissen wrap over a #56 lighted bougie  Diagnosis: Large type III hiatus hernia  Complications: None noted  EBL:   10 cc  Drains: none  Description of Procedure:  The patient was taken to OR 1 at [facility].  After anesthesia was administered and the patient was prepped a timeout was performed.  Access to the abdomen was achieved through the left upper quadrant with a 5 mm Optiview. Second 5 mm was placed slightly above and to the left the umbilicus for the camera port and another 5 was placed laterally for the assistant port on the left. On the operative side the 12 was placed to the right umbilicus a 5 laterally and a 5 was used the upper midline for placement of the [facility] retractor. With a Nathanson retractor in place the defect was easily visualized and was quite large containing much of the gastric fundus.  The dissection began on the right side opening the gastrohepatic window and in reducing the stomach and taking down the sac at the margin with the right crus. I dissected this posteriorly and then over to the left crus. I then teased this sac away and carried my dissection anteriorly across the midline. At that point I went over and began taking down short gastrics began bringing the stomach down out of the chest. I carried this up to the left crus and then put a Penrose drain around the gastrohepatic junction and without traction was able to free the esophagus and get length into the abdomen. I excised some of the sac on the anterior lateral side of the stomach on the left. I preserved the anterior vagus nerve and did not chamfer with the posterior vagus nerve. The esophagus had good length into the abdomen. I went ahead and had anesthesia pass the 56 bougie and prior to doing that I began and  did the repair with 5 the 0 Surgidac's secure with Ty knots. The bougie was passed into the stomach it completely occupied the remaining opening indicating to me an adequate calibration of the hiatus.  We then pulled bougie back in the esophagus. I placed a Cook bowel absorbable patch that is cut to fit and placed the waist posteriorly and the 2 arms anteriorly to support the posterior approximation of the diaphragmatic structures. The diaphragm seemed to have good tissue reapproximation looked good. Following the line the mesh in place I tacked it anteriorly with 2 sutures to the right and left crura and then posteriorly to the closure so it had 3 point fixation and laid nicely.  I then looked at the stomach's anatomy and found a spot up on the cardia fundus region and noted it and went posteriorly grab that area and brought around and then completed the wrap after doing a shoeshine maneuver and seeing if these 2 areas were contiguous. With the 56 Bougie in Pl. I then secured the wrap with 3 sutures using Endo Stitch and Ty knots taking purchases along the esophagus on all 3. When completed a good nice looking wrap was present and remove the bougie at that point. We surveyed the abdomen everything appeared to be in order. No bleeding was noted. The Sherrie Sport was withdrawn. The ports were injected with Exparel encloses 4-0 Vicryl and Dermabond.  The patient tolerated the  procedure well and was taken to the PACU in stable condition.     Matt B. Hassell Done, [place], [facility] [place]

## 2013-12-08 NOTE — Progress Notes (Signed)
Patient blood pressure trending upward since admission to the floor,patient states he takes PO lotensin at home for HTN, but did not take it this AM before surgery.paged PA on call [facility], new orders received for IV lopressor PRN high BP. Will continue to monitor patient.C.Sael Furches,RN

## 2013-12-08 NOTE — Interval H&P Note (Signed)
History and Physical Interval Note:  12/08/2013 7:16 AM  Alex Oconnor  has presented today for surgery, with the diagnosis of large hiatal hernia  The various methods of treatment have been discussed with the patient and family. After consideration of risks, benefits and other options for treatment, the patient has consented to  Procedure(s): Jewell (N/A) as a surgical intervention .  The patient's history has been reviewed, patient examined, no change in status, stable for surgery.  I have reviewed the patient's chart and labs.  Questions were answered to the patient's satisfaction.     Jervis Trapani B

## 2013-12-08 NOTE — Anesthesia Postprocedure Evaluation (Signed)
  Anesthesia Post-op Note  Patient: Alex Oconnor  Procedure(s) Performed: Procedure(s) (LRB): LAPAROSCOPIC REPAIR OF HIATAL HERNIA (N/A) INSERTION OF MESH (N/A)  Patient Location: PACU  Anesthesia Type: General  Level of Consciousness: awake and alert   Airway and Oxygen Therapy: Patient Spontanous Breathing  Post-op Pain: mild  Post-op Assessment: Post-op Vital signs reviewed, Patient's Cardiovascular Status Stable, Respiratory Function Stable, Patent Airway and No signs of Nausea or vomiting  Last Vitals:  Filed Vitals:   12/08/13 1400  BP: 165/94  Pulse: 85  Temp: 36.6 C  Resp: 18    Post-op Vital Signs: stable   Complications: No apparent anesthesia complications

## 2013-12-08 NOTE — Transfer of Care (Signed)
Immediate Anesthesia Transfer of Care Note  Patient: Alex Oconnor  Procedure(s) Performed: Procedure(s): LAPAROSCOPIC REPAIR OF HIATAL HERNIA (N/A) INSERTION OF MESH (N/A)  Patient Location: PACU  Anesthesia Type:General  Level of Consciousness: awake, alert , oriented and patient cooperative  Airway & Oxygen Therapy: Patient Spontanous Breathing and Patient connected to face mask oxygen  Post-op Assessment: Report given to PACU RN, Post -op Vital signs reviewed and stable and Patient moving all extremities  Post vital signs: Reviewed and stable  Complications: No apparent anesthesia complications

## 2013-12-08 NOTE — H&P (View-Only) (Signed)
Chief Complaint:  Chest pressure and dysphagia  History of Present Illness:  Alex Oconnor is an 80 y.o. male seen by me on 11/10/13 for the above complaints after workup and referral by Dr. Cristina Gong.  He had GERD for several years and as his hiatal hernia has grown worse, the GERD has been replaced with more pressure sensations in his chest.  He was seen and informed consent was obtained regarding laparoscopic and open hiatal hernia repair with Nissen fundoplication.    Past Medical History  Diagnosis Date  . Hypertension   . TIA (transient ischemic attack)     11/2011  . Hyperlipemia   . Gastroparesis   . Headache(784.0)     saw in ED 08/2006  . Sleep apnea     10/2001  . Rotator cuff disorder   . Asthma   . Coronary artery disease   . GERD (gastroesophageal reflux disease)   . Irritable bowel syndrome     Past Surgical History  Procedure Laterality Date  . Carotid endarterectomy      right side 1997  . Cardiac catheterization      May 1999  . Cardiovascular stress test      10/1999;09/2002  . Hernia repair      2001  . Knee arthroscopy      right knee 06/2002;12/2005-left knee  . Abd ultrasound       03/2007-xray of abd;01/2008-ultrasound of abd;01/2009-ultrasound of abd  . Colonscopy      02/2007  . Back surgery      MRI lower back 09/2010;back injected 10/2010;11/2010 spinal lumbar fusion L4-5  . Injection of knees      06/2012 both knees injected;10/2013 both knees injected   . Right hip injected      08/2013  . Eyelid surgery      removal of ingrown eyelashes  . Colonoscopy      Current Outpatient Prescriptions  Medication Sig Dispense Refill  . albuterol (PROVENTIL HFA;VENTOLIN HFA) 108 (90 BASE) MCG/ACT inhaler Inhale 1 puff into the lungs every 4 (four) hours as needed for wheezing or shortness of breath.      Marland Kitchen aspirin 81 MG tablet Take 81 mg by mouth daily. Pt was taking daily stopped taking two days ago (11/16/2013)      . benazepril (LOTENSIN) 20 MG tablet Take  20 mg by mouth every morning.       . benazepril-hydrochlorthiazide (LOTENSIN HCT) 20-25 MG per tablet Take 20 tablets by mouth once.      . Budesonide (PULMICORT IN) Inhale 1 puff into the lungs once as needed (allergies).      . cholecalciferol (VITAMIN D) 1000 UNITS tablet Take 1,000 Units by mouth daily.      . fexofenadine (ALLEGRA) 180 MG tablet Take 180 mg by mouth daily as needed for allergies.       . fluticasone (FLONASE) 50 MCG/ACT nasal spray Place 1 spray into both nostrils daily as needed for allergies or rhinitis.      . Lactobacillus Rhamnosus, GG, (CULTURELLE FOR KIDS) 10 B CELL CAPS Take by mouth once.      Marland Kitchen LORazepam (ATIVAN) 1 MG tablet Take 0.5 mg by mouth at bedtime.      . Misc Natural Products (OSTEO BI-FLEX ADV TRIPLE ST PO) Take 1 tablet by mouth daily.      . Multiple Vitamins-Minerals (CENTRUM SILVER) tablet Take by mouth.      Marland Kitchen omeprazole-sodium bicarbonate (ZEGERID) 40-1100 MG per capsule Take 1  capsule by mouth daily before breakfast.      . Probiotic Product (PROBIOTIC DAILY PO) Take by mouth. Takes cutrelle 1 daily orally      . saw palmetto 500 MG capsule Take 500 mg by mouth daily.      . simvastatin (ZOCOR) 20 MG tablet Take 20 mg by mouth every morning.       . vitamin C (ASCORBIC ACID) 500 MG tablet Take 500 mg by mouth.       No current facility-administered medications for this visit.   Penicillins and Plavix History reviewed. No pertinent family history. Social History:   reports that he quit smoking about 33 years ago. He has never used smokeless tobacco. He reports that he drinks alcohol. He reports that he does not use illicit drugs.   REVIEW OF SYSTEMS : Negative except for pertinent positives in HPI  Physical Exam:   There were no vitals taken for this visit. There is no weight on file to calculate BMI.  Gen:  WDWN WM NAD  Neurological: Alert and oriented to person, place, and time. Motor and sensory function is grossly intact  Head:  Normocephalic and atraumatic.  Eyes: Conjunctivae are normal. Pupils are equal, round, and reactive to light. No scleral icterus.  Neck: Normal range of motion. Neck supple. No tracheal deviation or thyromegaly present.  Cardiovascular:  SR without murmurs or gallops.  No carotid bruits Breast:  No masses Respiratory: Effort normal.  No respiratory distress. No chest wall tenderness. Breath sounds normal.  No wheezes, rales or rhonchi.  Abdomen:  Flat and nontender GU:  Did not examine Musculoskeletal: Normal range of motion. Extremities are nontender. No cyanosis, edema or clubbing noted Lymphadenopathy: No cervical, preauricular, postauricular or axillary adenopathy is present Skin: Skin is warm and dry. No rash noted. No diaphoresis. No erythema. No pallor. Pscyh: Normal mood and affect. Behavior is normal. Judgment and thought content normal.   LABORATORY RESULTS: No results found for this or any previous visit (from the past 48 hour(s)).   RADIOLOGY RESULTS: No results found.  Problem List: Patient Active Problem List   Diagnosis Date Noted  . 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 TIA (transient ischemic attack) 12/06/2011  . Transient global amnesia 12/06/2011  . Bacterial overgrowth syndrome 12/06/2011  . Hypertension 12/06/2011  . H/O carotid artery stenosis 12/06/2011  . CAD (coronary artery disease) 12/06/2011    Assessment & Plan: Large type III hiatal hernia.  Plan lap repair.      Matt B. Hassell Done, MD, [facility] [phone] beeper [phone]  12/07/2013 6:55 PM

## 2013-12-09 ENCOUNTER — Inpatient Hospital Stay (HOSPITAL_COMMUNITY): Payer: Medicare Other

## 2013-12-09 ENCOUNTER — Encounter (HOSPITAL_COMMUNITY): Payer: Self-pay | Admitting: Surgery

## 2013-12-09 LAB — CBC
HCT: 39.6 % (ref 39.0–52.0)
HEMOGLOBIN: 13.6 g/dL (ref 13.0–17.0)
MCH: 30.4 pg (ref 26.0–34.0)
MCHC: 34.3 g/dL (ref 30.0–36.0)
MCV: 88.6 fL (ref 78.0–100.0)
PLATELETS: 258 10*3/uL (ref 150–400)
RBC: 4.47 MIL/uL (ref 4.22–5.81)
RDW: 12.6 % (ref 11.5–15.5)
WBC: 17.5 10*3/uL — ABNORMAL HIGH (ref 4.0–10.5)

## 2013-12-09 LAB — BASIC METABOLIC PANEL
Anion gap: 15 (ref 5–15)
BUN: 16 mg/dL (ref 6–23)
CO2: 23 mEq/L (ref 19–32)
Calcium: 9.1 mg/dL (ref 8.4–10.5)
Chloride: 100 mEq/L (ref 96–112)
Creatinine, Ser: 1.11 mg/dL (ref 0.50–1.35)
GFR calc Af Amer: 71 mL/min — ABNORMAL LOW (ref >90)
GFR calc non Af Amer: 61 mL/min — ABNORMAL LOW (ref >90)
GLUCOSE: 137 mg/dL — AB (ref 70–99)
Potassium: 4.5 mEq/L (ref 3.7–5.3)
Sodium: 138 mEq/L (ref 137–147)

## 2013-12-09 MED ORDER — BUDESONIDE 0.5 MG/2ML IN SUSP
0.5000 mg | Freq: Every day | RESPIRATORY_TRACT | Status: DC
  Administered 2013-12-10 – 2013-12-11 (×2): 0.5 mg via RESPIRATORY_TRACT
  Filled 2013-12-09 (×5): qty 2

## 2013-12-09 MED ORDER — LORAZEPAM BOLUS VIA INFUSION: 0.5000 mg | Freq: Every day | INTRAVENOUS | Status: DC

## 2013-12-09 MED ORDER — LORAZEPAM 2 MG/ML IJ SOLN: 0.5000 mg | Freq: Every day | INTRAMUSCULAR | Status: DC

## 2013-12-09 MED ORDER — LORAZEPAM 0.5 MG PO TABS
0.5000 mg | ORAL_TABLET | Freq: Every day | ORAL | Status: DC
  Administered 2013-12-09 – 2013-12-10 (×2): 0.5 mg via ORAL
  Filled 2013-12-09 (×2): qty 1

## 2013-12-09 MED ORDER — IOHEXOL 300 MG/ML  SOLN
50.0000 mL | Freq: Once | INTRAMUSCULAR | Status: AC | PRN
  Administered 2013-12-09: 50 mL via ORAL

## 2013-12-09 MED ORDER — BENAZEPRIL HCL 20 MG PO TABS
20.0000 mg | ORAL_TABLET | Freq: Every day | ORAL | Status: DC
  Administered 2013-12-10: 20 mg via ORAL
  Filled 2013-12-09 (×2): qty 1

## 2013-12-09 NOTE — Progress Notes (Signed)
Patient ID: Alex Oconnor, male   DOB: 1932/04/05, 80 y.o.   MRN: [id number] [facility] Progress Note:   1 Day Post-Op  Subjective: Mental status is clear.  No complaints Objective: Vital signs in last 24 hours: Temp:  [97.4 F (36.3 C)-99.4 F (37.4 C)] 98.3 F (36.8 C) (10/16 0948) Pulse Rate:  [57-94] 71 (10/16 0948) Resp:  [14-24] 16 (10/16 0948) BP: (140-173)/(72-106) 145/83 mmHg (10/16 0948) SpO2:  [90 %-100 %] 97 % (10/16 0948)  Intake/Output from previous day: 10/15 0701 - 10/16 0700 In: 3100 [I.V.:3100] Out: 2775 [Urine:2760; Blood:15] Intake/Output this shift:    Physical Exam: Work of breathing is normal.  Incisions ok  Lab Results:  Results for orders placed during the hospital encounter of 12/08/13 (from the past 48 hour(s))  CBC     Status: Abnormal   Collection Time    12/08/13 12:05 PM      Result Value Ref Range   WBC 17.0 (*) 4.0 - 10.5 K/uL   Comment: WHITE COUNT CONFIRMED ON SMEAR   RBC 4.42  4.22 - 5.81 MIL/uL   Hemoglobin 13.8  13.0 - 17.0 g/dL   HCT 72.0  94.7 - 09.6 %   MCV 88.2  78.0 - 100.0 fL   MCH 31.2  26.0 - 34.0 pg   MCHC 35.4  30.0 - 36.0 g/dL   RDW 28.3  66.2 - 94.7 %   Platelets 200  150 - 400 K/uL   Comment: PLATELET CLUMPS NOTED ON SMEAR  CREATININE, SERUM     Status: Abnormal   Collection Time    12/08/13 12:05 PM      Result Value Ref Range   Creatinine, Ser 1.07  0.50 - 1.35 mg/dL   GFR calc non Af Amer 64 (*) >90 mL/min   GFR calc Af Amer 74 (*) >90 mL/min   Comment: (NOTE)     The eGFR has been calculated using the CKD EPI equation.     This calculation has not been validated in all clinical situations.     eGFR's persistently <90 mL/min signify possible Chronic Kidney     Disease.  CBC     Status: Abnormal   Collection Time    12/09/13  4:45 AM      Result Value Ref Range   WBC 17.5 (*) 4.0 - 10.5 K/uL   RBC 4.47  4.22 - 5.81 MIL/uL   Hemoglobin 13.6  13.0 - 17.0 g/dL   HCT 65.4  65.0 - 35.4 %   MCV  88.6  78.0 - 100.0 fL   MCH 30.4  26.0 - 34.0 pg   MCHC 34.3  30.0 - 36.0 g/dL   RDW 65.6  81.2 - 75.1 %   Platelets 258  150 - 400 K/uL  BASIC METABOLIC PANEL     Status: Abnormal   Collection Time    12/09/13  4:45 AM      Result Value Ref Range   Sodium 138  137 - 147 mEq/L   Potassium 4.5  3.7 - 5.3 mEq/L   Chloride 100  96 - 112 mEq/L   CO2 23  19 - 32 mEq/L   Glucose, Bld 137 (*) 70 - 99 mg/dL   BUN 16  6 - 23 mg/dL   Creatinine, Ser 7.00  0.50 - 1.35 mg/dL   Calcium 9.1  8.4 - 17.4 mg/dL   GFR calc non Af Amer 61 (*) >90 mL/min   GFR calc Af Denyse Dago  71 (*) >90 mL/min   Comment: (NOTE)     The eGFR has been calculated using the CKD EPI equation.     This calculation has not been validated in all clinical situations.     eGFR's persistently <90 mL/min signify possible Chronic Kidney     Disease.   Anion gap 15  5 - 15    Radiology/Results: Dg Ugi W/water Sol Cm  12/09/2013   CLINICAL DATA:  80 year old male status post hiatal hernia repair with Nissen fundoplication.  EXAM: WATER SOLUBLE UPPER GI SERIES  TECHNIQUE: Single-column upper GI series was performed using water soluble contrast.  CONTRAST:  97mL OMNIPAQUE IOHEXOL 300 MG/ML  SOLN  COMPARISON:  Preoperative upper GI 10/24/2013.  FLUOROSCOPY TIME:  41 seconds.  FINDINGS: Initial KUB demonstrates postoperative changes of PLIF in the lower lumbar spine. Multiple foci of high attenuation are noted in the outer aspects of the 4 quadrants of the abdomen, favored to reflect fecalith and/or retained oral contrast material within multiple colonic diverticuli. Bowel gas pattern is nonobstructive.  Patient ingested a small amount of water-soluble contrast, and multiple fluoroscopic images were obtained demonstrating a normal appearance of the esophagus with a focal narrowing at the level of the gastroesophageal junction related to Nissen fundoplication. Contrast readily traversed the area of fundoplication without evidence of obstruction.  Limited images of the stomach demonstrated a normal appearance of the stomach. No contrast extravasation was noted during this examination.  IMPRESSION: 1. Expected postoperative appearance of patient status post Nissen fundoplication, without complicating features, as above.   Electronically Signed   By: Vinnie Langton M.D.   On: 12/09/2013 09:32    Anti-infectives: Anti-infectives   Start     Dose/Rate Route Frequency Ordered Stop   12/08/13 0730  levofloxacin (LEVAQUIN) IVPB 500 mg  Status:  Discontinued     500 mg 100 mL/hr over 60 Minutes Intravenous Every 24 hours 12/08/13 0715 12/08/13 1152   12/08/13 0533  cefOXitin (MEFOXIN) 2 g in dextrose 5 % 50 mL IVPB  Status:  Discontinued     2 g 100 mL/hr over 30 Minutes Intravenous On call to O.R. 12/08/13 0533 12/08/13 1146      Assessment/Plan: Problem List: Patient Active Problem List   Diagnosis Date Noted  . Lap Repair of large type III hiatus hernia with Nissen fundoplication over a #86 bougie Oct 2015 12/08/2013  . Hiatus hernia syndrome 12/08/2013  . 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 TIA (transient ischemic attack) 12/06/2011  . Transient global amnesia 12/06/2011  . Bacterial overgrowth syndrome 12/06/2011  . Hypertension 12/06/2011  . H/O carotid artery stenosis 12/06/2011  . CAD (coronary artery disease) 12/06/2011    UGI ok.  Begin clear liquids.   1 Day Post-Op    LOS: 1 day   Matt B. Hassell Done, MD, [facility] [phone] beeper [phone]  12/09/2013 9:56 AM

## 2013-12-10 LAB — CBC WITH DIFFERENTIAL/PLATELET
BASOS ABS: 0 10*3/uL (ref 0.0–0.1)
BASOS PCT: 0 % (ref 0–1)
EOS ABS: 0.1 10*3/uL (ref 0.0–0.7)
EOS PCT: 1 % (ref 0–5)
HCT: 40.7 % (ref 39.0–52.0)
Hemoglobin: 13.7 g/dL (ref 13.0–17.0)
LYMPHS PCT: 17 % (ref 12–46)
Lymphs Abs: 2.2 10*3/uL (ref 0.7–4.0)
MCH: 30.4 pg (ref 26.0–34.0)
MCHC: 33.7 g/dL (ref 30.0–36.0)
MCV: 90.4 fL (ref 78.0–100.0)
Monocytes Absolute: 1 10*3/uL (ref 0.1–1.0)
Monocytes Relative: 8 % (ref 3–12)
Neutro Abs: 9.8 10*3/uL — ABNORMAL HIGH (ref 1.7–7.7)
Neutrophils Relative %: 74 % (ref 43–77)
PLATELETS: 230 10*3/uL (ref 150–400)
RBC: 4.5 MIL/uL (ref 4.22–5.81)
RDW: 12.8 % (ref 11.5–15.5)
WBC: 13 10*3/uL — AB (ref 4.0–10.5)

## 2013-12-10 LAB — BASIC METABOLIC PANEL
Anion gap: 15 (ref 5–15)
BUN: 14 mg/dL (ref 6–23)
CALCIUM: 9 mg/dL (ref 8.4–10.5)
CO2: 22 mEq/L (ref 19–32)
Chloride: 101 mEq/L (ref 96–112)
Creatinine, Ser: 1.09 mg/dL (ref 0.50–1.35)
GFR calc Af Amer: 73 mL/min — ABNORMAL LOW (ref >90)
GFR calc non Af Amer: 63 mL/min — ABNORMAL LOW (ref >90)
GLUCOSE: 110 mg/dL — AB (ref 70–99)
Potassium: 4.1 mEq/L (ref 3.7–5.3)
SODIUM: 138 meq/L (ref 137–147)

## 2013-12-10 NOTE — Progress Notes (Signed)
Patient ID: Alex Oconnor, male   DOB: 02/05/1933, 80 y.o.   MRN: [id number] 2 Days Post-Op  Subjective: Patient generally without complaints. He however has been taking not a large amount of clear liquids and having some nausea. Breathing okay. Voiding a little frequently but adequate amounts.  Objective: Vital signs in last 24 hours: Temp:  [97.8 F (36.6 C)-98.9 F (37.2 C)] 98.8 F (37.1 C) (10/17 0536) Pulse Rate:  [66-86] 85 (10/17 0536) Resp:  [15-18] 18 (10/17 0536) BP: (145-166)/(83-96) 147/95 mmHg (10/17 0536) SpO2:  [90 %-97 %] 95 % (10/17 0841)    Intake/Output from previous day: 10/16 0701 - 10/17 0700 In: 3275.4 [P.O.:600; I.V.:2675.4] Out: 3825 [Urine:3825] Intake/Output this shift:    General appearance: alert, cooperative and no distress Resp: clear to auscultation bilaterally GI: abnormal findings:  mild tenderness in the epigastrium Incision/Wound: clean and dry  Lab Results:   Recent Labs  12/09/13 0445 12/10/13 0505  WBC 17.5* 13.0*  HGB 13.6 13.7  HCT 39.6 40.7  PLT 258 230   BMET  Recent Labs  12/09/13 0445 12/10/13 0505  NA 138 138  K 4.5 4.1  CL 100 101  CO2 23 22  GLUCOSE 137* 110*  BUN 16 14  CREATININE 1.11 1.09  CALCIUM 9.1 9.0     Studies/Results: Dg Ugi W/water Sol Cm  12/09/2013   CLINICAL DATA:  80 year old male status post hiatal hernia repair with Nissen fundoplication.  EXAM: WATER SOLUBLE UPPER GI SERIES  TECHNIQUE: Single-column upper GI series was performed using water soluble contrast.  CONTRAST:  66mL OMNIPAQUE IOHEXOL 300 MG/ML  SOLN  COMPARISON:  Preoperative upper GI 10/24/2013.  FLUOROSCOPY TIME:  41 seconds.  FINDINGS: Initial KUB demonstrates postoperative changes of PLIF in the lower lumbar spine. Multiple foci of high attenuation are noted in the outer aspects of the 4 quadrants of the abdomen, favored to reflect fecalith and/or retained oral contrast material within multiple colonic diverticuli. Bowel gas  pattern is nonobstructive.  Patient ingested a small amount of water-soluble contrast, and multiple fluoroscopic images were obtained demonstrating a normal appearance of the esophagus with a focal narrowing at the level of the gastroesophageal junction related to Nissen fundoplication. Contrast readily traversed the area of fundoplication without evidence of obstruction. Limited images of the stomach demonstrated a normal appearance of the stomach. No contrast extravasation was noted during this examination.  IMPRESSION: 1. Expected postoperative appearance of patient status post Nissen fundoplication, without complicating features, as above.   Electronically Signed   By: Vinnie Langton M.D.   On: 12/09/2013 09:32    Anti-infectives: Anti-infectives   Start     Dose/Rate Route Frequency Ordered Stop   12/08/13 0730  levofloxacin (LEVAQUIN) IVPB 500 mg  Status:  Discontinued     500 mg 100 mL/hr over 60 Minutes Intravenous Every 24 hours 12/08/13 0715 12/08/13 1152   12/08/13 0533  cefOXitin (MEFOXIN) 2 g in dextrose 5 % 50 mL IVPB  Status:  Discontinued     2 g 100 mL/hr over 30 Minutes Intravenous On call to O.R. 12/08/13 0533 12/08/13 1146      Assessment/Plan: s/p Procedure(s): LAPAROSCOPIC REPAIR OF HIATAL HERNIA INSERTION OF MESH No apparent intra-abdominal complications. Probable mild ileus. Mild urinary intention that is improving. Observe today and probably home tomorrow   LOS: 2 days    Gagandeep Pettet T 12/10/2013

## 2013-12-11 MED ORDER — HYDROCODONE-ACETAMINOPHEN 7.5-325 MG/15ML PO SOLN: 10.0000 mL | ORAL | Status: DC | PRN

## 2013-12-11 NOTE — Plan of Care (Signed)
Problem: Phase II Progression Outcomes Goal: Return of bowel function (flatus, BM) IF ABDOMINAL SURGERY:  Outcome: Progressing + flatus     

## 2013-12-11 NOTE — Plan of Care (Signed)
Problem: Phase III Progression Outcomes Goal: IV changed to normal saline lock Outcome: Not Applicable Date Met:  32/00/37 IV d/c

## 2013-12-11 NOTE — Progress Notes (Signed)
Patient ID: Alex Oconnor, male   DOB: Oct 10, 1932, 80 y.o.   MRN: [id number] 3 Days Post-Op  Subjective: Feels well, no nausea or pain.  Objective: Vital signs in last 24 hours: Temp:  [98.1 F (36.7 C)-98.6 F (37 C)] 98.5 F (36.9 C) (10/18 0623) Pulse Rate:  [68-103] 68 (10/18 0623) Resp:  [16-18] 16 (10/18 0623) BP: (123-158)/(80-96) 123/87 mmHg (10/18 0623) SpO2:  [95 %-98 %] 95 % (10/18 0623) Last BM Date:  (PTA)  Intake/Output from previous day: 10/17 0701 - 10/18 0700 In: 3273.8 [P.O.:1440; I.V.:1833.8] Out: 1570 [Urine:1570] Intake/Output this shift:    General appearance: alert, cooperative and no distress GI: Soft and nontender Incision/Wound: Clean and dry  Lab Results:   Recent Labs  12/09/13 0445 12/10/13 0505  WBC 17.5* 13.0*  HGB 13.6 13.7  HCT 39.6 40.7  PLT 258 230   BMET  Recent Labs  12/09/13 0445 12/10/13 0505  NA 138 138  K 4.5 4.1  CL 100 101  CO2 23 22  GLUCOSE 137* 110*  BUN 16 14  CREATININE 1.11 1.09  CALCIUM 9.1 9.0     Studies/Results: Dg Ugi W/water Sol Cm  12/09/2013   CLINICAL DATA:  80 year old male status post hiatal hernia repair with Nissen fundoplication.  EXAM: WATER SOLUBLE UPPER GI SERIES  TECHNIQUE: Single-column upper GI series was performed using water soluble contrast.  CONTRAST:  37mL OMNIPAQUE IOHEXOL 300 MG/ML  SOLN  COMPARISON:  Preoperative upper GI 10/24/2013.  FLUOROSCOPY TIME:  41 seconds.  FINDINGS: Initial KUB demonstrates postoperative changes of PLIF in the lower lumbar spine. Multiple foci of high attenuation are noted in the outer aspects of the 4 quadrants of the abdomen, favored to reflect fecalith and/or retained oral contrast material within multiple colonic diverticuli. Bowel gas pattern is nonobstructive.  Patient ingested a small amount of water-soluble contrast, and multiple fluoroscopic images were obtained demonstrating a normal appearance of the esophagus with a focal narrowing at the level  of the gastroesophageal junction related to Nissen fundoplication. Contrast readily traversed the area of fundoplication without evidence of obstruction. Limited images of the stomach demonstrated a normal appearance of the stomach. No contrast extravasation was noted during this examination.  IMPRESSION: 1. Expected postoperative appearance of patient status post Nissen fundoplication, without complicating features, as above.   Electronically Signed   By: Vinnie Langton M.D.   On: 12/09/2013 09:32    Anti-infectives: Anti-infectives   Start     Dose/Rate Route Frequency Ordered Stop   12/08/13 0730  levofloxacin (LEVAQUIN) IVPB 500 mg  Status:  Discontinued     500 mg 100 mL/hr over 60 Minutes Intravenous Every 24 hours 12/08/13 0715 12/08/13 1152   12/08/13 0533  cefOXitin (MEFOXIN) 2 g in dextrose 5 % 50 mL IVPB  Status:  Discontinued     2 g 100 mL/hr over 30 Minutes Intravenous On call to O.R. 12/08/13 0533 12/08/13 1146      Assessment/Plan: s/p Procedure(s): LAPAROSCOPIC REPAIR OF HIATAL HERNIA INSERTION OF MESH Doing well without complication. Okay for discharge.   LOS: 3 days    Nelda Luckey T 12/11/2013

## 2013-12-11 NOTE — Discharge Instructions (Signed)
[facility] ______[facility] °LAPAROSCOPIC SURGERY: POST OP INSTRUCTIONS °Always review your discharge instruction sheet given to you by the facility where your surgery was performed. °IF YOU HAVE DISABILITY OR FAMILY LEAVE FORMS, YOU MUST BRING THEM TO THE OFFICE FOR PROCESSING.   °DO NOT GIVE THEM TO YOUR DOCTOR. ° °1. A prescription for pain medication may be given to you upon discharge.  Take your pain medication as prescribed, if needed.  If narcotic pain medicine is not needed, then you may take acetaminophen (Tylenol) or ibuprofen (Advil) as needed. °2. Take your usually prescribed medications unless otherwise directed. °3. If you need a refill on your pain medication, please contact your pharmacy.  They will contact our office to request authorization. Prescriptions will not be filled after 5pm or on week-ends. °4. You should follow a light diet the first few days after arrival home, such as soup and crackers, etc.  Be sure to include lots of fluids daily. °5. Most patients will experience some swelling and bruising in the area of the incisions.  Ice packs will help.  Swelling and bruising can take several days to resolve.  °6. It is common to experience some constipation if taking pain medication after surgery.  Increasing fluid intake and taking a stool softener (such as Colace) will usually help or prevent this problem from occurring.  A mild laxative (Milk of Magnesia or Miralax) should be taken according to package instructions if there are no bowel movements after 48 hours. °7. Unless discharge instructions indicate otherwise, you may remove your bandages 24-48 hours after surgery, and you may shower at that time.  You may have steri-strips (small skin tapes) in place directly over the incision.  These strips should be left on the skin for 7-10 days.  If your surgeon used skin glue on the incision, you may shower in 24 hours.  The glue will flake off over the next 2-3 weeks.  Any sutures or  staples will be removed at the office during your follow-up visit. °8. ACTIVITIES:  You may resume regular (light) daily activities beginning the next day--such as daily self-care, walking, climbing stairs--gradually increasing activities as tolerated.  You may have sexual intercourse when it is comfortable.  Refrain from any heavy lifting or straining until approved by your doctor. °a. You may drive when you are no longer taking prescription pain medication, you can comfortably wear a seatbelt, and you can safely maneuver your car and apply brakes. °b. RETURN TO WORK:  __________________________________________________________ °9. You should see your doctor in the office for a follow-up appointment approximately 2-3 weeks after your surgery.  Make sure that you call for this appointment within a day or two after you arrive home to insure a convenient appointment time. °10. OTHER INSTRUCTIONS: __________________________________________________________________________________________________________________________ __________________________________________________________________________________________________________________________ °WHEN TO CALL YOUR DOCTOR: °1. Fever over 101.0 °2. Inability to urinate °3. Continued bleeding from incision. °4. Increased pain, redness, or drainage from the incision. °5. Increasing abdominal pain ° °The clinic staff is available to answer your questions during regular business hours.  Please don’t hesitate to call and ask to speak to one of the nurses for clinical concerns.  If you have a medical emergency, go to the nearest emergency room or call 911.  A surgeon from [facility] is always on call at the hospital. °[address], [address], [place], [place]  [address] ? [address] [address], [place],    [address] °[phone] ? [phone] ? FAX [phone] °Web site:   [email or web address] ______[facility] LAPAROSCOPIC  SURGERY: POST OP INSTRUCTIONS Always review your discharge instruction sheet given to you by the facility where your surgery was performed. IF YOU HAVE DISABILITY OR FAMILY LEAVE FORMS, YOU MUST BRING THEM TO THE OFFICE FOR PROCESSING.   DO NOT GIVE THEM TO YOUR DOCTOR.  11. A prescription for pain medication may be given to you upon discharge.  Take your pain medication as prescribed, if needed.  If narcotic pain medicine is not needed, then you may take acetaminophen (Tylenol) or ibuprofen (Advil) as needed. 12. Take your usually prescribed medications unless otherwise directed. 13. If you need a refill on your pain medication, please contact your pharmacy.  They will contact our office to request authorization. Prescriptions will not be filled after 5pm or on week-ends. 14. You should follow a light diet the first few days after arrival home, such as soup and crackers, etc.  Be sure to include lots of fluids daily. 15. Most patients will experience some swelling and bruising in the area of the incisions.  Ice packs will help.  Swelling and bruising can take several days to resolve.  16. It is common to experience some constipation if taking pain medication after surgery.  Increasing fluid intake and taking a stool softener (such as Colace) will usually help or prevent this problem from occurring.  A mild laxative (Milk of Magnesia or Miralax) should be taken according to package instructions if there are no bowel movements after 48 hours. 17. Unless discharge instructions indicate otherwise, you may remove your bandages 24-48 hours after surgery, and you may shower at that time.  You may have steri-strips (small skin tapes) in place directly over the incision.  These strips should be left on the skin for 7-10 days.  If your surgeon used skin glue on the incision, you may shower in 24 hours.  The glue will flake off over the next 2-3 weeks.  Any sutures or staples will be removed at the office during  your follow-up visit. 18. ACTIVITIES:  You may resume regular (light) daily activities beginning the next day--such as daily self-care, walking, climbing stairs--gradually increasing activities as tolerated.  You may have sexual intercourse when it is comfortable.  Refrain from any heavy lifting or straining until approved by your doctor. a. You may drive when you are no longer taking prescription pain medication, you can comfortably wear a seatbelt, and you can safely maneuver your car and apply brakes. b. RETURN TO WORK:  __________________________________________________________ 19. You should see your doctor in the office for a follow-up appointment approximately 2-3 weeks after your surgery.  Make sure that you call for this appointment within a day or two after you arrive home to insure a convenient appointment time. 20. OTHER INSTRUCTIONS: __________________________________________________________________________________________________________________________ __________________________________________________________________________________________________________________________ WHEN TO CALL YOUR DOCTOR: 6. Fever over 101.0 7. Inability to urinate 8. Continued bleeding from incision. 9. Increased pain, redness, or drainage from the incision. 10. Increasing abdominal pain  The clinic staff is available to answer your questions during regular business hours.  Please dont hesitate to call and ask to speak to one of the nurses for clinical concerns.  If you have a medical emergency, go to the nearest emergency room or call 911.  A surgeon from [facility] is always on call at the hospital. [address], [place], [place], [place]  [address] ? [address], [place], [place]   [address] [phone] ? [phone] ? FAX [phone] Web  site: [email or web address]  

## 2013-12-11 NOTE — Discharge Summary (Signed)
Reviewed discharge instructions with patient and spouse including follow up appointment, medications, and precautions.  Verbalized understanding of all topics discussed. No questions asked.  Pt being d/c into care of spouse.

## 2013-12-16 DIAGNOSIS — R140 Abdominal distension (gaseous): Secondary | ICD-10-CM | POA: Diagnosis not present

## 2013-12-19 NOTE — Discharge Summary (Signed)
Physician Discharge Summary  Patient ID: Alex Oconnor MRN: [id number] DOB/AGE: [birth year] 80 y.o.  Admit date: 12/08/2013 Discharge date: 12/11/2013  Admission Diagnoses:  Large type III mixed hiatus hernia  Discharge Diagnoses:  same  Principal Problem:   Lap Repair of large type III hiatus hernia with Nissen fundoplication over a #89 bougie Oct 2015 Active Problems:   Hiatus hernia syndrome   Surgery:  Lap Repair of large type III hiatus hernia with Nissen fundoplication over a #38 bougie  Discharged Condition: improved  Hospital Course:   Had surgery.  UGI on PD 1 looked good.  Diet begun and slowly advanced.    Consults: none  Significant Diagnostic Studies: UGI    Discharge Exam: Blood pressure 123/87, pulse 68, temperature 98.5 F (36.9 C), temperature source Oral, resp. rate 16, height 5\' 8"  (1.727 m), weight 157 lb 2 oz (71.271 kg), SpO2 95.00%. Incisions OK  Disposition: 01-Home or Self Care  Discharge Instructions   Discharge patient    Complete by:  As directed             Medication List         albuterol 108 (90 BASE) MCG/ACT inhaler  Commonly known as:  PROVENTIL HFA;VENTOLIN HFA  Inhale 1 puff into the lungs every 4 (four) hours as needed for wheezing or shortness of breath.     aspirin 81 MG tablet  Take 81 mg by mouth daily. Pt was taking daily stopped taking two days ago (11/16/2013)     benazepril 20 MG tablet  Commonly known as:  LOTENSIN  Take 20 mg by mouth every morning.     cholecalciferol 1000 UNITS tablet  Commonly known as:  VITAMIN D  Take 1,000 Units by mouth daily.     CULTURELLE FOR KIDS 10 B CELL Caps  Take by mouth once.     fexofenadine 180 MG tablet  Commonly known as:  ALLEGRA  Take 180 mg by mouth daily as needed for allergies.     fluticasone 50 MCG/ACT nasal spray  Commonly known as:  FLONASE  Place 1 spray into both nostrils daily as needed for allergies or rhinitis.     HYDROcodone-acetaminophen 7.5-325  mg/15 ml solution  Commonly known as:  HYCET  Take 10 mLs by mouth every 4 (four) hours as needed for moderate pain.     LORazepam 1 MG tablet  Commonly known as:  ATIVAN  Take 0.5 mg by mouth at bedtime.     naphazoline-glycerin 0.012-0.2 % Soln  Commonly known as:  CLEAR EYES  Place 1-2 drops into both eyes every 4 (four) hours as needed for irritation.     omeprazole-sodium bicarbonate 40-1100 MG per capsule  Commonly known as:  ZEGERID  Take 1 capsule by mouth daily before breakfast.     OSTEO BI-FLEX ADV TRIPLE ST PO  Take 1 tablet by mouth daily.     PULMICORT IN  Inhale 1 puff into the lungs once as needed (allergies).     saw palmetto 500 MG capsule  Take 500 mg by mouth daily.     simvastatin 20 MG tablet  Commonly known as:  ZOCOR  Take 20 mg by mouth every morning.     vitamin C 500 MG tablet  Commonly known as:  ASCORBIC ACID  Take 500 mg by mouth.           Follow-up Information   Follow up with Pedro Earls, MD. Schedule an appointment as soon as possible  for a visit in 2 weeks.   Specialty:  General Surgery   Contact information:   [address] [place] [address] [phone]       Signed: Pedro Earls 12/19/2013, 11:08 AM

## 2013-12-20 DIAGNOSIS — N401 Enlarged prostate with lower urinary tract symptoms: Secondary | ICD-10-CM | POA: Diagnosis not present

## 2013-12-20 DIAGNOSIS — R339 Retention of urine, unspecified: Secondary | ICD-10-CM | POA: Diagnosis not present

## 2013-12-20 DIAGNOSIS — I493 Ventricular premature depolarization: Secondary | ICD-10-CM | POA: Diagnosis not present

## 2013-12-20 DIAGNOSIS — I499 Cardiac arrhythmia, unspecified: Secondary | ICD-10-CM | POA: Diagnosis not present

## 2013-12-26 DIAGNOSIS — E782 Mixed hyperlipidemia: Secondary | ICD-10-CM | POA: Diagnosis not present

## 2013-12-26 DIAGNOSIS — Z1389 Encounter for screening for other disorder: Secondary | ICD-10-CM | POA: Diagnosis not present

## 2013-12-26 DIAGNOSIS — J45909 Unspecified asthma, uncomplicated: Secondary | ICD-10-CM | POA: Diagnosis not present

## 2013-12-26 DIAGNOSIS — Z0000 Encounter for general adult medical examination without abnormal findings: Secondary | ICD-10-CM | POA: Diagnosis not present

## 2013-12-26 DIAGNOSIS — I10 Essential (primary) hypertension: Secondary | ICD-10-CM | POA: Diagnosis not present

## 2013-12-27 DIAGNOSIS — M1612 Unilateral primary osteoarthritis, left hip: Secondary | ICD-10-CM | POA: Diagnosis not present

## 2013-12-27 DIAGNOSIS — M1611 Unilateral primary osteoarthritis, right hip: Secondary | ICD-10-CM | POA: Diagnosis not present

## 2013-12-29 DIAGNOSIS — Z85828 Personal history of other malignant neoplasm of skin: Secondary | ICD-10-CM | POA: Diagnosis not present

## 2013-12-29 DIAGNOSIS — L812 Freckles: Secondary | ICD-10-CM | POA: Diagnosis not present

## 2013-12-29 DIAGNOSIS — L720 Epidermal cyst: Secondary | ICD-10-CM | POA: Diagnosis not present

## 2013-12-29 DIAGNOSIS — D1801 Hemangioma of skin and subcutaneous tissue: Secondary | ICD-10-CM | POA: Diagnosis not present

## 2013-12-29 DIAGNOSIS — L821 Other seborrheic keratosis: Secondary | ICD-10-CM | POA: Diagnosis not present

## 2013-12-29 DIAGNOSIS — L853 Xerosis cutis: Secondary | ICD-10-CM | POA: Diagnosis not present

## 2013-12-29 DIAGNOSIS — L570 Actinic keratosis: Secondary | ICD-10-CM | POA: Diagnosis not present

## 2014-01-02 DIAGNOSIS — J310 Chronic rhinitis: Secondary | ICD-10-CM | POA: Diagnosis not present

## 2014-01-02 DIAGNOSIS — K219 Gastro-esophageal reflux disease without esophagitis: Secondary | ICD-10-CM | POA: Diagnosis not present

## 2014-01-02 DIAGNOSIS — G4733 Obstructive sleep apnea (adult) (pediatric): Secondary | ICD-10-CM | POA: Diagnosis not present

## 2014-01-10 DIAGNOSIS — M1612 Unilateral primary osteoarthritis, left hip: Secondary | ICD-10-CM | POA: Diagnosis not present

## 2014-01-10 DIAGNOSIS — M1712 Unilateral primary osteoarthritis, left knee: Secondary | ICD-10-CM | POA: Diagnosis not present

## 2014-01-10 DIAGNOSIS — M1711 Unilateral primary osteoarthritis, right knee: Secondary | ICD-10-CM | POA: Diagnosis not present

## 2014-01-10 DIAGNOSIS — M1611 Unilateral primary osteoarthritis, right hip: Secondary | ICD-10-CM | POA: Diagnosis not present

## 2014-06-26 DIAGNOSIS — N138 Other obstructive and reflux uropathy: Secondary | ICD-10-CM | POA: Diagnosis not present

## 2014-06-26 DIAGNOSIS — N401 Enlarged prostate with lower urinary tract symptoms: Secondary | ICD-10-CM | POA: Diagnosis not present

## 2014-06-26 DIAGNOSIS — R339 Retention of urine, unspecified: Secondary | ICD-10-CM | POA: Diagnosis not present

## 2014-06-27 DIAGNOSIS — J4520 Mild intermittent asthma, uncomplicated: Secondary | ICD-10-CM | POA: Diagnosis not present

## 2014-06-27 DIAGNOSIS — I10 Essential (primary) hypertension: Secondary | ICD-10-CM | POA: Diagnosis not present

## 2014-06-29 DIAGNOSIS — Z85828 Personal history of other malignant neoplasm of skin: Secondary | ICD-10-CM | POA: Diagnosis not present

## 2014-06-29 DIAGNOSIS — D234 Other benign neoplasm of skin of scalp and neck: Secondary | ICD-10-CM | POA: Diagnosis not present

## 2014-06-29 DIAGNOSIS — D485 Neoplasm of uncertain behavior of skin: Secondary | ICD-10-CM | POA: Diagnosis not present

## 2014-06-29 DIAGNOSIS — D2339 Other benign neoplasm of skin of other parts of face: Secondary | ICD-10-CM | POA: Diagnosis not present

## 2014-06-29 DIAGNOSIS — L812 Freckles: Secondary | ICD-10-CM | POA: Diagnosis not present

## 2014-06-29 DIAGNOSIS — L821 Other seborrheic keratosis: Secondary | ICD-10-CM | POA: Diagnosis not present

## 2014-06-29 DIAGNOSIS — M1712 Unilateral primary osteoarthritis, left knee: Secondary | ICD-10-CM | POA: Diagnosis not present

## 2014-06-29 DIAGNOSIS — L820 Inflamed seborrheic keratosis: Secondary | ICD-10-CM | POA: Diagnosis not present

## 2014-06-29 DIAGNOSIS — L814 Other melanin hyperpigmentation: Secondary | ICD-10-CM | POA: Diagnosis not present

## 2014-06-29 DIAGNOSIS — D1801 Hemangioma of skin and subcutaneous tissue: Secondary | ICD-10-CM | POA: Diagnosis not present

## 2014-06-29 DIAGNOSIS — M1711 Unilateral primary osteoarthritis, right knee: Secondary | ICD-10-CM | POA: Diagnosis not present

## 2014-07-05 DIAGNOSIS — R140 Abdominal distension (gaseous): Secondary | ICD-10-CM | POA: Diagnosis not present

## 2014-07-06 DIAGNOSIS — M170 Bilateral primary osteoarthritis of knee: Secondary | ICD-10-CM | POA: Diagnosis not present

## 2014-07-12 DIAGNOSIS — R202 Paresthesia of skin: Secondary | ICD-10-CM | POA: Diagnosis not present

## 2014-07-12 DIAGNOSIS — R0609 Other forms of dyspnea: Secondary | ICD-10-CM | POA: Diagnosis not present

## 2014-07-12 DIAGNOSIS — R0789 Other chest pain: Secondary | ICD-10-CM | POA: Diagnosis not present

## 2014-07-14 DIAGNOSIS — M1712 Unilateral primary osteoarthritis, left knee: Secondary | ICD-10-CM | POA: Diagnosis not present

## 2014-08-14 ENCOUNTER — Encounter: Payer: Self-pay | Admitting: *Deleted

## 2014-08-16 ENCOUNTER — Ambulatory Visit (INDEPENDENT_AMBULATORY_CARE_PROVIDER_SITE_OTHER): Payer: Medicare Other | Admitting: Interventional Cardiology

## 2014-08-16 ENCOUNTER — Other Ambulatory Visit: Payer: Self-pay | Admitting: Interventional Cardiology

## 2014-08-16 ENCOUNTER — Encounter: Payer: Self-pay | Admitting: Interventional Cardiology

## 2014-08-16 VITALS — BP 100/68 | HR 60 | Ht 68.0 in | Wt 151.8 lb

## 2014-08-16 DIAGNOSIS — G459 Transient cerebral ischemic attack, unspecified: Secondary | ICD-10-CM

## 2014-08-16 DIAGNOSIS — Z01810 Encounter for preprocedural cardiovascular examination: Secondary | ICD-10-CM | POA: Diagnosis not present

## 2014-08-16 DIAGNOSIS — I10 Essential (primary) hypertension: Secondary | ICD-10-CM

## 2014-08-16 DIAGNOSIS — R0789 Other chest pain: Secondary | ICD-10-CM | POA: Diagnosis not present

## 2014-08-16 DIAGNOSIS — I25118 Atherosclerotic heart disease of native coronary artery with other forms of angina pectoris: Secondary | ICD-10-CM | POA: Diagnosis not present

## 2014-08-16 DIAGNOSIS — Z8679 Personal history of other diseases of the circulatory system: Secondary | ICD-10-CM

## 2014-08-16 LAB — CBC WITH DIFFERENTIAL/PLATELET
BASOS ABS: 0 10*3/uL (ref 0.0–0.1)
Basophils Relative: 0.3 % (ref 0.0–3.0)
EOS ABS: 0.1 10*3/uL (ref 0.0–0.7)
Eosinophils Relative: 1.5 % (ref 0.0–5.0)
HCT: 43.8 % (ref 39.0–52.0)
Hemoglobin: 14.5 g/dL (ref 13.0–17.0)
Lymphocytes Relative: 23.6 % (ref 12.0–46.0)
Lymphs Abs: 2.3 10*3/uL (ref 0.7–4.0)
MCHC: 33.1 g/dL (ref 30.0–36.0)
MCV: 90.7 fl (ref 78.0–100.0)
Monocytes Absolute: 0.7 10*3/uL (ref 0.1–1.0)
Monocytes Relative: 6.9 % (ref 3.0–12.0)
Neutro Abs: 6.5 10*3/uL (ref 1.4–7.7)
Neutrophils Relative %: 67.7 % (ref 43.0–77.0)
PLATELETS: 255 10*3/uL (ref 150.0–400.0)
RBC: 4.83 Mil/uL (ref 4.22–5.81)
RDW: 13.7 % (ref 11.5–15.5)
WBC: 9.6 10*3/uL (ref 4.0–10.5)

## 2014-08-16 LAB — PROTIME-INR
INR: 1 ratio (ref 0.8–1.0)
PROTHROMBIN TIME: 11.2 s (ref 9.6–13.1)

## 2014-08-16 LAB — BASIC METABOLIC PANEL
BUN: 22 mg/dL (ref 6–23)
CO2: 28 meq/L (ref 19–32)
Calcium: 9.7 mg/dL (ref 8.4–10.5)
Chloride: 104 mEq/L (ref 96–112)
Creatinine, Ser: 1.05 mg/dL (ref 0.40–1.50)
GFR: 72.26 mL/min (ref >60.00)
Glucose, Bld: 77 mg/dL (ref 70–99)
Potassium: 4.4 mEq/L (ref 3.5–5.1)
SODIUM: 138 meq/L (ref 135–145)

## 2014-08-16 NOTE — Patient Instructions (Signed)
**Note De-Identified  Obfuscation** Medication Instructions:  Same-No change  Labwork: Today (BMET, CBCD and INR)  Testing/Procedures: Your physician has requested that you have a cardiac catheterization. Cardiac catheterization is used to diagnose and/or treat various heart conditions. Doctors may recommend this procedure for a number of different reasons. The most common reason is to evaluate chest pain. Chest pain can be a symptom of coronary artery disease (CAD), and cardiac catheterization can show whether plaque is narrowing or blocking your heart's arteries. This procedure is also used to evaluate the valves, as well as measure the blood flow and oxygen levels in different parts of your heart. For further information please visit [email or web address]. Please follow instruction sheet, as given.   Follow-Up: Your physician recommends that you schedule a follow-up appointment in: will be determined after cath.   Any Other Special Instructions Will Be Listed Below (If Applicable).

## 2014-08-16 NOTE — Progress Notes (Signed)
Patient ID: Alex Oconnor, male   DOB: 1932/03/25, 81 y.o.   MRN: [id number]     Cardiology Office Note   Date:  08/16/2014   ID:  Alex Oconnor, DOB 04/24/1932, MRN [id number]  PCP:  Irven Shelling, MD    Chief Complaint  Patient presents with  . New Evaluation    dyspnea on exeertion     Wt Readings from Last 3 Encounters:  08/16/14 151 lb 12.8 oz (68.856 kg)  12/08/13 157 lb 2 oz (71.271 kg)  11/18/13 161 lb (73.029 kg)       History of Present Illness: Alex Oconnor is a 81 y.o. male  with history of coronary stent 1999. He has had a carotid endarterectomy as well. In 1999, his anginal symptoms were chest discomfort with walking up a hill.  Over the last several weeks, he has noticed discomfort in the center of his chest when he walks up a hill. He does have a history of gastroparesis. He often has abdominal pain from this, but the current discomfort described as different. His discomfort will resolve with rest.  He has not had a heart catheterization since 1999. He did have a stress test several years ago which was normal per his report.  He has not had any discomfort at rest. He has not used any nitroglycerin.  He feels quite fatigued. He is cutting back on his activities due to the discomfort in his chest.    Past Medical History  Diagnosis Date  . Hypertension   . TIA (transient ischemic attack)     11/2011  . Hyperlipemia   . Gastroparesis   . Headache(784.0)     saw in ED 08/2006  . Sleep apnea     10/2001  . Rotator cuff disorder   . Asthma   . Coronary artery disease   . GERD (gastroesophageal reflux disease)   . Irritable bowel syndrome     Past Surgical History  Procedure Laterality Date  . Carotid endarterectomy      right side 1997  . Cardiac catheterization      May 1999  . Cardiovascular stress test      10/1999;09/2002  . Hernia repair      2001  . Knee arthroscopy      right knee 06/2002;12/2005-left knee  . Abd ultrasound       03/2007-xray of abd;01/2008-ultrasound of abd;01/2009-ultrasound of abd  . Colonscopy      02/2007  . Back surgery      MRI lower back 09/2010;back injected 10/2010;11/2010 spinal lumbar fusion L4-5  . Injection of knees      06/2012 both knees injected;10/2013 both knees injected   . Right hip injected      08/2013  . Eyelid surgery      removal of ingrown eyelashes  . Colonoscopy    . Hiatal hernia repair N/A 12/08/2013    Procedure: LAPAROSCOPIC REPAIR OF HIATAL HERNIA;  Surgeon: Pedro Earls, MD;  Location: [facility];  Service: General;  Laterality: N/A;  . Insertion of mesh N/A 12/08/2013    Procedure: INSERTION OF MESH;  Surgeon: Pedro Earls, MD;  Location: [facility];  Service: General;  Laterality: N/A;     Current Outpatient Prescriptions  Medication Sig Dispense Refill  . albuterol (PROVENTIL HFA;VENTOLIN HFA) 108 (90 BASE) MCG/ACT inhaler Inhale 1 puff into the lungs every 4 (four) hours as needed for wheezing or shortness of breath.    Marland Kitchen aspirin 81 MG  tablet Take 81 mg by mouth daily. Pt was taking daily stopped taking two days ago (11/16/2013)    . benazepril (LOTENSIN) 10 MG tablet Take 10 mg by mouth daily.  10  . cholecalciferol (VITAMIN D) 1000 UNITS tablet Take 1,000 Units by mouth daily.    . fexofenadine (ALLEGRA) 180 MG tablet Take 180 mg by mouth daily as needed for allergies.     . fluticasone (FLONASE) 50 MCG/ACT nasal spray Place 1 spray into both nostrils daily as needed for allergies or rhinitis.    Marland Kitchen LORazepam (ATIVAN) 1 MG tablet Take 0.5 mg by mouth at bedtime.    . naphazoline-glycerin (CLEAR EYES) 0.012-0.2 % SOLN Place 1-2 drops into both eyes every 4 (four) hours as needed for irritation.    Marland Kitchen NITROSTAT 0.4 MG SL tablet Take 0.4 mg by mouth as needed. For chest pain  0  . saw palmetto 500 MG capsule Take 500 mg by mouth daily.    . simvastatin (ZOCOR) 20 MG tablet Take 20 mg by mouth every morning.     . vitamin C (ASCORBIC ACID) 500 MG tablet Take 500 mg  by mouth.     No current facility-administered medications for this visit.    Allergies:   Penicillins and Plavix    Social History:  The patient  reports that he quit smoking about 33 years ago. He has never used smokeless tobacco. He reports that he drinks alcohol. He reports that he does not use illicit drugs.   Family History:  The patient's *family history includes Congestive Heart Failure in his brother.    ROS:  Please see the history of present illness.   Otherwise, review of systems are positive for exertional chest discomfort.   All other systems are reviewed and negative.    PHYSICAL EXAM: VS:  BP 100/68 mmHg  Pulse 60  Ht 5\' 8"  (1.727 m)  Wt 151 lb 12.8 oz (68.856 kg)  BMI 23.09 kg/m2 , BMI Body mass index is 23.09 kg/(m^2). GEN: Well nourished, well developed, in no acute distress HEENT: normal Neck: no JVD, carotid bruits, or masses Cardiac: RRR; no murmurs, rubs, or gallops,no edema , 3+ right radial pulse Respiratory:  clear to auscultation bilaterally, normal work of breathing GI: soft, nontender, nondistended, + BS MS: no deformity or atrophy Skin: warm and dry, no rash Neuro:  Strength and sensation are intact Psych: euthymic mood, full affect   EKG:   The ekg ordered today demonstrates normal sinus rhythm, prolonged PR interval, no ST segment changes   Recent Labs: 12/10/2013: BUN 14; Creatinine, Ser 1.09; Hemoglobin 13.7; Platelets 230; Potassium 4.1; Sodium 138   Lipid Panel    Component Value Date/Time   CHOL 103 12/07/2011 0715   TRIG 147 12/07/2011 0715   HDL 25* 12/07/2011 0715   CHOLHDL 4.1 12/07/2011 0715   VLDL 29 12/07/2011 0715   LDLCALC 49 12/07/2011 0715     Other studies Reviewed: Additional studies/ records that were reviewed today with results demonstrating: Prior echo in 2013 showing normal left ventricular function .   ASSESSMENT AND PLAN:  1. Angina: Coming on with less activity. Now class III. His heart rate precludes  using a beta blocker. We discussed using isosorbide. Since he has not used any sublingual nitroglycerin, he did not want to add isosorbide. We discussed stress test versus cardiac catheterization to evaluate for ischemia. I think a cardiac cath would be more useful since we would not believe a negative stress test  result in this patient with a high pretest probability of coronary artery disease. He is agreeable. Cardiac cath procedure was explained to the patient. All questions were answered. He and his wife are agreeable for him to proceed. 2. PVD: Prior carotid artery disease. Of note, he has had a prior TIA. He was intolerant of clopidogrel in the past. Would have to consider using Brilinta. The TIA history would preclude using Effient. 3. Hypertension: Blood pressure control. Normal renal function.  The patient understands that risks include but are not limited to stroke (1 in 1000), death (1 in 58), kidney failure [usually temporary] (1 in 500), bleeding (1 in 200), allergic reaction [possibly serious] (1 in 200), and agrees to proceed.      Current medicines are reviewed at length with the patient today.  The patient concerns regarding his medicines were addressed.  The following changes have been made:  No change  Labs/ tests ordered today include:  Orders Placed This Encounter  Procedures  . Basic Metabolic Panel (BMET)  . CBC with Differential  . INR/PT  . EKG 12-Lead    Recommend 150 minutes/week of aerobic exercise Low fat, low carb, high fiber diet recommended  Disposition:   FU in with Dr. [place] Callas., MD  08/16/2014 1:11 PM    [facility] [address], [place], [place]  [address] Phone: [phone]; Fax: [phone]

## 2014-08-17 ENCOUNTER — Encounter (HOSPITAL_COMMUNITY): Payer: Self-pay | Admitting: Pharmacy Technician

## 2014-08-21 ENCOUNTER — Encounter: Payer: Self-pay | Admitting: Interventional Cardiology

## 2014-08-22 ENCOUNTER — Encounter: Payer: Self-pay | Admitting: Interventional Cardiology

## 2014-08-23 ENCOUNTER — Encounter (HOSPITAL_COMMUNITY)
Admission: RE | Disposition: A | Discharge status: Home / Self Care | Payer: Self-pay | Source: Ambulatory Visit | Attending: Interventional Cardiology

## 2014-08-23 ENCOUNTER — Encounter (HOSPITAL_COMMUNITY): Payer: Self-pay | Admitting: Interventional Cardiology

## 2014-08-23 ENCOUNTER — Ambulatory Visit (HOSPITAL_COMMUNITY)
Admission: RE | Admit: 2014-08-23 | Discharge: 2014-08-23 | Disposition: A | Discharge status: Home / Self Care | Payer: Medicare Other | Source: Ambulatory Visit | Attending: Interventional Cardiology | Admitting: Interventional Cardiology

## 2014-08-23 DIAGNOSIS — Z955 Presence of coronary angioplasty implant and graft: Secondary | ICD-10-CM | POA: Insufficient documentation

## 2014-08-23 DIAGNOSIS — K3184 Gastroparesis: Secondary | ICD-10-CM | POA: Insufficient documentation

## 2014-08-23 DIAGNOSIS — I10 Essential (primary) hypertension: Secondary | ICD-10-CM | POA: Diagnosis not present

## 2014-08-23 DIAGNOSIS — K219 Gastro-esophageal reflux disease without esophagitis: Secondary | ICD-10-CM | POA: Diagnosis not present

## 2014-08-23 DIAGNOSIS — E785 Hyperlipidemia, unspecified: Secondary | ICD-10-CM | POA: Insufficient documentation

## 2014-08-23 DIAGNOSIS — Z7982 Long term (current) use of aspirin: Secondary | ICD-10-CM | POA: Diagnosis not present

## 2014-08-23 DIAGNOSIS — Z8673 Personal history of transient ischemic attack (TIA), and cerebral infarction without residual deficits: Secondary | ICD-10-CM | POA: Diagnosis not present

## 2014-08-23 DIAGNOSIS — G4730 Sleep apnea, unspecified: Secondary | ICD-10-CM | POA: Insufficient documentation

## 2014-08-23 DIAGNOSIS — J45909 Unspecified asthma, uncomplicated: Secondary | ICD-10-CM | POA: Insufficient documentation

## 2014-08-23 DIAGNOSIS — K589 Irritable bowel syndrome without diarrhea: Secondary | ICD-10-CM | POA: Insufficient documentation

## 2014-08-23 DIAGNOSIS — R0600 Dyspnea, unspecified: Secondary | ICD-10-CM | POA: Insufficient documentation

## 2014-08-23 DIAGNOSIS — Z8679 Personal history of other diseases of the circulatory system: Secondary | ICD-10-CM

## 2014-08-23 DIAGNOSIS — Z87891 Personal history of nicotine dependence: Secondary | ICD-10-CM | POA: Insufficient documentation

## 2014-08-23 DIAGNOSIS — I2510 Atherosclerotic heart disease of native coronary artery without angina pectoris: Secondary | ICD-10-CM | POA: Diagnosis not present

## 2014-08-23 DIAGNOSIS — I25118 Atherosclerotic heart disease of native coronary artery with other forms of angina pectoris: Secondary | ICD-10-CM | POA: Diagnosis not present

## 2014-08-23 DIAGNOSIS — I209 Angina pectoris, unspecified: Secondary | ICD-10-CM

## 2014-08-23 DIAGNOSIS — I739 Peripheral vascular disease, unspecified: Secondary | ICD-10-CM | POA: Diagnosis not present

## 2014-08-23 HISTORY — PX: CARDIAC CATHETERIZATION: SHX172

## 2014-08-23 SURGERY — LEFT HEART CATH AND CORONARY ANGIOGRAPHY
Anesthesia: LOCAL

## 2014-08-23 MED ORDER — SODIUM CHLORIDE 0.9 % IJ SOLN: 3.0000 mL | Freq: Two times a day (BID) | INTRAMUSCULAR | Status: DC

## 2014-08-23 MED ORDER — MIDAZOLAM HCL 2 MG/2ML IJ SOLN
INTRAMUSCULAR | Status: AC
  Filled 2014-08-23: qty 2

## 2014-08-23 MED ORDER — SODIUM CHLORIDE 0.9 % IV SOLN: 250.0000 mL | INTRAVENOUS | Status: DC | PRN

## 2014-08-23 MED ORDER — SODIUM CHLORIDE 0.9 % WEIGHT BASED INFUSION: 1.0000 mL/kg/h | INTRAVENOUS | Status: DC

## 2014-08-23 MED ORDER — HEPARIN (PORCINE) IN NACL 2-0.9 UNIT/ML-% IJ SOLN
INTRAMUSCULAR | Status: AC
  Filled 2014-08-23: qty 1500

## 2014-08-23 MED ORDER — LIDOCAINE HCL (PF) 1 % IJ SOLN
INTRAMUSCULAR | Status: AC
  Filled 2014-08-23: qty 30

## 2014-08-23 MED ORDER — VERAPAMIL HCL 2.5 MG/ML IV SOLN
INTRAVENOUS | Status: DC | PRN
  Administered 2014-08-23: 10:00:00 via INTRA_ARTERIAL

## 2014-08-23 MED ORDER — ACETAMINOPHEN 325 MG PO TABS: 650.0000 mg | ORAL_TABLET | ORAL | Status: DC | PRN

## 2014-08-23 MED ORDER — HEPARIN SODIUM (PORCINE) 1000 UNIT/ML IJ SOLN
INTRAMUSCULAR | Status: AC
Start: 2014-08-23 — End: 2014-08-23
  Filled 2014-08-23: qty 1

## 2014-08-23 MED ORDER — FENTANYL CITRATE (PF) 100 MCG/2ML IJ SOLN
INTRAMUSCULAR | Status: DC | PRN
  Administered 2014-08-23: 50 ug via INTRAVENOUS

## 2014-08-23 MED ORDER — NITROGLYCERIN 1 MG/10 ML FOR IR/CATH LAB
INTRA_ARTERIAL | Status: AC
  Filled 2014-08-23: qty 10

## 2014-08-23 MED ORDER — IOHEXOL 350 MG/ML SOLN
INTRAVENOUS | Status: DC | PRN
  Administered 2014-08-23: 100 mL via INTRAVENOUS
  Administered 2014-08-23: 50 mL via INTRAVENOUS

## 2014-08-23 MED ORDER — SODIUM CHLORIDE 0.9 % IJ SOLN: 3.0000 mL | INTRAMUSCULAR | Status: DC | PRN

## 2014-08-23 MED ORDER — ASPIRIN 81 MG PO CHEW
81.0000 mg | CHEWABLE_TABLET | ORAL | Status: AC
  Administered 2014-08-23: 81 mg via ORAL

## 2014-08-23 MED ORDER — MIDAZOLAM HCL 2 MG/2ML IJ SOLN
INTRAMUSCULAR | Status: DC | PRN
  Administered 2014-08-23: 1 mg via INTRAVENOUS

## 2014-08-23 MED ORDER — SODIUM CHLORIDE 0.9 % WEIGHT BASED INFUSION: 3.0000 mL/kg/h | INTRAVENOUS | Status: DC

## 2014-08-23 MED ORDER — OXYCODONE-ACETAMINOPHEN 5-325 MG PO TABS: 1.0000 | ORAL_TABLET | ORAL | Status: DC | PRN

## 2014-08-23 MED ORDER — LIDOCAINE HCL (PF) 1 % IJ SOLN
INTRAMUSCULAR | Status: DC | PRN
  Administered 2014-08-23: 2 mL

## 2014-08-23 MED ORDER — SODIUM CHLORIDE 0.9 % WEIGHT BASED INFUSION
3.0000 mL/kg/h | INTRAVENOUS | Status: DC
  Administered 2014-08-23: 3 mL/kg/h via INTRAVENOUS

## 2014-08-23 MED ORDER — ONDANSETRON HCL 4 MG/2ML IJ SOLN: 4.0000 mg | Freq: Four times a day (QID) | INTRAMUSCULAR | Status: DC | PRN

## 2014-08-23 MED ORDER — HEPARIN SODIUM (PORCINE) 1000 UNIT/ML IJ SOLN
INTRAMUSCULAR | Status: DC | PRN
  Administered 2014-08-23: 3500 [IU] via INTRAVENOUS

## 2014-08-23 MED ORDER — VERAPAMIL HCL 2.5 MG/ML IV SOLN
INTRAVENOUS | Status: AC
  Filled 2014-08-23: qty 2

## 2014-08-23 MED ORDER — NITROGLYCERIN 1 MG/10 ML FOR IR/CATH LAB
INTRA_ARTERIAL | Status: DC | PRN
  Administered 2014-08-23: 10:00:00

## 2014-08-23 MED ORDER — ASPIRIN 81 MG PO CHEW
CHEWABLE_TABLET | ORAL | Status: AC
  Filled 2014-08-23: qty 1

## 2014-08-23 MED ORDER — FENTANYL CITRATE (PF) 100 MCG/2ML IJ SOLN
INTRAMUSCULAR | Status: AC
  Filled 2014-08-23: qty 2

## 2014-08-23 SURGICAL SUPPLY — 12 items
CATH INFINITI 5 FR 3DRC (CATHETERS) ×2 IMPLANT
CATH INFINITI 5 FR JL3.5 (CATHETERS) ×2 IMPLANT
CATH INFINITI 5FR JL4 (CATHETERS) ×2 IMPLANT
CATH INFINITI JR4 5F (CATHETERS) ×2 IMPLANT
DEVICE RAD COMP TR BAND LRG (VASCULAR PRODUCTS) ×2 IMPLANT
GLIDESHEATH SLEND A-KIT 6F 22G (SHEATH) ×2 IMPLANT
KIT HEART LEFT (KITS) ×2 IMPLANT
PACK CARDIAC CATHETERIZATION (CUSTOM PROCEDURE TRAY) ×2 IMPLANT
TRANSDUCER W/STOPCOCK (MISCELLANEOUS) ×2 IMPLANT
TUBING CIL FLEX 10 FLL-RA (TUBING) ×2 IMPLANT
WIRE HI TORQ VERSACORE-J 145CM (WIRE) ×2 IMPLANT
WIRE SAFE-T 1.5MM-J .035X260CM (WIRE) ×2 IMPLANT

## 2014-08-23 NOTE — Interval H&P Note (Signed)
Cath Lab Visit (complete for each Cath Lab visit)  Clinical Evaluation Leading to the Procedure:   ACS: No.  Non-ACS:    Anginal Classification: CCS III  Anti-ischemic medical therapy: Maximal Therapy (2 or more classes of medications)  Non-Invasive Test Results: No non-invasive testing performed  Prior CABG: No previous CABG      History and Physical Interval Note:  08/23/2014 8:57 AM  Alex Oconnor  has presented today for surgery, with the diagnosis of angina  The various methods of treatment have been discussed with the patient and family. After consideration of risks, benefits and other options for treatment, the patient has consented to  Procedure(s): Left Heart Cath and Coronary Angiography (N/A) as a surgical intervention .  The patient's history has been reviewed, patient examined, no change in status, stable for surgery.  I have reviewed the patient's chart and labs.  Questions were answered to the patient's satisfaction.     Sinclair Grooms

## 2014-08-23 NOTE — H&P (View-Only) (Signed)
Patient ID: Alex Oconnor, male   DOB: 21-May-1932, 81 y.o.   MRN: [id number]     Cardiology Office Note   Date:  08/16/2014   ID:  NAKOTA ELSEN, DOB Apr 03, 1932, MRN [id number]  PCP:  Irven Shelling, MD    Chief Complaint  Patient presents with  . New Evaluation    dyspnea on exeertion     Wt Readings from Last 3 Encounters:  08/16/14 151 lb 12.8 oz (68.856 kg)  12/08/13 157 lb 2 oz (71.271 kg)  11/18/13 161 lb (73.029 kg)       History of Present Illness: Alex Oconnor is a 81 y.o. male  with history of coronary stent 1999. He has had a carotid endarterectomy as well. In 1999, his anginal symptoms were chest discomfort with walking up a hill.  Over the last several weeks, he has noticed discomfort in the center of his chest when he walks up a hill. He does have a history of gastroparesis. He often has abdominal pain from this, but the current discomfort described as different. His discomfort will resolve with rest.  He has not had a heart catheterization since 1999. He did have a stress test several years ago which was normal per his report.  He has not had any discomfort at rest. He has not used any nitroglycerin.  He feels quite fatigued. He is cutting back on his activities due to the discomfort in his chest.    Past Medical History  Diagnosis Date  . Hypertension   . TIA (transient ischemic attack)     11/2011  . Hyperlipemia   . Gastroparesis   . Headache(784.0)     saw in ED 08/2006  . Sleep apnea     10/2001  . Rotator cuff disorder   . Asthma   . Coronary artery disease   . GERD (gastroesophageal reflux disease)   . Irritable bowel syndrome     Past Surgical History  Procedure Laterality Date  . Carotid endarterectomy      right side 1997  . Cardiac catheterization      May 1999  . Cardiovascular stress test      10/1999;09/2002  . Hernia repair      2001  . Knee arthroscopy      right knee 06/2002;12/2005-left knee  . Abd ultrasound       03/2007-xray of abd;01/2008-ultrasound of abd;01/2009-ultrasound of abd  . Colonscopy      02/2007  . Back surgery      MRI lower back 09/2010;back injected 10/2010;11/2010 spinal lumbar fusion L4-5  . Injection of knees      06/2012 both knees injected;10/2013 both knees injected   . Right hip injected      08/2013  . Eyelid surgery      removal of ingrown eyelashes  . Colonoscopy    . Hiatal hernia repair N/A 12/08/2013    Procedure: LAPAROSCOPIC REPAIR OF HIATAL HERNIA;  Surgeon: Pedro Earls, MD;  Location: [facility];  Service: General;  Laterality: N/A;  . Insertion of mesh N/A 12/08/2013    Procedure: INSERTION OF MESH;  Surgeon: Pedro Earls, MD;  Location: [facility];  Service: General;  Laterality: N/A;     Current Outpatient Prescriptions  Medication Sig Dispense Refill  . albuterol (PROVENTIL HFA;VENTOLIN HFA) 108 (90 BASE) MCG/ACT inhaler Inhale 1 puff into the lungs every 4 (four) hours as needed for wheezing or shortness of breath.    Marland Kitchen aspirin 81 MG  tablet Take 81 mg by mouth daily. Pt was taking daily stopped taking two days ago (11/16/2013)    . benazepril (LOTENSIN) 10 MG tablet Take 10 mg by mouth daily.  10  . cholecalciferol (VITAMIN D) 1000 UNITS tablet Take 1,000 Units by mouth daily.    . fexofenadine (ALLEGRA) 180 MG tablet Take 180 mg by mouth daily as needed for allergies.     . fluticasone (FLONASE) 50 MCG/ACT nasal spray Place 1 spray into both nostrils daily as needed for allergies or rhinitis.    Marland Kitchen LORazepam (ATIVAN) 1 MG tablet Take 0.5 mg by mouth at bedtime.    . naphazoline-glycerin (CLEAR EYES) 0.012-0.2 % SOLN Place 1-2 drops into both eyes every 4 (four) hours as needed for irritation.    Marland Kitchen NITROSTAT 0.4 MG SL tablet Take 0.4 mg by mouth as needed. For chest pain  0  . saw palmetto 500 MG capsule Take 500 mg by mouth daily.    . simvastatin (ZOCOR) 20 MG tablet Take 20 mg by mouth every morning.     . vitamin C (ASCORBIC ACID) 500 MG tablet Take 500 mg  by mouth.     No current facility-administered medications for this visit.    Allergies:   Penicillins and Plavix    Social History:  The patient  reports that he quit smoking about 33 years ago. He has never used smokeless tobacco. He reports that he drinks alcohol. He reports that he does not use illicit drugs.   Family History:  The patient's *family history includes Congestive Heart Failure in his brother.    ROS:  Please see the history of present illness.   Otherwise, review of systems are positive for exertional chest discomfort.   All other systems are reviewed and negative.    PHYSICAL EXAM: VS:  BP 100/68 mmHg  Pulse 60  Ht 5\' 8"  (1.727 m)  Wt 151 lb 12.8 oz (68.856 kg)  BMI 23.09 kg/m2 , BMI Body mass index is 23.09 kg/(m^2). GEN: Well nourished, well developed, in no acute distress HEENT: normal Neck: no JVD, carotid bruits, or masses Cardiac: RRR; no murmurs, rubs, or gallops,no edema , 3+ right radial pulse Respiratory:  clear to auscultation bilaterally, normal work of breathing GI: soft, nontender, nondistended, + BS MS: no deformity or atrophy Skin: warm and dry, no rash Neuro:  Strength and sensation are intact Psych: euthymic mood, full affect   EKG:   The ekg ordered today demonstrates normal sinus rhythm, prolonged PR interval, no ST segment changes   Recent Labs: 12/10/2013: BUN 14; Creatinine, Ser 1.09; Hemoglobin 13.7; Platelets 230; Potassium 4.1; Sodium 138   Lipid Panel    Component Value Date/Time   CHOL 103 12/07/2011 0715   TRIG 147 12/07/2011 0715   HDL 25* 12/07/2011 0715   CHOLHDL 4.1 12/07/2011 0715   VLDL 29 12/07/2011 0715   LDLCALC 49 12/07/2011 0715     Other studies Reviewed: Additional studies/ records that were reviewed today with results demonstrating: Prior echo in 2013 showing normal left ventricular function .   ASSESSMENT AND PLAN:  1. Angina: Coming on with less activity. Now class III. His heart rate precludes  using a beta blocker. We discussed using isosorbide. Since he has not used any sublingual nitroglycerin, he did not want to add isosorbide. We discussed stress test versus cardiac catheterization to evaluate for ischemia. I think a cardiac cath would be more useful since we would not believe a negative stress test  result in this patient with a high pretest probability of coronary artery disease. He is agreeable. Cardiac cath procedure was explained to the patient. All questions were answered. He and his wife are agreeable for him to proceed. 2. PVD: Prior carotid artery disease. Of note, he has had a prior TIA. He was intolerant of clopidogrel in the past. Would have to consider using Brilinta. The TIA history would preclude using Effient. 3. Hypertension: Blood pressure control. Normal renal function.  The patient understands that risks include but are not limited to stroke (1 in 1000), death (1 in 66), kidney failure [usually temporary] (1 in 500), bleeding (1 in 200), allergic reaction [possibly serious] (1 in 200), and agrees to proceed.      Current medicines are reviewed at length with the patient today.  The patient concerns regarding his medicines were addressed.  The following changes have been made:  No change  Labs/ tests ordered today include:  Orders Placed This Encounter  Procedures  . Basic Metabolic Panel (BMET)  . CBC with Differential  . INR/PT  . EKG 12-Lead    Recommend 150 minutes/week of aerobic exercise Low fat, low carb, high fiber diet recommended  Disposition:   FU in with Dr. Marienthal Callas., MD  08/16/2014 1:11 PM    [facility] [address], [place], [place]  [address] Phone: [phone]; Fax: [phone]

## 2014-08-23 NOTE — Discharge Instructions (Signed)
Radial Site Care °Refer to this sheet in the next few weeks. These instructions provide you with information on caring for yourself after your procedure. Your caregiver may also give you more specific instructions. Your treatment has been planned according to current medical practices, but problems sometimes occur. Call your caregiver if you have any problems or questions after your procedure. °HOME CARE INSTRUCTIONS °· You may shower the day after the procedure. Remove the bandage (dressing) and gently wash the site with plain soap and water. Gently pat the site dry. °· Do not apply powder or lotion to the site. °· Do not submerge the affected site in water for 3 to 5 days. °· Inspect the site at least twice daily. °· Do not flex or bend the affected arm for 24 hours. °· No lifting over 5 pounds (2.3 kg) for 5 days after your procedure. °· Do not drive home if you are discharged the same day of the procedure. Have someone else drive you. °· You may drive 24 hours after the procedure unless otherwise instructed by your caregiver. °· Do not operate machinery or power tools for 24 hours. °· A responsible adult should be with you for the first 24 hours after you arrive home. °What to expect: °· Any bruising will usually fade within 1 to 2 weeks. °· Blood that collects in the tissue (hematoma) may be painful to the touch. It should usually decrease in size and tenderness within 1 to 2 weeks. °SEEK IMMEDIATE MEDICAL CARE IF: °· You have unusual pain at the radial site. °· You have redness, warmth, swelling, or pain at the radial site. °· You have drainage (other than a small amount of blood on the dressing). °· You have chills. °· You have a fever or persistent symptoms for more than 72 hours. °· You have a fever and your symptoms suddenly get worse. °· Your arm becomes pale, cool, tingly, or numb. °· You have heavy bleeding from the site. Hold pressure on the site. °Document Released: 03/15/2010 Document Revised:  05/05/2011 Document Reviewed: 03/15/2010 °ExitCare® Patient Information ©2015 [employer]. This information is not intended to replace advice given to you by your health care provider. Make sure you discuss any questions you have with your health care provider. ° °

## 2014-08-25 DIAGNOSIS — R0789 Other chest pain: Secondary | ICD-10-CM | POA: Diagnosis not present

## 2014-09-13 DIAGNOSIS — R0989 Other specified symptoms and signs involving the circulatory and respiratory systems: Secondary | ICD-10-CM | POA: Diagnosis not present

## 2014-10-10 DIAGNOSIS — Z23 Encounter for immunization: Secondary | ICD-10-CM | POA: Diagnosis not present

## 2014-10-25 DIAGNOSIS — G4733 Obstructive sleep apnea (adult) (pediatric): Secondary | ICD-10-CM | POA: Diagnosis not present

## 2014-10-25 DIAGNOSIS — Z955 Presence of coronary angioplasty implant and graft: Secondary | ICD-10-CM | POA: Diagnosis not present

## 2014-10-25 DIAGNOSIS — Z79899 Other long term (current) drug therapy: Secondary | ICD-10-CM | POA: Diagnosis not present

## 2014-10-25 DIAGNOSIS — Z09 Encounter for follow-up examination after completed treatment for conditions other than malignant neoplasm: Secondary | ICD-10-CM | POA: Diagnosis not present

## 2014-10-25 DIAGNOSIS — Z7982 Long term (current) use of aspirin: Secondary | ICD-10-CM | POA: Diagnosis not present

## 2014-10-25 DIAGNOSIS — K3184 Gastroparesis: Secondary | ICD-10-CM | POA: Diagnosis not present

## 2014-10-25 DIAGNOSIS — Z87891 Personal history of nicotine dependence: Secondary | ICD-10-CM | POA: Diagnosis not present

## 2014-10-25 DIAGNOSIS — Z8673 Personal history of transient ischemic attack (TIA), and cerebral infarction without residual deficits: Secondary | ICD-10-CM | POA: Diagnosis not present

## 2014-10-25 DIAGNOSIS — R140 Abdominal distension (gaseous): Secondary | ICD-10-CM | POA: Diagnosis not present

## 2014-10-25 DIAGNOSIS — E1143 Type 2 diabetes mellitus with diabetic autonomic (poly)neuropathy: Secondary | ICD-10-CM | POA: Diagnosis not present

## 2014-10-25 DIAGNOSIS — I2510 Atherosclerotic heart disease of native coronary artery without angina pectoris: Secondary | ICD-10-CM | POA: Diagnosis not present

## 2014-11-08 DIAGNOSIS — M1712 Unilateral primary osteoarthritis, left knee: Secondary | ICD-10-CM | POA: Diagnosis not present

## 2014-11-08 DIAGNOSIS — M1711 Unilateral primary osteoarthritis, right knee: Secondary | ICD-10-CM | POA: Diagnosis not present

## 2014-11-08 DIAGNOSIS — M170 Bilateral primary osteoarthritis of knee: Secondary | ICD-10-CM | POA: Diagnosis not present

## 2014-12-08 DIAGNOSIS — M1711 Unilateral primary osteoarthritis, right knee: Secondary | ICD-10-CM | POA: Diagnosis not present

## 2014-12-08 DIAGNOSIS — M7061 Trochanteric bursitis, right hip: Secondary | ICD-10-CM | POA: Diagnosis not present

## 2014-12-12 DIAGNOSIS — G4700 Insomnia, unspecified: Secondary | ICD-10-CM | POA: Diagnosis not present

## 2014-12-12 DIAGNOSIS — I10 Essential (primary) hypertension: Secondary | ICD-10-CM | POA: Diagnosis not present

## 2014-12-12 DIAGNOSIS — J4520 Mild intermittent asthma, uncomplicated: Secondary | ICD-10-CM | POA: Diagnosis not present

## 2014-12-14 DIAGNOSIS — L570 Actinic keratosis: Secondary | ICD-10-CM | POA: Diagnosis not present

## 2014-12-14 DIAGNOSIS — L853 Xerosis cutis: Secondary | ICD-10-CM | POA: Diagnosis not present

## 2014-12-14 DIAGNOSIS — L821 Other seborrheic keratosis: Secondary | ICD-10-CM | POA: Diagnosis not present

## 2014-12-14 DIAGNOSIS — Z85828 Personal history of other malignant neoplasm of skin: Secondary | ICD-10-CM | POA: Diagnosis not present

## 2014-12-14 DIAGNOSIS — D225 Melanocytic nevi of trunk: Secondary | ICD-10-CM | POA: Diagnosis not present

## 2014-12-14 DIAGNOSIS — D1801 Hemangioma of skin and subcutaneous tissue: Secondary | ICD-10-CM | POA: Diagnosis not present

## 2014-12-14 DIAGNOSIS — L812 Freckles: Secondary | ICD-10-CM | POA: Diagnosis not present

## 2014-12-18 DIAGNOSIS — N401 Enlarged prostate with lower urinary tract symptoms: Secondary | ICD-10-CM | POA: Diagnosis not present

## 2014-12-18 DIAGNOSIS — R3914 Feeling of incomplete bladder emptying: Secondary | ICD-10-CM | POA: Diagnosis not present

## 2014-12-18 DIAGNOSIS — R3912 Poor urinary stream: Secondary | ICD-10-CM | POA: Diagnosis not present

## 2015-04-04 DIAGNOSIS — R05 Cough: Secondary | ICD-10-CM | POA: Diagnosis not present

## 2015-04-04 DIAGNOSIS — J45909 Unspecified asthma, uncomplicated: Secondary | ICD-10-CM | POA: Diagnosis not present

## 2015-05-28 DIAGNOSIS — R3914 Feeling of incomplete bladder emptying: Secondary | ICD-10-CM | POA: Diagnosis not present

## 2015-05-28 DIAGNOSIS — Z0000 Encounter for general adult medical examination without abnormal findings: Secondary | ICD-10-CM | POA: Diagnosis not present

## 2015-05-28 DIAGNOSIS — R3912 Poor urinary stream: Secondary | ICD-10-CM | POA: Diagnosis not present

## 2015-05-28 DIAGNOSIS — N401 Enlarged prostate with lower urinary tract symptoms: Secondary | ICD-10-CM | POA: Diagnosis not present

## 2015-05-30 ENCOUNTER — Other Ambulatory Visit: Payer: Self-pay | Admitting: Urology

## 2015-05-31 ENCOUNTER — Other Ambulatory Visit: Payer: Self-pay | Admitting: Urology

## 2015-06-06 DIAGNOSIS — M170 Bilateral primary osteoarthritis of knee: Secondary | ICD-10-CM | POA: Diagnosis not present

## 2015-06-12 DIAGNOSIS — J3089 Other allergic rhinitis: Secondary | ICD-10-CM | POA: Diagnosis not present

## 2015-06-12 DIAGNOSIS — R413 Other amnesia: Secondary | ICD-10-CM | POA: Diagnosis not present

## 2015-06-12 DIAGNOSIS — I10 Essential (primary) hypertension: Secondary | ICD-10-CM | POA: Diagnosis not present

## 2015-06-12 DIAGNOSIS — J4520 Mild intermittent asthma, uncomplicated: Secondary | ICD-10-CM | POA: Diagnosis not present

## 2015-06-12 DIAGNOSIS — K3184 Gastroparesis: Secondary | ICD-10-CM | POA: Diagnosis not present

## 2015-06-13 ENCOUNTER — Encounter (HOSPITAL_COMMUNITY): Payer: Self-pay

## 2015-06-13 ENCOUNTER — Encounter (HOSPITAL_COMMUNITY)
Admission: RE | Admit: 2015-06-13 | Discharge: 2015-06-13 | Disposition: A | Discharge status: Home / Self Care | Payer: Medicare Other | Source: Ambulatory Visit | Attending: Urology | Admitting: Urology

## 2015-06-13 DIAGNOSIS — N401 Enlarged prostate with lower urinary tract symptoms: Secondary | ICD-10-CM | POA: Diagnosis not present

## 2015-06-13 DIAGNOSIS — Z01812 Encounter for preprocedural laboratory examination: Secondary | ICD-10-CM | POA: Diagnosis not present

## 2015-06-13 DIAGNOSIS — N138 Other obstructive and reflux uropathy: Secondary | ICD-10-CM | POA: Diagnosis not present

## 2015-06-13 HISTORY — DX: Unspecified hearing loss, unspecified ear: H91.90

## 2015-06-13 HISTORY — DX: Presence of spectacles and contact lenses: Z97.3

## 2015-06-13 HISTORY — DX: Personal history of other diseases of the digestive system: Z87.19

## 2015-06-13 HISTORY — DX: Cardiac arrhythmia, unspecified: I49.9

## 2015-06-13 HISTORY — DX: Unspecified cataract: H26.9

## 2015-06-13 HISTORY — DX: Adverse effect of unspecified anesthetic, initial encounter: T41.45XA

## 2015-06-13 HISTORY — DX: Other complications of anesthesia, initial encounter: T88.59XA

## 2015-06-13 HISTORY — DX: Anesthesia of skin: R20.0

## 2015-06-13 HISTORY — DX: Unspecified osteoarthritis, unspecified site: M19.90

## 2015-06-13 HISTORY — DX: Hesitancy of micturition: R39.11

## 2015-06-13 LAB — COMPREHENSIVE METABOLIC PANEL
ALT: 18 U/L (ref 17–63)
ANION GAP: 8 (ref 5–15)
AST: 23 U/L (ref 15–41)
Albumin: 4.2 g/dL (ref 3.5–5.0)
Alkaline Phosphatase: 62 U/L (ref 38–126)
BUN: 16 mg/dL (ref 6–20)
CHLORIDE: 110 mmol/L (ref 101–111)
CO2: 25 mmol/L (ref 22–32)
Calcium: 9.5 mg/dL (ref 8.9–10.3)
Creatinine, Ser: 1 mg/dL (ref 0.61–1.24)
Glucose, Bld: 87 mg/dL (ref 65–99)
POTASSIUM: 4.3 mmol/L (ref 3.5–5.1)
Sodium: 143 mmol/L (ref 135–145)
Total Bilirubin: 0.6 mg/dL (ref 0.3–1.2)
Total Protein: 7.3 g/dL (ref 6.5–8.1)

## 2015-06-13 LAB — CBC
HEMATOCRIT: 40 % (ref 39.0–52.0)
HEMOGLOBIN: 13.9 g/dL (ref 13.0–17.0)
MCH: 30.3 pg (ref 26.0–34.0)
MCHC: 34.8 g/dL (ref 30.0–36.0)
MCV: 87.3 fL (ref 78.0–100.0)
Platelets: 220 10*3/uL (ref 150–400)
RBC: 4.58 MIL/uL (ref 4.22–5.81)
RDW: 13 % (ref 11.5–15.5)
WBC: 8.4 10*3/uL (ref 4.0–10.5)

## 2015-06-13 NOTE — Patient Instructions (Signed)
Alex Oconnor  06/13/2015   Your procedure is scheduled on: Tuesday June 19, 2015  Report to [facility] Main  Entrance take [place]  elevators to 3rd floor to  [place] at 5:30 AM.  Call this number if you have problems the morning of surgery [phone]   Remember: ONLY 1 PERSON MAY GO WITH YOU TO SHORT STAY TO GET  READY MORNING OF [place].  Do not eat food or drink liquids :After Midnight.     Take these medicines the morning of surgery with A SIP OF WATER: Fexofenadine (Allegra) if needed; May use Flonase if needed; May use eye drops if needed; May use albuterol inhaler if needed                                You may not have any metal on your body including hair pins and              piercings  Do not wear jewelry,  lotions, powders or colognes, deodorant                          Men may shave face and neck.   Do not bring valuables to the hospital. [place].  Contacts, dentures or bridgework may not be worn into surgery.    _____________________________________________________________________             [facility] - Preparing for Surgery Before surgery, you can play an important role.  Because skin is not sterile, your skin needs to be as free of germs as possible.  You can reduce the number of germs on your skin by washing with CHG (chlorahexidine gluconate) soap before surgery.  CHG is an antiseptic cleaner which kills germs and bonds with the skin to continue killing germs even after washing. Please DO NOT use if you have an allergy to CHG or antibacterial soaps.  If your skin becomes reddened/irritated stop using the CHG and inform your nurse when you arrive at Short Stay. Do not shave (including legs and underarms) for at least 48 hours prior to the first CHG shower.  You may shave your face/neck. Please follow these instructions carefully:  1.  Shower with CHG Soap the night  before surgery and the  morning of Surgery.  2.  If you choose to wash your hair, wash your hair first as usual with your  normal  shampoo.  3.  After you shampoo, rinse your hair and body thoroughly to remove the  shampoo.                           4.  Use CHG as you would any other liquid soap.  You can apply chg directly  to the skin and wash                       Gently with a scrungie or clean washcloth.  5.  Apply the CHG Soap to your body ONLY FROM THE NECK DOWN.   Do not use on face/ open  Wound or open sores. Avoid contact with eyes, ears mouth and genitals (private parts).                       Wash face,  Genitals (private parts) with your normal soap.             6.  Wash thoroughly, paying special attention to the area where your surgery  will be performed.  7.  Thoroughly rinse your body with warm water from the neck down.  8.  DO NOT shower/wash with your normal soap after using and rinsing off  the CHG Soap.                9.  Pat yourself dry with a clean towel.            10.  Wear clean pajamas.            11.  Place clean sheets on your bed the night of your first shower and do not  sleep with pets. Day of Surgery : Do not apply any lotions/deodorants the morning of surgery.  Please wear clean clothes to the hospital/surgery center.  FAILURE TO FOLLOW THESE INSTRUCTIONS MAY RESULT IN THE CANCELLATION OF YOUR SURGERY PATIENT SIGNATURE_________________________________  NURSE SIGNATURE__________________________________  ________________________________________________________________________

## 2015-06-14 DIAGNOSIS — M170 Bilateral primary osteoarthritis of knee: Secondary | ICD-10-CM | POA: Diagnosis not present

## 2015-06-18 NOTE — H&P (Signed)
Chief Complaint I don't empty my bladder.   Active Problems Problems  1. Benign prostatic hypertrophy (BPH) with incomplete bladder emptying (N40.1,R39.14) 2. Weak urinary stream (R39.12)  History of Present Illness Alex Oconnor returns today in f/u for his history of BPH with BOO.  He has a weak stream and doesn't empty well. He does have much nocturia but he has significant am intermittency. He has no hematuria or dysuria.   Past Medical History Problems  1. History of arthritis (Z87.39) 2. History of asthma (Z87.09) 3. History of esophageal reflux (Z87.19) 4. History of hyperlipidemia (Z86.39) 5. History of hypertension (Z86.79) 6. History of sleep apnea (Z86.69) 7. History of stroke [id number])  Surgical History Problems  1. History of Carotid Artery Exploration 2. History of Cath Stent Placement 3. History of Esophagogastric Fundoplasty Laparoscopic 4. History of Inguinal Hernia Repair 5. History of Lumbar Vertebral Fusion 6. History of Tonsillectomy With Adenoidectomy  Current Meds 1. Allergy Medication TABS;  Therapy: (Recorded:02May2016) to Recorded 2. Aspirin 81 MG TABS;  Therapy: (Recorded:27Oct2015) to Recorded 3. Benazepril-Hydrochlorothiazide 20-25 MG Oral Tablet;  Therapy: (Recorded:27Oct2015) to Recorded 4. Co Q10 CAPS;  Therapy: (Recorded:02May2016) to Recorded 5. Digestive Enzymes TABS;  Therapy: (Recorded:02May2016) to Recorded 6. Flonase 50 MCG/DOSE INHA;  Therapy: (Recorded:02May2016) to Recorded 7. LORazepam 1 MG Oral Tablet;  Therapy: (Recorded:27Oct2015) to Recorded 8. Probiotic Oral Capsule;  Therapy: (Recorded:27Oct2015) to Recorded 9. Saw Palmetto Plus CAPS;  Therapy: (Recorded:02May2016) to Recorded 10. Simvastatin 20 MG Oral Tablet;   Therapy: (Recorded:27Oct2015) to Recorded 11. Vitamin D-3 TABS;   Therapy: (Recorded:27Oct2015) to Recorded  Allergies Medication  1. Penicillins 2. Plavix TABS  Family History Problems  1. Family  history of Deceased : Mother, Father 2. Family history of cardiac disorder (Z82.49) : Brother  Social History Problems  1. Caffeine use (F15.90) 2. Former smoker [phone]) 3. Married 4. No alcohol use 5. Number of children 6. Retired  Review of Systems  Genitourinary: weak urinary stream, urinary stream starts and stops and incomplete emptying of bladder.  Gastrointestinal: no diarrhea and no constipation.  Constitutional: no fever.  Cardiovascular: no chest pain.  Respiratory: shortness of breath (with the pollen. ).    Vitals Vital Signs [Data Includes: Last 1 Day]  Recorded: 03Apr2017 04:00PM  Blood Pressure: 137 / 88 Temperature: 98.3 F Heart Rate: 63  Physical Exam Constitutional: Well nourished and well developed . No acute distress.  Pulmonary: No respiratory distress and normal respiratory rhythm and effort.  Cardiovascular: Heart rate and rhythm are normal . No peripheral edema.    Results/Data Urine [Data Includes: Last 1 Day]   03Apr2017  COLOR YELLOW   APPEARANCE CLEAR   SPECIFIC GRAVITY 1.020   pH 6.0   GLUCOSE NEGATIVE   BILIRUBIN NEGATIVE   KETONE NEGATIVE   BLOOD NEGATIVE   PROTEIN NEGATIVE   NITRITE NEGATIVE   LEUKOCYTE ESTERASE NEGATIVE    Flow Rate: Voided 262 ml. A peak flow rate of 66ml/s and mean flow rate of 60ml/s.  PVR: Ultrasound PVR 347 ml.    Procedure  Procedure: Cystoscopy   Indication: Lower Urinary Tract Symptoms.  Informed Consent: Risks, benefits, and potential adverse events were discussed and informed consent was obtained from the patient.  Prep: The patient was prepped with betadine.  Antibiotic prophylaxis: Ciprofloxacin.  Procedure Note:  Urethral meatus:. No abnormalities.  Anterior urethra: No abnormalities.  Prostatic urethra: No abnormalities . Estimated length was 4 cm. There was visual obstruction of the prostatic urethra. The lateral prostatic lobes  were enlarged. (with intravesical extension without a middle  lobe. ).  Bladder: Visulization was clear. The ureteral orifices were in the normal anatomic position bilaterally and had clear efflux of urine. A systematic survey of the bladder demonstrated no bladder tumors or stones. The mucosa was smooth without abnormalities. Examination of the bladder demonstrated moderate trabeculation. The patient tolerated the procedure well.  Complications: None.    Assessment Assessed  1. Benign prostatic hypertrophy (BPH) with incomplete bladder emptying (N40.1,R39.14) 2. Weak urinary stream (R39.12)  He has BPH with BOO with a very slow stream and a progressively rising PVR. He has intravesical extension without a middle lobe.   Plan Benign prostatic hypertrophy (BPH) with incomplete bladder emptying  1. Follow-up Schedule Surgery Office  Follow-up  Status: Complete  Done: 03Apr2017 Health Maintenance  2. UA With REFLEX; [Do Not Release]; Status:Resulted - Requires Verification;   DoneSY:[id number] 04:45PM  I discussed the options for therapy including Urolift, Greenlight and TURP.  I am reluctant to do the Urolift because of the intravesical extension. He would prefer to avoid a hospital stay so I will get him set up for a Greenlight procedure and reviewed teh risks of bleeding, infection, injury to adjacent structures, prolonged irritative symptoms, need for secondary procedures, sexual dysfunction, thrombotic events and anesthetic risks.   We will get him set up in the near future.

## 2015-06-19 ENCOUNTER — Ambulatory Visit (HOSPITAL_COMMUNITY)
Admission: RE | Admit: 2015-06-19 | Discharge: 2015-06-19 | Disposition: A | Discharge status: Home / Self Care | Payer: Medicare Other | Source: Ambulatory Visit | Attending: Urology | Admitting: Urology

## 2015-06-19 ENCOUNTER — Encounter (HOSPITAL_COMMUNITY)
Admission: RE | Disposition: A | Discharge status: Home / Self Care | Payer: Self-pay | Source: Ambulatory Visit | Attending: Urology

## 2015-06-19 ENCOUNTER — Ambulatory Visit (HOSPITAL_COMMUNITY): Payer: Medicare Other | Admitting: Certified Registered Nurse Anesthetist

## 2015-06-19 ENCOUNTER — Encounter (HOSPITAL_COMMUNITY): Payer: Self-pay | Admitting: *Deleted

## 2015-06-19 DIAGNOSIS — G4730 Sleep apnea, unspecified: Secondary | ICD-10-CM | POA: Insufficient documentation

## 2015-06-19 DIAGNOSIS — I2510 Atherosclerotic heart disease of native coronary artery without angina pectoris: Secondary | ICD-10-CM | POA: Diagnosis not present

## 2015-06-19 DIAGNOSIS — N401 Enlarged prostate with lower urinary tract symptoms: Secondary | ICD-10-CM | POA: Insufficient documentation

## 2015-06-19 DIAGNOSIS — K449 Diaphragmatic hernia without obstruction or gangrene: Secondary | ICD-10-CM | POA: Diagnosis not present

## 2015-06-19 DIAGNOSIS — Z7982 Long term (current) use of aspirin: Secondary | ICD-10-CM | POA: Diagnosis not present

## 2015-06-19 DIAGNOSIS — R3914 Feeling of incomplete bladder emptying: Secondary | ICD-10-CM | POA: Insufficient documentation

## 2015-06-19 DIAGNOSIS — M1990 Unspecified osteoarthritis, unspecified site: Secondary | ICD-10-CM | POA: Diagnosis not present

## 2015-06-19 DIAGNOSIS — Z8673 Personal history of transient ischemic attack (TIA), and cerebral infarction without residual deficits: Secondary | ICD-10-CM | POA: Insufficient documentation

## 2015-06-19 DIAGNOSIS — E785 Hyperlipidemia, unspecified: Secondary | ICD-10-CM | POA: Diagnosis not present

## 2015-06-19 DIAGNOSIS — Z7951 Long term (current) use of inhaled steroids: Secondary | ICD-10-CM | POA: Diagnosis not present

## 2015-06-19 DIAGNOSIS — R351 Nocturia: Secondary | ICD-10-CM | POA: Diagnosis not present

## 2015-06-19 DIAGNOSIS — I10 Essential (primary) hypertension: Secondary | ICD-10-CM | POA: Diagnosis not present

## 2015-06-19 DIAGNOSIS — Z79899 Other long term (current) drug therapy: Secondary | ICD-10-CM | POA: Diagnosis not present

## 2015-06-19 DIAGNOSIS — R3912 Poor urinary stream: Secondary | ICD-10-CM | POA: Insufficient documentation

## 2015-06-19 DIAGNOSIS — N138 Other obstructive and reflux uropathy: Secondary | ICD-10-CM | POA: Insufficient documentation

## 2015-06-19 DIAGNOSIS — N320 Bladder-neck obstruction: Secondary | ICD-10-CM | POA: Diagnosis not present

## 2015-06-19 DIAGNOSIS — N400 Enlarged prostate without lower urinary tract symptoms: Secondary | ICD-10-CM | POA: Diagnosis not present

## 2015-06-19 DIAGNOSIS — Z87891 Personal history of nicotine dependence: Secondary | ICD-10-CM | POA: Insufficient documentation

## 2015-06-19 HISTORY — PX: GREEN LIGHT LASER TURP (TRANSURETHRAL RESECTION OF PROSTATE: SHX6260

## 2015-06-19 SURGERY — GREEN LIGHT LASER TURP (TRANSURETHRAL RESECTION OF PROSTATE
Anesthesia: General

## 2015-06-19 MED ORDER — OXYCODONE HCL 5 MG PO TABS: 5.0000 mg | ORAL_TABLET | ORAL | Status: DC | PRN

## 2015-06-19 MED ORDER — CIPROFLOXACIN IN D5W 400 MG/200ML IV SOLN
INTRAVENOUS | Status: DC | PRN
  Administered 2015-06-19: 400 mg via INTRAVENOUS

## 2015-06-19 MED ORDER — LIDOCAINE HCL (CARDIAC) 20 MG/ML IV SOLN
INTRAVENOUS | Status: DC | PRN
  Administered 2015-06-19: 40 mg via INTRAVENOUS

## 2015-06-19 MED ORDER — LACTATED RINGERS IV SOLN
INTRAVENOUS | Status: DC | PRN
  Administered 2015-06-19: 07:00:00 via INTRAVENOUS

## 2015-06-19 MED ORDER — ONDANSETRON HCL 4 MG/2ML IJ SOLN
INTRAMUSCULAR | Status: DC | PRN
  Administered 2015-06-19: 4 mg via INTRAVENOUS

## 2015-06-19 MED ORDER — PROPOFOL 10 MG/ML IV BOLUS
INTRAVENOUS | Status: DC | PRN
  Administered 2015-06-19: 50 mg via INTRAVENOUS
  Administered 2015-06-19: 100 mg via INTRAVENOUS

## 2015-06-19 MED ORDER — MIDAZOLAM HCL 2 MG/2ML IJ SOLN
INTRAMUSCULAR | Status: AC
  Filled 2015-06-19: qty 2

## 2015-06-19 MED ORDER — ACETAMINOPHEN 325 MG PO TABS
650.0000 mg | ORAL_TABLET | ORAL | Status: DC | PRN
Start: 2015-06-19 — End: 2015-06-19

## 2015-06-19 MED ORDER — ACETAMINOPHEN 650 MG RE SUPP
650.0000 mg | RECTAL | Status: DC | PRN
  Filled 2015-06-19: qty 1

## 2015-06-19 MED ORDER — FENTANYL CITRATE (PF) 250 MCG/5ML IJ SOLN
INTRAMUSCULAR | Status: DC | PRN
  Administered 2015-06-19 (×2): 25 ug via INTRAVENOUS
  Administered 2015-06-19: 50 ug via INTRAVENOUS

## 2015-06-19 MED ORDER — TRAMADOL-ACETAMINOPHEN 37.5-325 MG PO TABS: 1.0000 | ORAL_TABLET | Freq: Four times a day (QID) | ORAL | Status: DC | PRN

## 2015-06-19 MED ORDER — FENTANYL CITRATE (PF) 100 MCG/2ML IJ SOLN: 25.0000 ug | INTRAMUSCULAR | Status: DC | PRN

## 2015-06-19 MED ORDER — MIDAZOLAM HCL 2 MG/2ML IJ SOLN
INTRAMUSCULAR | Status: DC | PRN
  Administered 2015-06-19: .5 mg via INTRAVENOUS

## 2015-06-19 MED ORDER — HYDROMORPHONE HCL 1 MG/ML IJ SOLN: 0.2500 mg | INTRAMUSCULAR | Status: DC | PRN

## 2015-06-19 MED ORDER — SODIUM CHLORIDE 0.9% FLUSH: 3.0000 mL | Freq: Two times a day (BID) | INTRAVENOUS | Status: DC

## 2015-06-19 MED ORDER — ISOPROPYL ALCOHOL 70 % SOLN
Status: AC
  Filled 2015-06-19: qty 480

## 2015-06-19 MED ORDER — PROPOFOL 10 MG/ML IV BOLUS
INTRAVENOUS | Status: AC
  Filled 2015-06-19: qty 20

## 2015-06-19 MED ORDER — FENTANYL CITRATE (PF) 250 MCG/5ML IJ SOLN
INTRAMUSCULAR | Status: AC
  Filled 2015-06-19: qty 5

## 2015-06-19 MED ORDER — CIPROFLOXACIN IN D5W 200 MG/100ML IV SOLN
200.0000 mg | Freq: Two times a day (BID) | INTRAVENOUS | Status: DC
  Filled 2015-06-19: qty 100

## 2015-06-19 MED ORDER — EPHEDRINE SULFATE 50 MG/ML IJ SOLN
INTRAMUSCULAR | Status: DC | PRN
  Administered 2015-06-19: 10 mg via INTRAVENOUS
  Administered 2015-06-19 (×3): 5 mg via INTRAVENOUS

## 2015-06-19 MED ORDER — SODIUM CHLORIDE 0.9 % IR SOLN
Status: DC | PRN
  Administered 2015-06-19: 6000 mL

## 2015-06-19 MED ORDER — SODIUM CHLORIDE 0.9 % IV SOLN: 250.0000 mL | INTRAVENOUS | Status: DC | PRN

## 2015-06-19 MED ORDER — 0.9 % SODIUM CHLORIDE (POUR BTL) OPTIME
TOPICAL | Status: DC | PRN
  Administered 2015-06-19: 1000 mL

## 2015-06-19 MED ORDER — SODIUM CHLORIDE 0.9% FLUSH: 3.0000 mL | INTRAVENOUS | Status: DC | PRN

## 2015-06-19 MED ORDER — CIPROFLOXACIN IN D5W 400 MG/200ML IV SOLN
INTRAVENOUS | Status: AC
  Filled 2015-06-19: qty 200

## 2015-06-19 SURGICAL SUPPLY — 17 items
BAG URINE DRAINAGE (UROLOGICAL SUPPLIES) ×3 IMPLANT
BAG URO CATCHER STRL LF (MISCELLANEOUS) ×3 IMPLANT
CATH FOLEY 2WAY SLVR 30CC 20FR (CATHETERS) ×3 IMPLANT
CATH FOLEY 2WAY SLVR 30CC 22FR (CATHETERS) IMPLANT
CATH URET 5FR 28IN OPEN ENDED (CATHETERS) IMPLANT
GLOVE BIOGEL M STRL SZ7.5 (GLOVE) ×3 IMPLANT
GLOVE SURG SS PI 8.0 STRL IVOR (GLOVE) IMPLANT
GOWN STRL REUS W/TWL XL LVL3 (GOWN DISPOSABLE) ×3 IMPLANT
HOLDER FOLEY CATH W/STRAP (MISCELLANEOUS) ×3 IMPLANT
LASER FIBER /GREENLIGHT LASER (Laser) ×3 IMPLANT
LASER GREENLIGHT RENTAL P/PROC (Laser) ×3 IMPLANT
PACK CYSTO (CUSTOM PROCEDURE TRAY) ×3 IMPLANT
SYR 30ML LL (SYRINGE) ×3 IMPLANT
SYRINGE IRR TOOMEY STRL 70CC (SYRINGE) ×3 IMPLANT
TUBING CONNECTING 10 (TUBING) ×2 IMPLANT
TUBING CONNECTING 10' (TUBING) ×1
WATER STERILE IRR 500ML POUR (IV SOLUTION) ×3 IMPLANT

## 2015-06-19 NOTE — Anesthesia Preprocedure Evaluation (Addendum)
Anesthesia Evaluation  Patient identified by MRN, date of birth, ID band Patient awake    Reviewed: Allergy & Precautions, NPO status , Patient's Chart, lab work & pertinent test results  History of Anesthesia Complications (+) history of anesthetic complications  Airway Mallampati: II  TM Distance: >3 FB Neck ROM: Full    Dental no notable dental hx. (+) Dental Advisory Given   Pulmonary asthma , sleep apnea , former smoker,    Pulmonary exam normal breath sounds clear to auscultation       Cardiovascular hypertension, Pt. on medications + angina + CAD  Normal cardiovascular exam+ dysrhythmias  Rhythm:Regular Rate:Normal  ECHO 12-07-11: Study Conclusions  Left ventricle: The cavity size was normal. Systolic function was normal. The estimated ejection fraction was in the range of 60% to 65%. Wall motion was normal; there were no regional wall motion abnormalities. Doppler parameters are consistent with abnormal left ventricular relaxation (grade 1 diastolic dysfunction).   Neuro/Psych  Headaches, TIA Neuromuscular disease negative psych ROS   GI/Hepatic Neg liver ROS, hiatal hernia, GERD  ,  Endo/Other  negative endocrine ROS  Renal/GU negative Renal ROS  negative genitourinary   Musculoskeletal  (+) Arthritis ,   Abdominal   Peds negative pediatric ROS (+)  Hematology negative hematology ROS (+)   Anesthesia Other Findings   Reproductive/Obstetrics negative OB ROS                           Anesthesia Physical Anesthesia Plan  ASA: III  Anesthesia Plan: General   Post-op Pain Management:    Induction: Intravenous  Airway Management Planned: LMA  Additional Equipment:   Intra-op Plan:   Post-operative Plan: Extubation in OR  Informed Consent: I have reviewed the patients History and Physical, chart, labs and discussed the procedure including the risks, benefits and  alternatives for the proposed anesthesia with the patient or authorized representative who has indicated his/her understanding and acceptance.   Dental advisory given  Plan Discussed with: CRNA  Anesthesia Plan Comments:         Anesthesia Quick Evaluation

## 2015-06-19 NOTE — Anesthesia Procedure Notes (Signed)
Procedure Name: LMA Insertion Date/Time: 06/19/2015 7:33 AM Performed by: Cynda Familia Pre-anesthesia Checklist: Patient identified, Emergency Drugs available, Suction available and Patient being monitored Patient Re-evaluated:Patient Re-evaluated prior to inductionOxygen Delivery Method: Circle System Utilized Preoxygenation: Pre-oxygenation with 100% oxygen Intubation Type: IV induction Ventilation: Mask ventilation without difficulty LMA: LMA inserted LMA Size: 4.0 Tube type: Oral Number of attempts: 1 Placement Confirmation: positive ETCO2 Tube secured with: Tape Dental Injury: Teeth and Oropharynx as per pre-operative assessment  Comments: Smooth IV induction-- LMA AM atraumatic--- teeth and mouth as preop

## 2015-06-19 NOTE — Brief Op Note (Signed)
06/19/2015  8:14 AM  PATIENT:  Alex Oconnor  82 y.o. male  PRE-OPERATIVE DIAGNOSIS:  BPH WITH BLADDER OUTLET OBSTRUCTION  POST-OPERATIVE DIAGNOSIS:  BPH WITH BLADDER OUTLET OBSTRUCTION  PROCEDURE:  Procedure(s): GREEN LIGHT LASER TURP (TRANSURETHRAL RESECTION OF PROSTATE (N/A)  SURGEON:  Surgeon(s) and Role:    * Irine Seal, MD - Primary  PHYSICIAN ASSISTANT:   ASSISTANTS: none   ANESTHESIA:   general  EBL:     BLOOD ADMINISTERED:none  DRAINS: Urinary Catheter (Foley)   LOCAL MEDICATIONS USED:  NONE  SPECIMEN:  No Specimen  DISPOSITION OF SPECIMEN:  N/A  COUNTS:  YES  TOURNIQUET:  * No tourniquets in log *  DICTATION: .Other Dictation: Dictation Number [id number]  PLAN OF CARE: Discharge to home after PACU  PATIENT DISPOSITION:  PACU - hemodynamically stable.   Delay start of Pharmacological VTE agent (>24hrs) due to surgical blood loss or risk of bleeding: yes

## 2015-06-19 NOTE — Interval H&P Note (Signed)
History and Physical Interval Note:  06/19/2015 7:12 AM  Alex Oconnor  has presented today for surgery, with the diagnosis of BPH WITH BLADDER OUTLET OBSTRUCTION  The various methods of treatment have been discussed with the patient and family. After consideration of risks, benefits and other options for treatment, the patient has consented to  Procedure(s): GREEN LIGHT LASER TURP (TRANSURETHRAL RESECTION OF PROSTATE (N/A) as a surgical intervention .  The patient's history has been reviewed, patient examined, no change in status, stable for surgery.  I have reviewed the patient's chart and labs.  Questions were answered to the patient's satisfaction.     Gurleen Larrivee J

## 2015-06-19 NOTE — Anesthesia Postprocedure Evaluation (Signed)
Anesthesia Post Note  Patient: Alex Oconnor  Procedure(s) Performed: Procedure(s) (LRB): GREEN LIGHT LASER TURP (TRANSURETHRAL RESECTION OF PROSTATE (N/A)  Patient location during evaluation: PACU Anesthesia Type: General Level of consciousness: awake and alert Pain management: pain level controlled Vital Signs Assessment: post-procedure vital signs reviewed and stable Respiratory status: spontaneous breathing, nonlabored ventilation, respiratory function stable and patient connected to nasal cannula oxygen Cardiovascular status: blood pressure returned to baseline and stable Postop Assessment: no signs of nausea or vomiting Anesthetic complications: no    Last Vitals:  Filed Vitals:   06/19/15 0915 06/19/15 1007  BP: 121/75 111/58  Pulse: 60 56  Temp: 36.3 C   Resp: 16 16    Last Pain:  Filed Vitals:   06/19/15 1007  PainSc: 0-No pain                 Nadean Montanaro J

## 2015-06-19 NOTE — Discharge Instructions (Signed)
Prostate Laser Surgery, Care After Refer to this sheet in the next few weeks. These instructions provide you with information on caring for yourself after your procedure. Your health care provider may also give you more specific instructions. Your treatment has been planned according to current medical practices, but problems sometimes occur. Call your health care provider if you have any problems or questions after your procedure. WHAT TO EXPECT AFTER THE PROCEDURE  You may notice blood in your urine that could last up to 3 weeks.  After your catheter is removed, you will have burning (especially at the tip of your penis) when you urinate, especially at the end of urination. For the first few weeks after your procedure, you will feel the need to urinate often. HOME CARE INSTRUCTIONS   Do not perform vigorous exercise, especially heavy lifting, for 1 week or as directed by your health care provider.  Avoid sexual activity for 4-6 weeks or as directed by your health care provider.  Do not ride in a car for extended periods for 1 month or as directed by your health care provider.  Do not strain to have a bowel movement. Drink a lot of fluids and and make sure you get enough fiber in your diet.  Drink enough fluids to keep your urine clear or pale yellow. SEEK IMMEDIATE MEDICAL CARE IF:   Your catheter has been removed and you are suddenly unable to urinate.  Your catheter has not been removed, and it develops a blockage.  You start to have blood clots in your urine.  The blood in your urine becomes persistent or gets thick.  Your temperature is greater than 100.32F (38.1C).  You develop chest pains.  You develop shortness of breath.  You develop leg swelling or pain. MAKE SURE YOU:  Understand these instructions.  Will watch your condition.  Will get help right away if you are not doing well or get worse.   This information is not intended to replace advice given to you by  your health care provider. Make sure you discuss any questions you have with your health care provider.   Document Released: 02/10/2005 Document Revised: 02/15/2013 Document Reviewed: 08/02/2012 Elsevier Interactive Patient Education 2016 [place] may remove the catheter in the morning by cutting the side arm and allowing the balloon to drain.   The catheter should just slide out.   If you don't feel comfortable doing that, you can come to the office for the removal.

## 2015-06-19 NOTE — Transfer of Care (Signed)
Immediate Anesthesia Transfer of Care Note  Patient: Alex Oconnor  Procedure(s) Performed: Procedure(s): GREEN LIGHT LASER TURP (TRANSURETHRAL RESECTION OF PROSTATE (N/A)  Patient Location: PACU  Anesthesia Type:General  Level of Consciousness: sedated  Airway & Oxygen Therapy: Patient Spontanous Breathing and Patient connected to face mask oxygen  Post-op Assessment: Report given to RN and Post -op Vital signs reviewed and stable  Post vital signs: Reviewed and stable  Last Vitals:  Filed Vitals:   06/19/15 0530  BP: 136/85  Pulse: 59  Temp: 36.6 C  Resp: 16    Complications: No apparent anesthesia complications

## 2015-06-19 NOTE — Op Note (Signed)
NAMENOXX, REECK               ACCOUNT NO.:  [id number]  MEDICAL RECORD NO.:  [id number]  LOCATION:  [facility]                         FACILITY:  [facility]  PHYSICIAN:  Marshall Cork. Jeffie Pollock, M.D.    DATE OF BIRTH:  Jul 26, 1932  DATE OF PROCEDURE:  06/19/2015 DATE OF DISCHARGE:                              OPERATIVE REPORT   PROCEDURE:  GreenLight laser resection of the prostate.  PREOPERATIVE DIAGNOSIS:  Benign prostatic hypertrophy with bladder outlet obstruction.  POSTOPERATIVE DIAGNOSIS:  Benign prostatic hypertrophy with bladder outlet obstruction.  SURGEON:  Marshall Cork. Jeffie Pollock, M.D.  ANESTHESIA:  General.  SPECIMEN:  None.  DRAINS:  A 20-French Foley catheter.  BLOOD LOSS:  None.  COMPLICATIONS:  None.  INDICATIONS:  Mr. Maughan is an 82 year old white male with BPH and bladder outlet obstruction who has elected GreenLight laser therapy for treatment.  FINDINGS OF PROCEDURE:  He was taken to the operating room where general anesthetic was induced.  He was given Cipro.  He was placed in lithotomy position and fitted with PAS hose.  His perineum and genitalia were prepped with Betadine solution and he was draped in usual sterile fashion.  After initial attempt to pass the laser scope, his urethral meatus was calibrated to 26-French with Leander Rams sounds.  The scope was then easily passed.  Inspection revealed a normal urethra.  The external sphincter was intact.  The prostatic urethra was approximately 3-4 cm in length.  There was lateral lobe hyperplasia.  There was actually a nodular lobe on the floor of the prostate as well more to the left.  He had no significant middle lobe.  Inspection of bladder revealed mild-to-moderate trabeculation.  No tumors or stones were noted.  Ureteral orifices were unremarkable.  After initial inspection, the laser fiber was prepped and inserted and set on 80 watts.  The bladder neck was then resected at 80 watts.  The power was increased to 120  watts and the right and left lateral lobes and floor of the prostate were then vaporized to create an adequate channel.  The vaporization was carried from the bladder neck out to the veru. Once the floor and lateral lobes have been vaporized, some residual anterior tissue was vaporized.  Once an adequate channel was created and hemostasis was assured, the scope was removed.  Pressure on the bladder produced an excellent stream.  A 20-French Foley catheter was inserted.  The balloon was filled with 30 mL of sterile fluid.  The catheter was irrigated with clear return and placed to straight drainage.  The patient was taken down from lithotomy position.  His anesthetic was reversed.  He was moved to recovery in stable condition.  There were no complications. The laser power delivery statistics will be in operative record.     Marshall Cork. Jeffie Pollock, M.D.     JJW/MEDQ  D:  06/19/2015  T:  06/19/2015  Job:  [id number]

## 2015-06-19 NOTE — Addendum Note (Signed)
Addendum  created 06/19/15 1105 by Cynda Familia, CRNA   Modules edited: Charges VN

## 2015-06-20 ENCOUNTER — Encounter (HOSPITAL_COMMUNITY): Payer: Self-pay | Admitting: Urology

## 2015-06-22 DIAGNOSIS — M170 Bilateral primary osteoarthritis of knee: Secondary | ICD-10-CM | POA: Diagnosis not present

## 2015-06-26 ENCOUNTER — Encounter (HOSPITAL_COMMUNITY): Payer: Self-pay | Admitting: Urology

## 2015-07-02 DIAGNOSIS — K219 Gastro-esophageal reflux disease without esophagitis: Secondary | ICD-10-CM | POA: Diagnosis not present

## 2015-07-02 DIAGNOSIS — R140 Abdominal distension (gaseous): Secondary | ICD-10-CM | POA: Diagnosis not present

## 2015-07-03 DIAGNOSIS — R3912 Poor urinary stream: Secondary | ICD-10-CM | POA: Diagnosis not present

## 2015-07-03 DIAGNOSIS — N401 Enlarged prostate with lower urinary tract symptoms: Secondary | ICD-10-CM | POA: Diagnosis not present

## 2015-07-03 DIAGNOSIS — Z0000 Encounter for general adult medical examination without abnormal findings: Secondary | ICD-10-CM | POA: Diagnosis not present

## 2015-07-26 DIAGNOSIS — Z09 Encounter for follow-up examination after completed treatment for conditions other than malignant neoplasm: Secondary | ICD-10-CM | POA: Diagnosis not present

## 2015-08-13 DIAGNOSIS — M5032 Other cervical disc degeneration, mid-cervical region, unspecified level: Secondary | ICD-10-CM | POA: Diagnosis not present

## 2015-08-13 DIAGNOSIS — M9901 Segmental and somatic dysfunction of cervical region: Secondary | ICD-10-CM | POA: Diagnosis not present

## 2015-08-13 DIAGNOSIS — M5414 Radiculopathy, thoracic region: Secondary | ICD-10-CM | POA: Diagnosis not present

## 2015-08-13 DIAGNOSIS — M9902 Segmental and somatic dysfunction of thoracic region: Secondary | ICD-10-CM | POA: Diagnosis not present

## 2015-08-15 DIAGNOSIS — M5032 Other cervical disc degeneration, mid-cervical region, unspecified level: Secondary | ICD-10-CM | POA: Diagnosis not present

## 2015-08-15 DIAGNOSIS — M9902 Segmental and somatic dysfunction of thoracic region: Secondary | ICD-10-CM | POA: Diagnosis not present

## 2015-08-15 DIAGNOSIS — M5414 Radiculopathy, thoracic region: Secondary | ICD-10-CM | POA: Diagnosis not present

## 2015-08-15 DIAGNOSIS — M9901 Segmental and somatic dysfunction of cervical region: Secondary | ICD-10-CM | POA: Diagnosis not present

## 2015-08-17 DIAGNOSIS — M9901 Segmental and somatic dysfunction of cervical region: Secondary | ICD-10-CM | POA: Diagnosis not present

## 2015-08-17 DIAGNOSIS — M5414 Radiculopathy, thoracic region: Secondary | ICD-10-CM | POA: Diagnosis not present

## 2015-08-17 DIAGNOSIS — M9902 Segmental and somatic dysfunction of thoracic region: Secondary | ICD-10-CM | POA: Diagnosis not present

## 2015-08-17 DIAGNOSIS — M5032 Other cervical disc degeneration, mid-cervical region, unspecified level: Secondary | ICD-10-CM | POA: Diagnosis not present

## 2015-08-20 DIAGNOSIS — M9902 Segmental and somatic dysfunction of thoracic region: Secondary | ICD-10-CM | POA: Diagnosis not present

## 2015-08-20 DIAGNOSIS — M5414 Radiculopathy, thoracic region: Secondary | ICD-10-CM | POA: Diagnosis not present

## 2015-08-20 DIAGNOSIS — M9901 Segmental and somatic dysfunction of cervical region: Secondary | ICD-10-CM | POA: Diagnosis not present

## 2015-08-20 DIAGNOSIS — M5032 Other cervical disc degeneration, mid-cervical region, unspecified level: Secondary | ICD-10-CM | POA: Diagnosis not present

## 2015-08-22 DIAGNOSIS — M9902 Segmental and somatic dysfunction of thoracic region: Secondary | ICD-10-CM | POA: Diagnosis not present

## 2015-08-22 DIAGNOSIS — M5414 Radiculopathy, thoracic region: Secondary | ICD-10-CM | POA: Diagnosis not present

## 2015-08-22 DIAGNOSIS — M9901 Segmental and somatic dysfunction of cervical region: Secondary | ICD-10-CM | POA: Diagnosis not present

## 2015-08-22 DIAGNOSIS — M5032 Other cervical disc degeneration, mid-cervical region, unspecified level: Secondary | ICD-10-CM | POA: Diagnosis not present

## 2015-08-24 DIAGNOSIS — M5414 Radiculopathy, thoracic region: Secondary | ICD-10-CM | POA: Diagnosis not present

## 2015-08-24 DIAGNOSIS — M9902 Segmental and somatic dysfunction of thoracic region: Secondary | ICD-10-CM | POA: Diagnosis not present

## 2015-08-24 DIAGNOSIS — M5032 Other cervical disc degeneration, mid-cervical region, unspecified level: Secondary | ICD-10-CM | POA: Diagnosis not present

## 2015-08-24 DIAGNOSIS — M9901 Segmental and somatic dysfunction of cervical region: Secondary | ICD-10-CM | POA: Diagnosis not present

## 2015-08-30 DIAGNOSIS — M9901 Segmental and somatic dysfunction of cervical region: Secondary | ICD-10-CM | POA: Diagnosis not present

## 2015-08-30 DIAGNOSIS — M5414 Radiculopathy, thoracic region: Secondary | ICD-10-CM | POA: Diagnosis not present

## 2015-08-30 DIAGNOSIS — M9902 Segmental and somatic dysfunction of thoracic region: Secondary | ICD-10-CM | POA: Diagnosis not present

## 2015-08-30 DIAGNOSIS — M5032 Other cervical disc degeneration, mid-cervical region, unspecified level: Secondary | ICD-10-CM | POA: Diagnosis not present

## 2015-08-31 DIAGNOSIS — M5414 Radiculopathy, thoracic region: Secondary | ICD-10-CM | POA: Diagnosis not present

## 2015-08-31 DIAGNOSIS — M9901 Segmental and somatic dysfunction of cervical region: Secondary | ICD-10-CM | POA: Diagnosis not present

## 2015-08-31 DIAGNOSIS — M9902 Segmental and somatic dysfunction of thoracic region: Secondary | ICD-10-CM | POA: Diagnosis not present

## 2015-08-31 DIAGNOSIS — M5032 Other cervical disc degeneration, mid-cervical region, unspecified level: Secondary | ICD-10-CM | POA: Diagnosis not present

## 2015-09-03 DIAGNOSIS — M5414 Radiculopathy, thoracic region: Secondary | ICD-10-CM | POA: Diagnosis not present

## 2015-09-03 DIAGNOSIS — M5032 Other cervical disc degeneration, mid-cervical region, unspecified level: Secondary | ICD-10-CM | POA: Diagnosis not present

## 2015-09-03 DIAGNOSIS — M9901 Segmental and somatic dysfunction of cervical region: Secondary | ICD-10-CM | POA: Diagnosis not present

## 2015-09-03 DIAGNOSIS — M9902 Segmental and somatic dysfunction of thoracic region: Secondary | ICD-10-CM | POA: Diagnosis not present

## 2015-09-05 DIAGNOSIS — M5032 Other cervical disc degeneration, mid-cervical region, unspecified level: Secondary | ICD-10-CM | POA: Diagnosis not present

## 2015-09-05 DIAGNOSIS — M9901 Segmental and somatic dysfunction of cervical region: Secondary | ICD-10-CM | POA: Diagnosis not present

## 2015-09-05 DIAGNOSIS — M5414 Radiculopathy, thoracic region: Secondary | ICD-10-CM | POA: Diagnosis not present

## 2015-09-05 DIAGNOSIS — M9902 Segmental and somatic dysfunction of thoracic region: Secondary | ICD-10-CM | POA: Diagnosis not present

## 2015-09-10 DIAGNOSIS — M9902 Segmental and somatic dysfunction of thoracic region: Secondary | ICD-10-CM | POA: Diagnosis not present

## 2015-09-10 DIAGNOSIS — M9901 Segmental and somatic dysfunction of cervical region: Secondary | ICD-10-CM | POA: Diagnosis not present

## 2015-09-10 DIAGNOSIS — M5032 Other cervical disc degeneration, mid-cervical region, unspecified level: Secondary | ICD-10-CM | POA: Diagnosis not present

## 2015-09-10 DIAGNOSIS — M5414 Radiculopathy, thoracic region: Secondary | ICD-10-CM | POA: Diagnosis not present

## 2015-09-14 DIAGNOSIS — M5414 Radiculopathy, thoracic region: Secondary | ICD-10-CM | POA: Diagnosis not present

## 2015-09-14 DIAGNOSIS — M9901 Segmental and somatic dysfunction of cervical region: Secondary | ICD-10-CM | POA: Diagnosis not present

## 2015-09-14 DIAGNOSIS — M5032 Other cervical disc degeneration, mid-cervical region, unspecified level: Secondary | ICD-10-CM | POA: Diagnosis not present

## 2015-09-14 DIAGNOSIS — M9902 Segmental and somatic dysfunction of thoracic region: Secondary | ICD-10-CM | POA: Diagnosis not present

## 2015-09-27 DIAGNOSIS — M9901 Segmental and somatic dysfunction of cervical region: Secondary | ICD-10-CM | POA: Diagnosis not present

## 2015-09-27 DIAGNOSIS — M5414 Radiculopathy, thoracic region: Secondary | ICD-10-CM | POA: Diagnosis not present

## 2015-09-27 DIAGNOSIS — M5032 Other cervical disc degeneration, mid-cervical region, unspecified level: Secondary | ICD-10-CM | POA: Diagnosis not present

## 2015-09-27 DIAGNOSIS — M9902 Segmental and somatic dysfunction of thoracic region: Secondary | ICD-10-CM | POA: Diagnosis not present

## 2015-10-05 DIAGNOSIS — M5032 Other cervical disc degeneration, mid-cervical region, unspecified level: Secondary | ICD-10-CM | POA: Diagnosis not present

## 2015-10-05 DIAGNOSIS — M9901 Segmental and somatic dysfunction of cervical region: Secondary | ICD-10-CM | POA: Diagnosis not present

## 2015-10-05 DIAGNOSIS — M9902 Segmental and somatic dysfunction of thoracic region: Secondary | ICD-10-CM | POA: Diagnosis not present

## 2015-10-05 DIAGNOSIS — M5414 Radiculopathy, thoracic region: Secondary | ICD-10-CM | POA: Diagnosis not present

## 2015-10-10 DIAGNOSIS — R3912 Poor urinary stream: Secondary | ICD-10-CM | POA: Diagnosis not present

## 2015-10-10 DIAGNOSIS — N401 Enlarged prostate with lower urinary tract symptoms: Secondary | ICD-10-CM | POA: Diagnosis not present

## 2015-10-11 DIAGNOSIS — M9902 Segmental and somatic dysfunction of thoracic region: Secondary | ICD-10-CM | POA: Diagnosis not present

## 2015-10-11 DIAGNOSIS — M5032 Other cervical disc degeneration, mid-cervical region, unspecified level: Secondary | ICD-10-CM | POA: Diagnosis not present

## 2015-10-11 DIAGNOSIS — M9901 Segmental and somatic dysfunction of cervical region: Secondary | ICD-10-CM | POA: Diagnosis not present

## 2015-10-11 DIAGNOSIS — M5414 Radiculopathy, thoracic region: Secondary | ICD-10-CM | POA: Diagnosis not present

## 2015-11-14 DIAGNOSIS — I2510 Atherosclerotic heart disease of native coronary artery without angina pectoris: Secondary | ICD-10-CM | POA: Diagnosis not present

## 2015-11-14 DIAGNOSIS — Z23 Encounter for immunization: Secondary | ICD-10-CM | POA: Diagnosis not present

## 2015-11-14 DIAGNOSIS — Z1389 Encounter for screening for other disorder: Secondary | ICD-10-CM | POA: Diagnosis not present

## 2015-11-14 DIAGNOSIS — J4520 Mild intermittent asthma, uncomplicated: Secondary | ICD-10-CM | POA: Diagnosis not present

## 2015-11-14 DIAGNOSIS — Z79899 Other long term (current) drug therapy: Secondary | ICD-10-CM | POA: Diagnosis not present

## 2015-11-14 DIAGNOSIS — I10 Essential (primary) hypertension: Secondary | ICD-10-CM | POA: Diagnosis not present

## 2015-11-14 DIAGNOSIS — K219 Gastro-esophageal reflux disease without esophagitis: Secondary | ICD-10-CM | POA: Diagnosis not present

## 2015-11-14 DIAGNOSIS — K449 Diaphragmatic hernia without obstruction or gangrene: Secondary | ICD-10-CM | POA: Diagnosis not present

## 2015-11-14 DIAGNOSIS — Z0000 Encounter for general adult medical examination without abnormal findings: Secondary | ICD-10-CM | POA: Diagnosis not present

## 2015-11-28 DIAGNOSIS — K6389 Other specified diseases of intestine: Secondary | ICD-10-CM | POA: Diagnosis not present

## 2015-11-30 DIAGNOSIS — M1711 Unilateral primary osteoarthritis, right knee: Secondary | ICD-10-CM | POA: Diagnosis not present

## 2015-11-30 DIAGNOSIS — M7061 Trochanteric bursitis, right hip: Secondary | ICD-10-CM | POA: Diagnosis not present

## 2015-11-30 DIAGNOSIS — M170 Bilateral primary osteoarthritis of knee: Secondary | ICD-10-CM | POA: Diagnosis not present

## 2015-11-30 DIAGNOSIS — M1712 Unilateral primary osteoarthritis, left knee: Secondary | ICD-10-CM | POA: Diagnosis not present

## 2015-12-20 DIAGNOSIS — R634 Abnormal weight loss: Secondary | ICD-10-CM | POA: Diagnosis not present

## 2015-12-20 DIAGNOSIS — R490 Dysphonia: Secondary | ICD-10-CM | POA: Diagnosis not present

## 2015-12-20 DIAGNOSIS — R140 Abdominal distension (gaseous): Secondary | ICD-10-CM | POA: Diagnosis not present

## 2015-12-20 DIAGNOSIS — K219 Gastro-esophageal reflux disease without esophagitis: Secondary | ICD-10-CM | POA: Diagnosis not present

## 2015-12-25 IMAGING — RF DG UGI W/ GASTROGRAFIN
14 of 15 series · 14 of 15 positions shown · IV contrast (omnipaque)
Comparison: Preoperative upper GI 10/24/2013.

FLUOROSCOPY TIME:  41 seconds.

CLINICAL DATA: 81-year-old male status post hiatal hernia repair
with Nissen fundoplication.

EXAM:
WATER SOLUBLE UPPER GI SERIES
TECHNIQUE: Single-column upper GI series was performed using water soluble
contrast.
CONTRAST:  50mL OMNIPAQUE IOHEXOL 300 MG/ML  SOLN

[Series 1: run · 1 of 1 slices shown (1 of 13)]
[im 1/1]
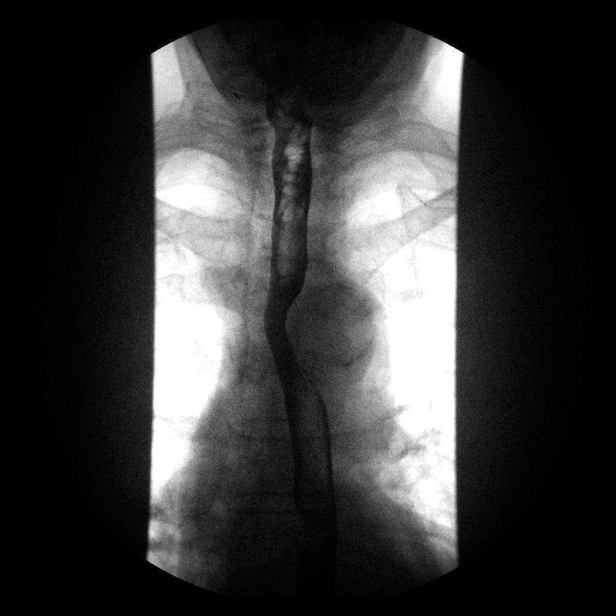

[Series 2: run · 1 of 1 slices shown (2 of 13)]
[im 1/1]
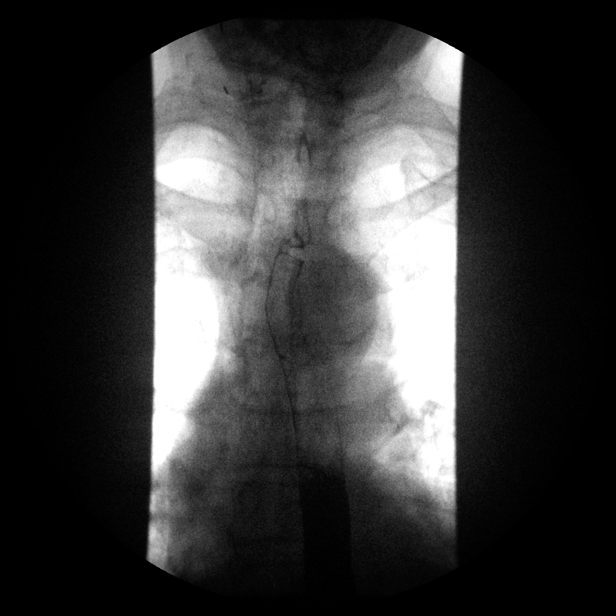

[Series 3: run · 1 of 1 slices shown (3 of 13)]
[im 1/1]
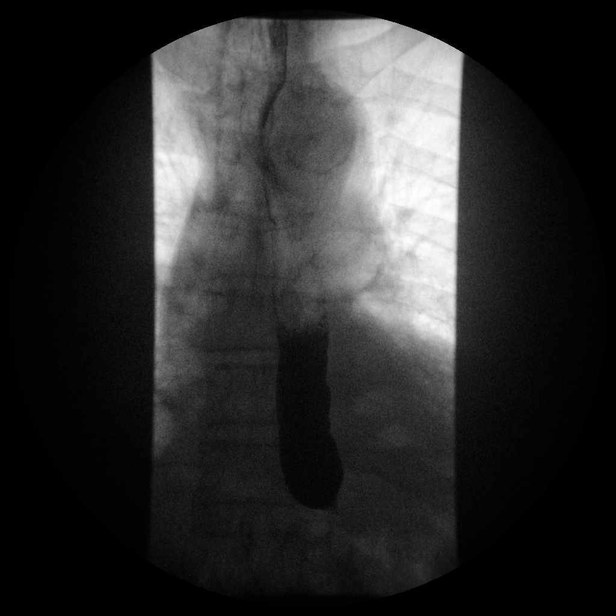

[Series 4: run · 1 of 1 slices shown (4 of 13)]
[im 1/1]
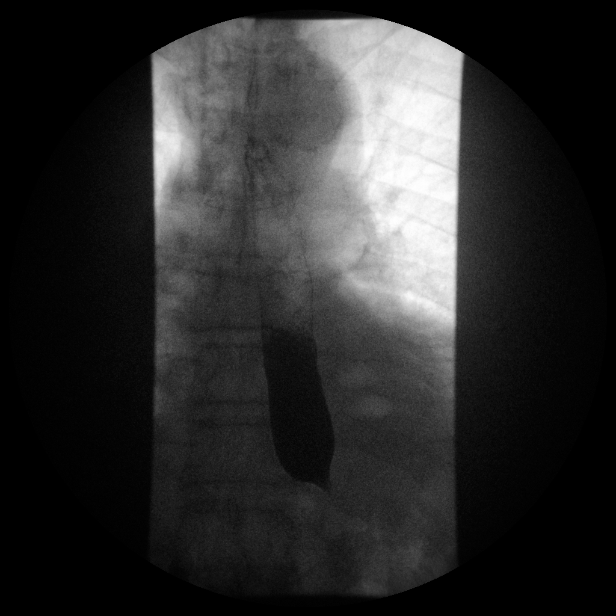

[Series 5: run · 1 of 1 slices shown (5 of 13)]
[im 1/1]
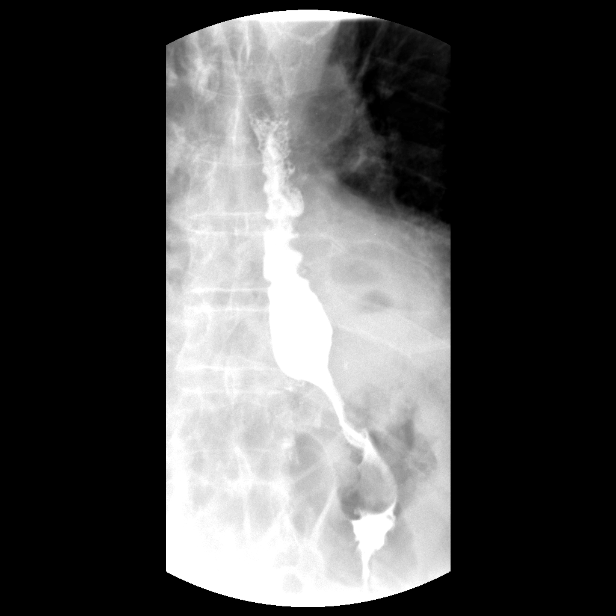

[Series 6: run · 1 of 1 slices shown (6 of 13)]
[im 1/1]
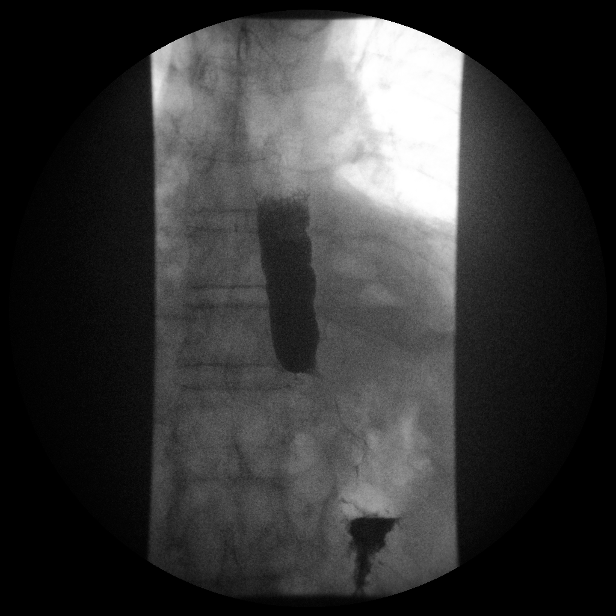

[Series 7: run · 1 of 1 slices shown (7 of 13)]
[im 1/1]
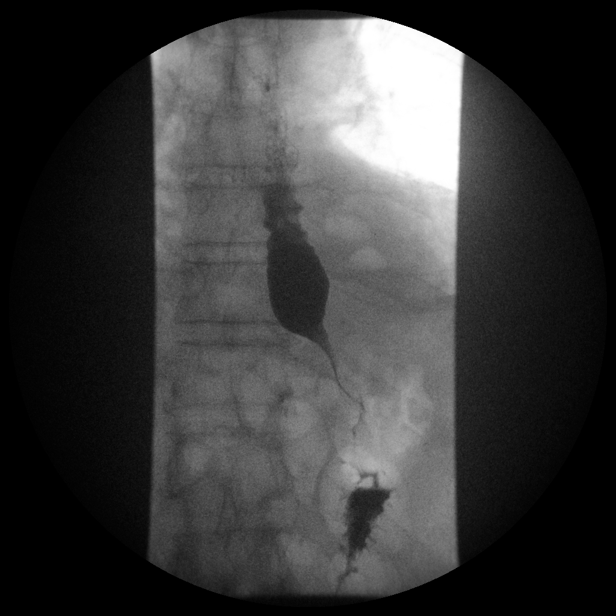

[Series 9: run · 1 of 1 slices shown (8 of 13)]
[im 1/1]
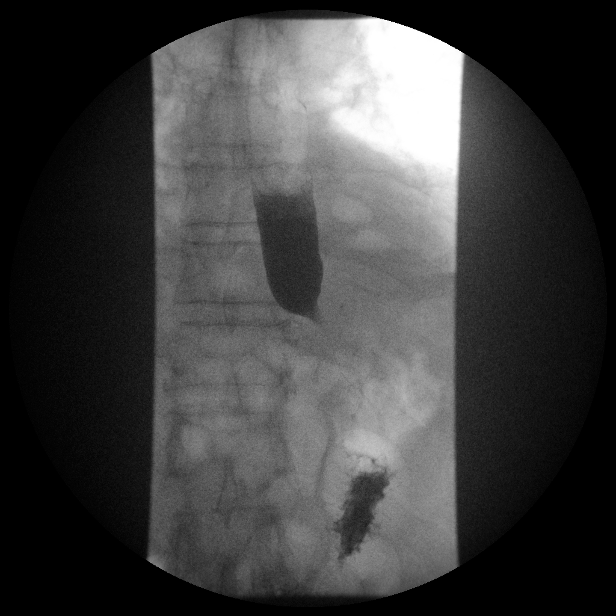

[Series 10: run · 1 of 1 slices shown (9 of 13)]
[im 1/1]
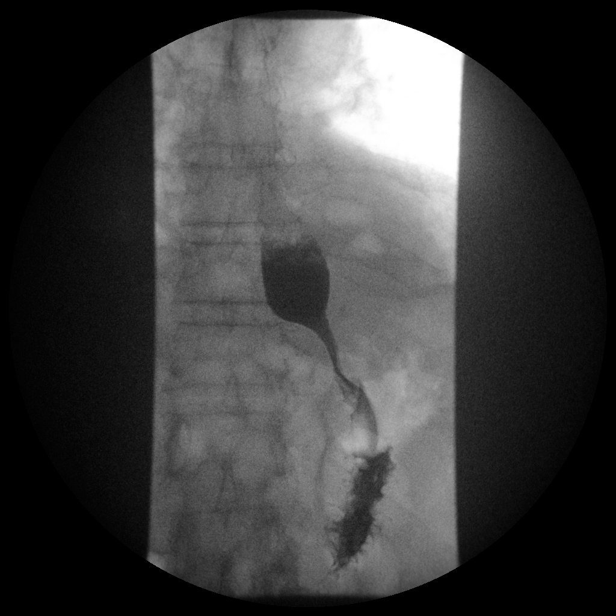

[Series 11: run · 1 of 1 slices shown (10 of 13)]
[im 1/1]
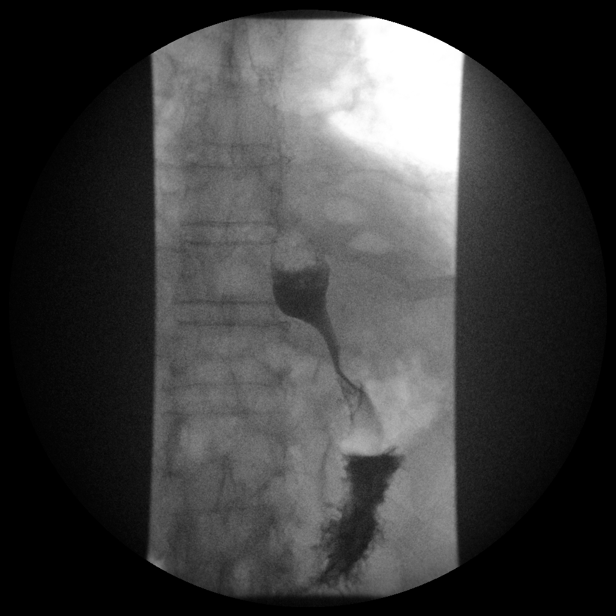

[Series 12: run · 1 of 1 slices shown (11 of 13)]
[im 1/1]
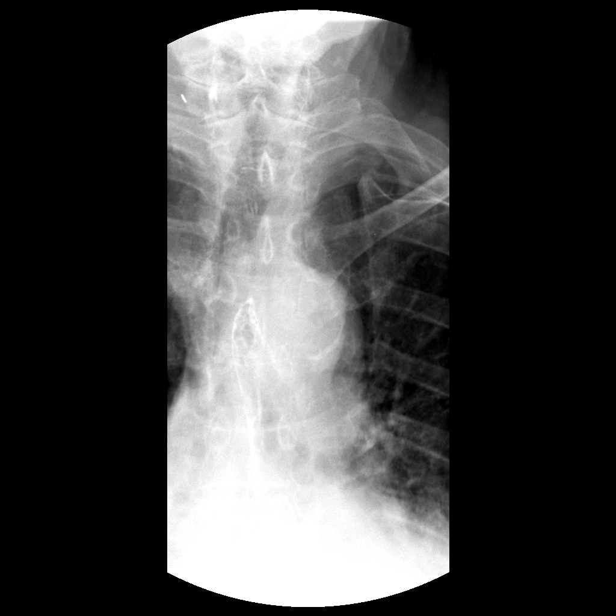

[Series 13: run · 1 of 1 slices shown (12 of 13)]
[im 1/1]
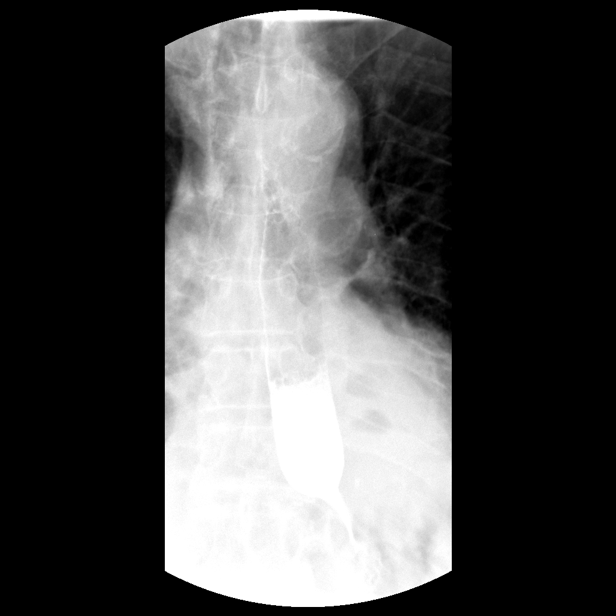

[Series 14: run · 1 of 1 slices shown (13 of 13)]
[im 1/1]
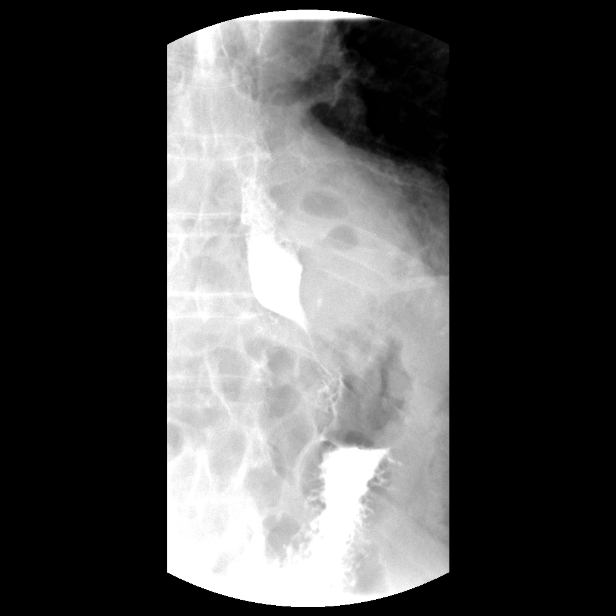

[Series 1001: view not recorded · 0.20mm/px · 1 of 1 slices shown]
[im 1/1]
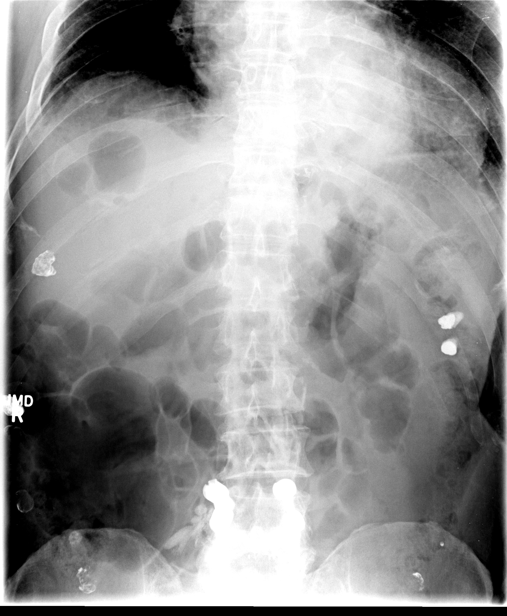

[14 of 15 positions shown; findings below may reference images not displayed]

FINDINGS: Initial KUB demonstrates postoperative changes of PLIF in the lower
lumbar spine. Multiple foci of high attenuation are noted in the
outer aspects of the 4 quadrants of the abdomen, favored to reflect
fecalith and/or retained oral contrast material within multiple
colonic diverticuli. Bowel gas pattern is nonobstructive.

Patient ingested a small amount of water-soluble contrast, and
multiple fluoroscopic images were obtained demonstrating a normal
appearance of the esophagus with a focal narrowing at the level of
the gastroesophageal junction related to Nissen fundoplication.
Contrast readily traversed the area of fundoplication without
evidence of obstruction. Limited images of the stomach demonstrated
a normal appearance of the stomach. No contrast extravasation was
noted during this examination.
IMPRESSION: 1. Expected postoperative appearance of patient status post Nissen
fundoplication, without complicating features, as above.

## 2015-12-27 DIAGNOSIS — K224 Dyskinesia of esophagus: Secondary | ICD-10-CM | POA: Diagnosis not present

## 2015-12-27 DIAGNOSIS — K219 Gastro-esophageal reflux disease without esophagitis: Secondary | ICD-10-CM | POA: Diagnosis not present

## 2015-12-27 DIAGNOSIS — K9189 Other postprocedural complications and disorders of digestive system: Secondary | ICD-10-CM | POA: Diagnosis not present

## 2016-01-01 DIAGNOSIS — R634 Abnormal weight loss: Secondary | ICD-10-CM | POA: Diagnosis not present

## 2016-01-01 DIAGNOSIS — R140 Abdominal distension (gaseous): Secondary | ICD-10-CM | POA: Diagnosis not present

## 2016-01-07 DIAGNOSIS — K219 Gastro-esophageal reflux disease without esophagitis: Secondary | ICD-10-CM | POA: Diagnosis not present

## 2016-01-07 DIAGNOSIS — Z87891 Personal history of nicotine dependence: Secondary | ICD-10-CM | POA: Diagnosis not present

## 2016-01-07 DIAGNOSIS — R490 Dysphonia: Secondary | ICD-10-CM | POA: Diagnosis not present

## 2016-05-06 DIAGNOSIS — L309 Dermatitis, unspecified: Secondary | ICD-10-CM | POA: Diagnosis not present

## 2016-05-06 DIAGNOSIS — L270 Generalized skin eruption due to drugs and medicaments taken internally: Secondary | ICD-10-CM | POA: Diagnosis not present

## 2016-06-10 DIAGNOSIS — L814 Other melanin hyperpigmentation: Secondary | ICD-10-CM | POA: Diagnosis not present

## 2016-06-10 DIAGNOSIS — L820 Inflamed seborrheic keratosis: Secondary | ICD-10-CM | POA: Diagnosis not present

## 2016-06-10 DIAGNOSIS — L821 Other seborrheic keratosis: Secondary | ICD-10-CM | POA: Diagnosis not present

## 2016-06-10 DIAGNOSIS — Z85828 Personal history of other malignant neoplasm of skin: Secondary | ICD-10-CM | POA: Diagnosis not present

## 2016-06-13 ENCOUNTER — Other Ambulatory Visit (HOSPITAL_COMMUNITY): Payer: Self-pay | Admitting: Surgery

## 2016-06-13 DIAGNOSIS — K219 Gastro-esophageal reflux disease without esophagitis: Secondary | ICD-10-CM

## 2016-06-16 DIAGNOSIS — A049 Bacterial intestinal infection, unspecified: Secondary | ICD-10-CM | POA: Diagnosis not present

## 2016-06-16 DIAGNOSIS — R140 Abdominal distension (gaseous): Secondary | ICD-10-CM | POA: Diagnosis not present

## 2016-06-18 DIAGNOSIS — Z981 Arthrodesis status: Secondary | ICD-10-CM | POA: Diagnosis not present

## 2016-06-18 DIAGNOSIS — R5383 Other fatigue: Secondary | ICD-10-CM | POA: Diagnosis not present

## 2016-06-18 DIAGNOSIS — Z79899 Other long term (current) drug therapy: Secondary | ICD-10-CM | POA: Diagnosis not present

## 2016-06-18 DIAGNOSIS — Z955 Presence of coronary angioplasty implant and graft: Secondary | ICD-10-CM | POA: Diagnosis not present

## 2016-06-18 DIAGNOSIS — J040 Acute laryngitis: Secondary | ICD-10-CM | POA: Diagnosis not present

## 2016-06-18 DIAGNOSIS — K580 Irritable bowel syndrome with diarrhea: Secondary | ICD-10-CM | POA: Diagnosis not present

## 2016-06-18 DIAGNOSIS — R109 Unspecified abdominal pain: Secondary | ICD-10-CM | POA: Diagnosis not present

## 2016-06-18 DIAGNOSIS — Z8719 Personal history of other diseases of the digestive system: Secondary | ICD-10-CM | POA: Diagnosis not present

## 2016-06-18 DIAGNOSIS — Z7982 Long term (current) use of aspirin: Secondary | ICD-10-CM | POA: Diagnosis not present

## 2016-06-18 DIAGNOSIS — Z9889 Other specified postprocedural states: Secondary | ICD-10-CM | POA: Diagnosis not present

## 2016-06-18 DIAGNOSIS — R140 Abdominal distension (gaseous): Secondary | ICD-10-CM | POA: Diagnosis not present

## 2016-06-18 DIAGNOSIS — Z8249 Family history of ischemic heart disease and other diseases of the circulatory system: Secondary | ICD-10-CM | POA: Diagnosis not present

## 2016-06-18 DIAGNOSIS — Z8673 Personal history of transient ischemic attack (TIA), and cerebral infarction without residual deficits: Secondary | ICD-10-CM | POA: Diagnosis not present

## 2016-06-18 DIAGNOSIS — Z8261 Family history of arthritis: Secondary | ICD-10-CM | POA: Diagnosis not present

## 2016-06-18 DIAGNOSIS — I2510 Atherosclerotic heart disease of native coronary artery without angina pectoris: Secondary | ICD-10-CM | POA: Diagnosis not present

## 2016-06-18 DIAGNOSIS — Z880 Allergy status to penicillin: Secondary | ICD-10-CM | POA: Diagnosis not present

## 2016-06-18 DIAGNOSIS — Z87891 Personal history of nicotine dependence: Secondary | ICD-10-CM | POA: Diagnosis not present

## 2016-06-18 DIAGNOSIS — G4733 Obstructive sleep apnea (adult) (pediatric): Secondary | ICD-10-CM | POA: Diagnosis not present

## 2016-06-18 DIAGNOSIS — E119 Type 2 diabetes mellitus without complications: Secondary | ICD-10-CM | POA: Diagnosis not present

## 2016-06-19 DIAGNOSIS — K589 Irritable bowel syndrome without diarrhea: Secondary | ICD-10-CM | POA: Diagnosis not present

## 2016-06-19 DIAGNOSIS — J3089 Other allergic rhinitis: Secondary | ICD-10-CM | POA: Diagnosis not present

## 2016-06-19 DIAGNOSIS — K219 Gastro-esophageal reflux disease without esophagitis: Secondary | ICD-10-CM | POA: Diagnosis not present

## 2016-06-19 DIAGNOSIS — K3184 Gastroparesis: Secondary | ICD-10-CM | POA: Diagnosis not present

## 2016-06-19 DIAGNOSIS — I10 Essential (primary) hypertension: Secondary | ICD-10-CM | POA: Diagnosis not present

## 2016-06-25 DIAGNOSIS — M7061 Trochanteric bursitis, right hip: Secondary | ICD-10-CM | POA: Diagnosis not present

## 2016-06-25 DIAGNOSIS — M170 Bilateral primary osteoarthritis of knee: Secondary | ICD-10-CM | POA: Diagnosis not present

## 2016-06-25 DIAGNOSIS — M1711 Unilateral primary osteoarthritis, right knee: Secondary | ICD-10-CM | POA: Diagnosis not present

## 2016-06-27 ENCOUNTER — Ambulatory Visit (HOSPITAL_COMMUNITY)
Admission: RE | Admit: 2016-06-27 | Discharge: 2016-06-27 | Disposition: A | Discharge status: Home / Self Care | Payer: Medicare Other | Source: Ambulatory Visit | Attending: Surgery | Admitting: Surgery

## 2016-06-27 DIAGNOSIS — K449 Diaphragmatic hernia without obstruction or gangrene: Secondary | ICD-10-CM | POA: Diagnosis not present

## 2016-06-27 DIAGNOSIS — Z9889 Other specified postprocedural states: Secondary | ICD-10-CM | POA: Diagnosis not present

## 2016-06-27 DIAGNOSIS — K219 Gastro-esophageal reflux disease without esophagitis: Secondary | ICD-10-CM | POA: Insufficient documentation

## 2016-07-02 DIAGNOSIS — M1711 Unilateral primary osteoarthritis, right knee: Secondary | ICD-10-CM | POA: Diagnosis not present

## 2016-07-07 DIAGNOSIS — M79642 Pain in left hand: Secondary | ICD-10-CM | POA: Diagnosis not present

## 2016-07-07 DIAGNOSIS — M79645 Pain in left finger(s): Secondary | ICD-10-CM | POA: Diagnosis not present

## 2016-07-07 DIAGNOSIS — M1812 Unilateral primary osteoarthritis of first carpometacarpal joint, left hand: Secondary | ICD-10-CM | POA: Diagnosis not present

## 2016-07-09 DIAGNOSIS — M1711 Unilateral primary osteoarthritis, right knee: Secondary | ICD-10-CM | POA: Diagnosis not present

## 2016-07-24 DIAGNOSIS — K6389 Other specified diseases of intestine: Secondary | ICD-10-CM | POA: Diagnosis not present

## 2016-07-25 DIAGNOSIS — K6389 Other specified diseases of intestine: Secondary | ICD-10-CM | POA: Diagnosis not present

## 2016-07-25 DIAGNOSIS — K589 Irritable bowel syndrome without diarrhea: Secondary | ICD-10-CM | POA: Diagnosis not present

## 2016-10-22 DIAGNOSIS — Z955 Presence of coronary angioplasty implant and graft: Secondary | ICD-10-CM | POA: Diagnosis not present

## 2016-10-22 DIAGNOSIS — Z713 Dietary counseling and surveillance: Secondary | ICD-10-CM | POA: Diagnosis not present

## 2016-10-22 DIAGNOSIS — R140 Abdominal distension (gaseous): Secondary | ICD-10-CM | POA: Diagnosis not present

## 2016-10-22 DIAGNOSIS — G4733 Obstructive sleep apnea (adult) (pediatric): Secondary | ICD-10-CM | POA: Diagnosis not present

## 2016-10-22 DIAGNOSIS — K219 Gastro-esophageal reflux disease without esophagitis: Secondary | ICD-10-CM | POA: Diagnosis not present

## 2016-10-22 DIAGNOSIS — Z8719 Personal history of other diseases of the digestive system: Secondary | ICD-10-CM | POA: Diagnosis not present

## 2016-10-22 DIAGNOSIS — Z87891 Personal history of nicotine dependence: Secondary | ICD-10-CM | POA: Diagnosis not present

## 2016-10-22 DIAGNOSIS — Z8673 Personal history of transient ischemic attack (TIA), and cerebral infarction without residual deficits: Secondary | ICD-10-CM | POA: Diagnosis not present

## 2016-10-22 DIAGNOSIS — I2510 Atherosclerotic heart disease of native coronary artery without angina pectoris: Secondary | ICD-10-CM | POA: Diagnosis not present

## 2016-10-22 DIAGNOSIS — R5383 Other fatigue: Secondary | ICD-10-CM | POA: Diagnosis not present

## 2016-10-22 DIAGNOSIS — Z7982 Long term (current) use of aspirin: Secondary | ICD-10-CM | POA: Diagnosis not present

## 2016-10-22 DIAGNOSIS — Z880 Allergy status to penicillin: Secondary | ICD-10-CM | POA: Diagnosis not present

## 2016-11-13 DIAGNOSIS — E782 Mixed hyperlipidemia: Secondary | ICD-10-CM | POA: Diagnosis not present

## 2016-11-13 DIAGNOSIS — G4700 Insomnia, unspecified: Secondary | ICD-10-CM | POA: Diagnosis not present

## 2016-11-13 DIAGNOSIS — I2510 Atherosclerotic heart disease of native coronary artery without angina pectoris: Secondary | ICD-10-CM | POA: Diagnosis not present

## 2016-11-13 DIAGNOSIS — K219 Gastro-esophageal reflux disease without esophagitis: Secondary | ICD-10-CM | POA: Diagnosis not present

## 2016-11-13 DIAGNOSIS — Z1389 Encounter for screening for other disorder: Secondary | ICD-10-CM | POA: Diagnosis not present

## 2016-11-13 DIAGNOSIS — Z0000 Encounter for general adult medical examination without abnormal findings: Secondary | ICD-10-CM | POA: Diagnosis not present

## 2016-11-13 DIAGNOSIS — Z1211 Encounter for screening for malignant neoplasm of colon: Secondary | ICD-10-CM | POA: Diagnosis not present

## 2016-11-13 DIAGNOSIS — I10 Essential (primary) hypertension: Secondary | ICD-10-CM | POA: Diagnosis not present

## 2016-11-13 DIAGNOSIS — Z23 Encounter for immunization: Secondary | ICD-10-CM | POA: Diagnosis not present

## 2016-11-13 DIAGNOSIS — K589 Irritable bowel syndrome without diarrhea: Secondary | ICD-10-CM | POA: Diagnosis not present

## 2016-11-19 DIAGNOSIS — M1711 Unilateral primary osteoarthritis, right knee: Secondary | ICD-10-CM | POA: Diagnosis not present

## 2016-11-19 DIAGNOSIS — M1712 Unilateral primary osteoarthritis, left knee: Secondary | ICD-10-CM | POA: Diagnosis not present

## 2016-11-19 DIAGNOSIS — M7061 Trochanteric bursitis, right hip: Secondary | ICD-10-CM | POA: Diagnosis not present

## 2016-11-19 DIAGNOSIS — M170 Bilateral primary osteoarthritis of knee: Secondary | ICD-10-CM | POA: Diagnosis not present

## 2016-11-25 DIAGNOSIS — M7061 Trochanteric bursitis, right hip: Secondary | ICD-10-CM | POA: Diagnosis not present

## 2016-12-04 DIAGNOSIS — M7061 Trochanteric bursitis, right hip: Secondary | ICD-10-CM | POA: Diagnosis not present

## 2016-12-05 DIAGNOSIS — L820 Inflamed seborrheic keratosis: Secondary | ICD-10-CM | POA: Diagnosis not present

## 2016-12-05 DIAGNOSIS — Z85828 Personal history of other malignant neoplasm of skin: Secondary | ICD-10-CM | POA: Diagnosis not present

## 2016-12-05 DIAGNOSIS — L812 Freckles: Secondary | ICD-10-CM | POA: Diagnosis not present

## 2016-12-05 DIAGNOSIS — L821 Other seborrheic keratosis: Secondary | ICD-10-CM | POA: Diagnosis not present

## 2016-12-05 DIAGNOSIS — D1801 Hemangioma of skin and subcutaneous tissue: Secondary | ICD-10-CM | POA: Diagnosis not present

## 2017-04-04 DIAGNOSIS — J019 Acute sinusitis, unspecified: Secondary | ICD-10-CM | POA: Diagnosis not present

## 2017-06-16 DIAGNOSIS — G479 Sleep disorder, unspecified: Secondary | ICD-10-CM | POA: Diagnosis not present

## 2017-06-16 DIAGNOSIS — I10 Essential (primary) hypertension: Secondary | ICD-10-CM | POA: Diagnosis not present

## 2017-06-16 DIAGNOSIS — R5383 Other fatigue: Secondary | ICD-10-CM | POA: Diagnosis not present

## 2017-06-16 DIAGNOSIS — H8112 Benign paroxysmal vertigo, left ear: Secondary | ICD-10-CM | POA: Diagnosis not present

## 2017-06-16 DIAGNOSIS — R413 Other amnesia: Secondary | ICD-10-CM | POA: Diagnosis not present

## 2017-06-17 DIAGNOSIS — K6389 Other specified diseases of intestine: Secondary | ICD-10-CM | POA: Diagnosis not present

## 2017-06-22 DIAGNOSIS — L821 Other seborrheic keratosis: Secondary | ICD-10-CM | POA: Diagnosis not present

## 2017-06-22 DIAGNOSIS — D1801 Hemangioma of skin and subcutaneous tissue: Secondary | ICD-10-CM | POA: Diagnosis not present

## 2017-06-22 DIAGNOSIS — L570 Actinic keratosis: Secondary | ICD-10-CM | POA: Diagnosis not present

## 2017-06-22 DIAGNOSIS — L820 Inflamed seborrheic keratosis: Secondary | ICD-10-CM | POA: Diagnosis not present

## 2017-06-22 DIAGNOSIS — L812 Freckles: Secondary | ICD-10-CM | POA: Diagnosis not present

## 2017-06-22 DIAGNOSIS — Z85828 Personal history of other malignant neoplasm of skin: Secondary | ICD-10-CM | POA: Diagnosis not present

## 2017-06-25 DIAGNOSIS — K219 Gastro-esophageal reflux disease without esophagitis: Secondary | ICD-10-CM | POA: Diagnosis not present

## 2017-06-25 DIAGNOSIS — R140 Abdominal distension (gaseous): Secondary | ICD-10-CM | POA: Diagnosis not present

## 2017-06-25 DIAGNOSIS — Z86010 Personal history of colonic polyps: Secondary | ICD-10-CM | POA: Diagnosis not present

## 2017-06-26 DIAGNOSIS — M1712 Unilateral primary osteoarthritis, left knee: Secondary | ICD-10-CM | POA: Diagnosis not present

## 2017-06-26 DIAGNOSIS — M1711 Unilateral primary osteoarthritis, right knee: Secondary | ICD-10-CM | POA: Diagnosis not present

## 2017-08-05 DIAGNOSIS — G4700 Insomnia, unspecified: Secondary | ICD-10-CM | POA: Diagnosis not present

## 2017-08-05 DIAGNOSIS — F411 Generalized anxiety disorder: Secondary | ICD-10-CM | POA: Diagnosis not present

## 2017-10-05 DIAGNOSIS — G4700 Insomnia, unspecified: Secondary | ICD-10-CM | POA: Diagnosis not present

## 2017-10-27 DIAGNOSIS — H02052 Trichiasis without entropian right lower eyelid: Secondary | ICD-10-CM | POA: Diagnosis not present

## 2017-10-27 DIAGNOSIS — H02032 Senile entropion of right lower eyelid: Secondary | ICD-10-CM | POA: Diagnosis not present

## 2017-10-27 DIAGNOSIS — H02015 Cicatricial entropion of left lower eyelid: Secondary | ICD-10-CM | POA: Diagnosis not present

## 2017-10-27 DIAGNOSIS — H02012 Cicatricial entropion of right lower eyelid: Secondary | ICD-10-CM | POA: Diagnosis not present

## 2017-10-27 DIAGNOSIS — H02035 Senile entropion of left lower eyelid: Secondary | ICD-10-CM | POA: Diagnosis not present

## 2017-10-27 DIAGNOSIS — H02055 Trichiasis without entropian left lower eyelid: Secondary | ICD-10-CM | POA: Diagnosis not present

## 2017-11-02 DIAGNOSIS — Z23 Encounter for immunization: Secondary | ICD-10-CM | POA: Diagnosis not present

## 2017-11-05 DIAGNOSIS — M7061 Trochanteric bursitis, right hip: Secondary | ICD-10-CM | POA: Diagnosis not present

## 2017-11-05 DIAGNOSIS — M1711 Unilateral primary osteoarthritis, right knee: Secondary | ICD-10-CM | POA: Diagnosis not present

## 2017-11-13 DIAGNOSIS — M1711 Unilateral primary osteoarthritis, right knee: Secondary | ICD-10-CM | POA: Diagnosis not present

## 2017-11-19 DIAGNOSIS — R140 Abdominal distension (gaseous): Secondary | ICD-10-CM | POA: Diagnosis not present

## 2017-11-20 DIAGNOSIS — M1711 Unilateral primary osteoarthritis, right knee: Secondary | ICD-10-CM | POA: Diagnosis not present

## 2017-11-24 DIAGNOSIS — H02055 Trichiasis without entropian left lower eyelid: Secondary | ICD-10-CM | POA: Diagnosis not present

## 2017-11-24 DIAGNOSIS — H02015 Cicatricial entropion of left lower eyelid: Secondary | ICD-10-CM | POA: Diagnosis not present

## 2017-11-24 DIAGNOSIS — H02012 Cicatricial entropion of right lower eyelid: Secondary | ICD-10-CM | POA: Diagnosis not present

## 2017-11-24 DIAGNOSIS — H02532 Eyelid retraction right lower eyelid: Secondary | ICD-10-CM | POA: Diagnosis not present

## 2017-11-24 DIAGNOSIS — H02052 Trichiasis without entropian right lower eyelid: Secondary | ICD-10-CM | POA: Diagnosis not present

## 2017-11-24 DIAGNOSIS — H0012 Chalazion right lower eyelid: Secondary | ICD-10-CM | POA: Diagnosis not present

## 2017-11-24 DIAGNOSIS — H02035 Senile entropion of left lower eyelid: Secondary | ICD-10-CM | POA: Diagnosis not present

## 2017-11-24 DIAGNOSIS — H02032 Senile entropion of right lower eyelid: Secondary | ICD-10-CM | POA: Diagnosis not present

## 2017-11-24 DIAGNOSIS — H02535 Eyelid retraction left lower eyelid: Secondary | ICD-10-CM | POA: Diagnosis not present

## 2017-11-30 DIAGNOSIS — L812 Freckles: Secondary | ICD-10-CM | POA: Diagnosis not present

## 2017-11-30 DIAGNOSIS — Z85828 Personal history of other malignant neoplasm of skin: Secondary | ICD-10-CM | POA: Diagnosis not present

## 2017-11-30 DIAGNOSIS — D485 Neoplasm of uncertain behavior of skin: Secondary | ICD-10-CM | POA: Diagnosis not present

## 2017-11-30 DIAGNOSIS — L821 Other seborrheic keratosis: Secondary | ICD-10-CM | POA: Diagnosis not present

## 2017-11-30 DIAGNOSIS — D692 Other nonthrombocytopenic purpura: Secondary | ICD-10-CM | POA: Diagnosis not present

## 2017-11-30 DIAGNOSIS — D1801 Hemangioma of skin and subcutaneous tissue: Secondary | ICD-10-CM | POA: Diagnosis not present

## 2017-12-11 DIAGNOSIS — E782 Mixed hyperlipidemia: Secondary | ICD-10-CM | POA: Diagnosis not present

## 2017-12-11 DIAGNOSIS — K589 Irritable bowel syndrome without diarrhea: Secondary | ICD-10-CM | POA: Diagnosis not present

## 2017-12-11 DIAGNOSIS — G479 Sleep disorder, unspecified: Secondary | ICD-10-CM | POA: Diagnosis not present

## 2017-12-11 DIAGNOSIS — Z1389 Encounter for screening for other disorder: Secondary | ICD-10-CM | POA: Diagnosis not present

## 2017-12-11 DIAGNOSIS — I10 Essential (primary) hypertension: Secondary | ICD-10-CM | POA: Diagnosis not present

## 2017-12-11 DIAGNOSIS — I2510 Atherosclerotic heart disease of native coronary artery without angina pectoris: Secondary | ICD-10-CM | POA: Diagnosis not present

## 2017-12-11 DIAGNOSIS — Z0000 Encounter for general adult medical examination without abnormal findings: Secondary | ICD-10-CM | POA: Diagnosis not present

## 2017-12-11 DIAGNOSIS — F411 Generalized anxiety disorder: Secondary | ICD-10-CM | POA: Diagnosis not present

## 2018-01-02 DIAGNOSIS — R4589 Other symptoms and signs involving emotional state: Secondary | ICD-10-CM | POA: Diagnosis not present

## 2018-01-02 DIAGNOSIS — R001 Bradycardia, unspecified: Secondary | ICD-10-CM | POA: Diagnosis not present

## 2018-01-02 DIAGNOSIS — R079 Chest pain, unspecified: Secondary | ICD-10-CM | POA: Diagnosis not present

## 2018-01-02 DIAGNOSIS — Z87891 Personal history of nicotine dependence: Secondary | ICD-10-CM | POA: Diagnosis not present

## 2018-01-02 DIAGNOSIS — R002 Palpitations: Secondary | ICD-10-CM | POA: Diagnosis not present

## 2018-01-02 DIAGNOSIS — Z7982 Long term (current) use of aspirin: Secondary | ICD-10-CM | POA: Diagnosis not present

## 2018-01-02 DIAGNOSIS — F419 Anxiety disorder, unspecified: Secondary | ICD-10-CM | POA: Diagnosis not present

## 2018-01-02 DIAGNOSIS — Z981 Arthrodesis status: Secondary | ICD-10-CM | POA: Diagnosis not present

## 2018-01-02 DIAGNOSIS — Z955 Presence of coronary angioplasty implant and graft: Secondary | ICD-10-CM | POA: Diagnosis not present

## 2018-01-02 DIAGNOSIS — Z9989 Dependence on other enabling machines and devices: Secondary | ICD-10-CM | POA: Diagnosis not present

## 2018-01-02 DIAGNOSIS — Z8673 Personal history of transient ischemic attack (TIA), and cerebral infarction without residual deficits: Secondary | ICD-10-CM | POA: Diagnosis not present

## 2018-01-02 DIAGNOSIS — I10 Essential (primary) hypertension: Secondary | ICD-10-CM | POA: Diagnosis not present

## 2018-01-02 DIAGNOSIS — Z7951 Long term (current) use of inhaled steroids: Secondary | ICD-10-CM | POA: Diagnosis not present

## 2018-06-22 DIAGNOSIS — J4520 Mild intermittent asthma, uncomplicated: Secondary | ICD-10-CM | POA: Diagnosis not present

## 2018-06-22 DIAGNOSIS — F411 Generalized anxiety disorder: Secondary | ICD-10-CM | POA: Diagnosis not present

## 2018-06-22 DIAGNOSIS — G479 Sleep disorder, unspecified: Secondary | ICD-10-CM | POA: Diagnosis not present

## 2018-06-22 DIAGNOSIS — K589 Irritable bowel syndrome without diarrhea: Secondary | ICD-10-CM | POA: Diagnosis not present

## 2018-06-22 DIAGNOSIS — I2510 Atherosclerotic heart disease of native coronary artery without angina pectoris: Secondary | ICD-10-CM | POA: Diagnosis not present

## 2018-06-22 DIAGNOSIS — I10 Essential (primary) hypertension: Secondary | ICD-10-CM | POA: Diagnosis not present

## 2018-06-24 DIAGNOSIS — L820 Inflamed seborrheic keratosis: Secondary | ICD-10-CM | POA: Diagnosis not present

## 2018-06-24 DIAGNOSIS — L218 Other seborrheic dermatitis: Secondary | ICD-10-CM | POA: Diagnosis not present

## 2018-06-24 DIAGNOSIS — L570 Actinic keratosis: Secondary | ICD-10-CM | POA: Diagnosis not present

## 2018-06-24 DIAGNOSIS — Z85828 Personal history of other malignant neoplasm of skin: Secondary | ICD-10-CM | POA: Diagnosis not present

## 2018-06-24 DIAGNOSIS — L821 Other seborrheic keratosis: Secondary | ICD-10-CM | POA: Diagnosis not present

## 2018-06-24 DIAGNOSIS — D1801 Hemangioma of skin and subcutaneous tissue: Secondary | ICD-10-CM | POA: Diagnosis not present

## 2018-07-01 DIAGNOSIS — R140 Abdominal distension (gaseous): Secondary | ICD-10-CM | POA: Diagnosis not present

## 2018-07-06 DIAGNOSIS — R140 Abdominal distension (gaseous): Secondary | ICD-10-CM | POA: Diagnosis not present

## 2018-07-07 DIAGNOSIS — G4700 Insomnia, unspecified: Secondary | ICD-10-CM | POA: Diagnosis not present

## 2018-07-28 DIAGNOSIS — R140 Abdominal distension (gaseous): Secondary | ICD-10-CM | POA: Diagnosis not present

## 2018-08-25 ENCOUNTER — Telehealth: Payer: Self-pay

## 2018-08-25 NOTE — Telephone Encounter (Signed)
NOTES ON FILE FROM [facility] [phone], SENT REFERRAL TO SCHEDULING

## 2018-09-10 DIAGNOSIS — M7061 Trochanteric bursitis, right hip: Secondary | ICD-10-CM | POA: Diagnosis not present

## 2018-09-10 DIAGNOSIS — M25561 Pain in right knee: Secondary | ICD-10-CM | POA: Diagnosis not present

## 2018-10-08 ENCOUNTER — Ambulatory Visit
Admission: RE | Admit: 2018-10-08 | Discharge: 2018-10-08 | Disposition: A | Discharge status: Home / Self Care | Payer: Medicare Other | Source: Ambulatory Visit | Attending: Internal Medicine | Admitting: Internal Medicine

## 2018-10-08 ENCOUNTER — Other Ambulatory Visit: Payer: Self-pay | Admitting: Internal Medicine

## 2018-10-08 DIAGNOSIS — R519 Headache, unspecified: Secondary | ICD-10-CM

## 2018-10-08 DIAGNOSIS — R51 Headache: Secondary | ICD-10-CM | POA: Diagnosis not present

## 2018-11-12 DIAGNOSIS — Z23 Encounter for immunization: Secondary | ICD-10-CM | POA: Diagnosis not present

## 2018-12-01 DIAGNOSIS — L821 Other seborrheic keratosis: Secondary | ICD-10-CM | POA: Diagnosis not present

## 2018-12-01 DIAGNOSIS — Z85828 Personal history of other malignant neoplasm of skin: Secondary | ICD-10-CM | POA: Diagnosis not present

## 2018-12-01 DIAGNOSIS — D1801 Hemangioma of skin and subcutaneous tissue: Secondary | ICD-10-CM | POA: Diagnosis not present

## 2018-12-01 DIAGNOSIS — L812 Freckles: Secondary | ICD-10-CM | POA: Diagnosis not present

## 2019-01-27 DIAGNOSIS — H18891 Other specified disorders of cornea, right eye: Secondary | ICD-10-CM | POA: Diagnosis not present

## 2019-02-01 DIAGNOSIS — H02022 Mechanical entropion of right lower eyelid: Secondary | ICD-10-CM | POA: Diagnosis not present

## 2019-02-01 DIAGNOSIS — H18891 Other specified disorders of cornea, right eye: Secondary | ICD-10-CM | POA: Diagnosis not present

## 2019-02-01 DIAGNOSIS — H10021 Other mucopurulent conjunctivitis, right eye: Secondary | ICD-10-CM | POA: Diagnosis not present

## 2019-03-01 ENCOUNTER — Ambulatory Visit: Payer: Medicare Other | Attending: Internal Medicine

## 2019-03-01 DIAGNOSIS — Z20822 Contact with and (suspected) exposure to covid-19: Secondary | ICD-10-CM | POA: Diagnosis not present

## 2019-03-03 LAB — NOVEL CORONAVIRUS, NAA: SARS-CoV-2, NAA: NOT DETECTED

## 2019-03-21 ENCOUNTER — Ambulatory Visit: Payer: Medicare Other | Attending: Internal Medicine

## 2019-03-21 DIAGNOSIS — Z23 Encounter for immunization: Secondary | ICD-10-CM | POA: Insufficient documentation

## 2019-03-21 NOTE — Progress Notes (Signed)
   Covid-19 Vaccination Clinic  Name:  Alex Oconnor    MRN: [id number] DOB: February 01, 1933  03/21/2019  Alex Oconnor was observed post Covid-19 immunization for 15 minutes without incidence. He was provided with Vaccine Information Sheet and instruction to access the V-Safe system.   Alex Oconnor was instructed to call 911 with any severe reactions post vaccine: Marland Kitchen Difficulty breathing  . Swelling of your face and throat  . A fast heartbeat  . A bad rash all over your body  . Dizziness and weakness    Immunizations Administered    Name Date Dose VIS Date Route   Pfizer COVID-19 Vaccine 03/21/2019 10:21 AM 0.3 mL 02/04/2019 Intramuscular   Manufacturer: [place]   Lot: [id number]   [place]: [id number]

## 2019-03-22 DIAGNOSIS — J4520 Mild intermittent asthma, uncomplicated: Secondary | ICD-10-CM | POA: Diagnosis not present

## 2019-03-22 DIAGNOSIS — F411 Generalized anxiety disorder: Secondary | ICD-10-CM | POA: Diagnosis not present

## 2019-03-22 DIAGNOSIS — I2510 Atherosclerotic heart disease of native coronary artery without angina pectoris: Secondary | ICD-10-CM | POA: Diagnosis not present

## 2019-03-22 DIAGNOSIS — E782 Mixed hyperlipidemia: Secondary | ICD-10-CM | POA: Diagnosis not present

## 2019-03-22 DIAGNOSIS — Z0000 Encounter for general adult medical examination without abnormal findings: Secondary | ICD-10-CM | POA: Diagnosis not present

## 2019-03-22 DIAGNOSIS — Z1389 Encounter for screening for other disorder: Secondary | ICD-10-CM | POA: Diagnosis not present

## 2019-03-22 DIAGNOSIS — I10 Essential (primary) hypertension: Secondary | ICD-10-CM | POA: Diagnosis not present

## 2019-03-23 DIAGNOSIS — M7061 Trochanteric bursitis, right hip: Secondary | ICD-10-CM | POA: Diagnosis not present

## 2019-03-23 DIAGNOSIS — M25562 Pain in left knee: Secondary | ICD-10-CM | POA: Diagnosis not present

## 2019-03-23 DIAGNOSIS — M25552 Pain in left hip: Secondary | ICD-10-CM | POA: Diagnosis not present

## 2019-03-23 DIAGNOSIS — M25561 Pain in right knee: Secondary | ICD-10-CM | POA: Diagnosis not present

## 2019-03-24 DIAGNOSIS — M25561 Pain in right knee: Secondary | ICD-10-CM | POA: Diagnosis not present

## 2019-03-24 DIAGNOSIS — M25551 Pain in right hip: Secondary | ICD-10-CM | POA: Diagnosis not present

## 2019-03-24 DIAGNOSIS — M25562 Pain in left knee: Secondary | ICD-10-CM | POA: Diagnosis not present

## 2019-03-24 DIAGNOSIS — M25552 Pain in left hip: Secondary | ICD-10-CM | POA: Diagnosis not present

## 2019-03-29 DIAGNOSIS — M25552 Pain in left hip: Secondary | ICD-10-CM | POA: Diagnosis not present

## 2019-03-29 DIAGNOSIS — M25562 Pain in left knee: Secondary | ICD-10-CM | POA: Diagnosis not present

## 2019-04-11 ENCOUNTER — Ambulatory Visit: Payer: Medicare Other | Attending: Internal Medicine

## 2019-04-11 DIAGNOSIS — Z23 Encounter for immunization: Secondary | ICD-10-CM | POA: Insufficient documentation

## 2019-04-11 NOTE — Progress Notes (Signed)
   Covid-19 Vaccination Clinic  Name:  Alex Oconnor    MRN: [id number] DOB: [birth year]  04/11/2019  [name] was observed post Covid-19 immunization for 15 minutes without incidence. He was provided with Vaccine Information Sheet and instruction to access the V-Safe system.   [name] was instructed to call 911 with any severe reactions post vaccine: Marland Kitchen Difficulty breathing  . Swelling of your face and throat  . A fast heartbeat  . A bad rash all over your body  . Dizziness and weakness    Immunizations Administered    Name Date Dose VIS Date Route   Pfizer COVID-19 Vaccine 04/11/2019 10:42 AM 0.3 mL 02/04/2019 Intramuscular   Manufacturer: [place]   Lot: [id number]   [place]: [id number]

## 2019-04-15 ENCOUNTER — Other Ambulatory Visit: Payer: Self-pay | Admitting: Gastroenterology

## 2019-04-15 DIAGNOSIS — R140 Abdominal distension (gaseous): Secondary | ICD-10-CM | POA: Diagnosis not present

## 2019-04-15 DIAGNOSIS — K449 Diaphragmatic hernia without obstruction or gangrene: Secondary | ICD-10-CM | POA: Diagnosis not present

## 2019-04-15 DIAGNOSIS — R634 Abnormal weight loss: Secondary | ICD-10-CM | POA: Diagnosis not present

## 2019-04-15 DIAGNOSIS — K219 Gastro-esophageal reflux disease without esophagitis: Secondary | ICD-10-CM | POA: Diagnosis not present

## 2019-04-26 ENCOUNTER — Ambulatory Visit
Admission: RE | Admit: 2019-04-26 | Discharge: 2019-04-26 | Disposition: A | Discharge status: Home / Self Care | Payer: Medicare Other | Source: Ambulatory Visit | Attending: Gastroenterology | Admitting: Gastroenterology

## 2019-04-26 DIAGNOSIS — R634 Abnormal weight loss: Secondary | ICD-10-CM

## 2019-04-26 DIAGNOSIS — K5730 Diverticulosis of large intestine without perforation or abscess without bleeding: Secondary | ICD-10-CM | POA: Diagnosis not present

## 2019-04-26 MED ORDER — IOPAMIDOL (ISOVUE-300) INJECTION 61%
100.0000 mL | Freq: Once | INTRAVENOUS | Status: AC | PRN
  Administered 2019-04-26: 100 mL via INTRAVENOUS

## 2019-05-09 DIAGNOSIS — Z1159 Encounter for screening for other viral diseases: Secondary | ICD-10-CM | POA: Diagnosis not present

## 2019-05-12 DIAGNOSIS — K224 Dyskinesia of esophagus: Secondary | ICD-10-CM | POA: Diagnosis not present

## 2019-05-12 DIAGNOSIS — R140 Abdominal distension (gaseous): Secondary | ICD-10-CM | POA: Diagnosis not present

## 2019-05-12 DIAGNOSIS — R634 Abnormal weight loss: Secondary | ICD-10-CM | POA: Diagnosis not present

## 2019-05-12 DIAGNOSIS — K2930 Chronic superficial gastritis without bleeding: Secondary | ICD-10-CM | POA: Diagnosis not present

## 2019-05-17 DIAGNOSIS — K2930 Chronic superficial gastritis without bleeding: Secondary | ICD-10-CM | POA: Diagnosis not present

## 2019-05-24 DIAGNOSIS — R634 Abnormal weight loss: Secondary | ICD-10-CM | POA: Diagnosis not present

## 2019-05-24 DIAGNOSIS — R140 Abdominal distension (gaseous): Secondary | ICD-10-CM | POA: Diagnosis not present

## 2019-05-24 DIAGNOSIS — K219 Gastro-esophageal reflux disease without esophagitis: Secondary | ICD-10-CM | POA: Diagnosis not present

## 2019-06-02 DIAGNOSIS — L821 Other seborrheic keratosis: Secondary | ICD-10-CM | POA: Diagnosis not present

## 2019-06-02 DIAGNOSIS — Z85828 Personal history of other malignant neoplasm of skin: Secondary | ICD-10-CM | POA: Diagnosis not present

## 2019-06-02 DIAGNOSIS — L812 Freckles: Secondary | ICD-10-CM | POA: Diagnosis not present

## 2019-06-02 DIAGNOSIS — D1801 Hemangioma of skin and subcutaneous tissue: Secondary | ICD-10-CM | POA: Diagnosis not present

## 2019-06-18 ENCOUNTER — Emergency Department (HOSPITAL_COMMUNITY)
Admission: EM | Admit: 2019-06-18 | Discharge: 2019-06-18 | Disposition: A | Discharge status: Home / Self Care | Payer: Medicare Other | Attending: Emergency Medicine | Admitting: Emergency Medicine

## 2019-06-18 ENCOUNTER — Emergency Department (HOSPITAL_COMMUNITY): Payer: Medicare Other

## 2019-06-18 ENCOUNTER — Encounter (HOSPITAL_COMMUNITY): Payer: Self-pay | Admitting: *Deleted

## 2019-06-18 DIAGNOSIS — R197 Diarrhea, unspecified: Secondary | ICD-10-CM | POA: Diagnosis not present

## 2019-06-18 DIAGNOSIS — Y9389 Activity, other specified: Secondary | ICD-10-CM | POA: Diagnosis not present

## 2019-06-18 DIAGNOSIS — I10 Essential (primary) hypertension: Secondary | ICD-10-CM | POA: Insufficient documentation

## 2019-06-18 DIAGNOSIS — I2510 Atherosclerotic heart disease of native coronary artery without angina pectoris: Secondary | ICD-10-CM | POA: Diagnosis not present

## 2019-06-18 DIAGNOSIS — R55 Syncope and collapse: Secondary | ICD-10-CM | POA: Diagnosis not present

## 2019-06-18 DIAGNOSIS — R4020 Unspecified coma: Secondary | ICD-10-CM | POA: Diagnosis not present

## 2019-06-18 DIAGNOSIS — Z87891 Personal history of nicotine dependence: Secondary | ICD-10-CM | POA: Diagnosis not present

## 2019-06-18 DIAGNOSIS — W1830XA Fall on same level, unspecified, initial encounter: Secondary | ICD-10-CM | POA: Insufficient documentation

## 2019-06-18 DIAGNOSIS — R220 Localized swelling, mass and lump, head: Secondary | ICD-10-CM | POA: Diagnosis not present

## 2019-06-18 DIAGNOSIS — Z79899 Other long term (current) drug therapy: Secondary | ICD-10-CM | POA: Insufficient documentation

## 2019-06-18 DIAGNOSIS — R519 Headache, unspecified: Secondary | ICD-10-CM | POA: Diagnosis not present

## 2019-06-18 DIAGNOSIS — R0902 Hypoxemia: Secondary | ICD-10-CM | POA: Diagnosis not present

## 2019-06-18 DIAGNOSIS — S0990XA Unspecified injury of head, initial encounter: Secondary | ICD-10-CM | POA: Diagnosis not present

## 2019-06-18 DIAGNOSIS — R112 Nausea with vomiting, unspecified: Secondary | ICD-10-CM | POA: Diagnosis not present

## 2019-06-18 DIAGNOSIS — Y999 Unspecified external cause status: Secondary | ICD-10-CM | POA: Insufficient documentation

## 2019-06-18 DIAGNOSIS — Y92012 Bathroom of single-family (private) house as the place of occurrence of the external cause: Secondary | ICD-10-CM | POA: Insufficient documentation

## 2019-06-18 DIAGNOSIS — R110 Nausea: Secondary | ICD-10-CM | POA: Diagnosis not present

## 2019-06-18 DIAGNOSIS — K5730 Diverticulosis of large intestine without perforation or abscess without bleeding: Secondary | ICD-10-CM | POA: Diagnosis not present

## 2019-06-18 LAB — CBC WITH DIFFERENTIAL/PLATELET
Abs Immature Granulocytes: 0.08 10*3/uL — ABNORMAL HIGH (ref 0.00–0.07)
Basophils Absolute: 0 10*3/uL (ref 0.0–0.1)
Basophils Relative: 0 %
Eosinophils Absolute: 0 10*3/uL (ref 0.0–0.5)
Eosinophils Relative: 0 %
HCT: 46.1 % (ref 39.0–52.0)
Hemoglobin: 15.2 g/dL (ref 13.0–17.0)
Immature Granulocytes: 1 %
Lymphocytes Relative: 4 %
Lymphs Abs: 0.6 10*3/uL — ABNORMAL LOW (ref 0.7–4.0)
MCH: 30.8 pg (ref 26.0–34.0)
MCHC: 33 g/dL (ref 30.0–36.0)
MCV: 93.3 fL (ref 80.0–100.0)
Monocytes Absolute: 0.5 10*3/uL (ref 0.1–1.0)
Monocytes Relative: 3 %
Neutro Abs: 12.9 10*3/uL — ABNORMAL HIGH (ref 1.7–7.7)
Neutrophils Relative %: 92 %
Platelets: 211 10*3/uL (ref 150–400)
RBC: 4.94 MIL/uL (ref 4.22–5.81)
RDW: 13.2 % (ref 11.5–15.5)
WBC: 14.1 10*3/uL — ABNORMAL HIGH (ref 4.0–10.5)
nRBC: 0 % (ref 0.0–0.2)

## 2019-06-18 LAB — HEPATIC FUNCTION PANEL
ALT: 28 U/L (ref 0–44)
AST: 35 U/L (ref 15–41)
Albumin: 3.7 g/dL (ref 3.5–5.0)
Alkaline Phosphatase: 62 U/L (ref 38–126)
Bilirubin, Direct: 0.2 mg/dL (ref 0.0–0.2)
Indirect Bilirubin: 0.8 mg/dL (ref 0.3–0.9)
Total Bilirubin: 1 mg/dL (ref 0.3–1.2)
Total Protein: 6.8 g/dL (ref 6.5–8.1)

## 2019-06-18 LAB — I-STAT CHEM 8, ED
BUN: 21 mg/dL (ref 8–23)
Calcium, Ion: 1.16 mmol/L (ref 1.15–1.40)
Chloride: 104 mmol/L (ref 98–111)
Creatinine, Ser: 1.1 mg/dL (ref 0.61–1.24)
Glucose, Bld: 119 mg/dL — ABNORMAL HIGH (ref 70–99)
HCT: 45 % (ref 39.0–52.0)
Hemoglobin: 15.3 g/dL (ref 13.0–17.0)
Potassium: 3.9 mmol/L (ref 3.5–5.1)
Sodium: 142 mmol/L (ref 135–145)
TCO2: 26 mmol/L (ref 22–32)

## 2019-06-18 MED ORDER — SODIUM CHLORIDE 0.9 % IV BOLUS
500.0000 mL | Freq: Once | INTRAVENOUS | Status: AC
  Administered 2019-06-18: 500 mL via INTRAVENOUS

## 2019-06-18 MED ORDER — ONDANSETRON 8 MG PO TBDP
ORAL_TABLET | ORAL | 0 refills | Status: DC
Start: 2019-06-18 — End: 2020-05-01

## 2019-06-18 NOTE — ED Notes (Signed)
Izora Gala, wife, [phone] would like an update when available

## 2019-06-18 NOTE — ED Provider Notes (Signed)
[facility] EMERGENCY DEPARTMENT Provider Note   CSN: [id number] Arrival date & time: 06/18/19  0146     History Chief Complaint  Patient presents with  . Nausea  . Emesis  . Diarrhea    Alex Oconnor is a 86 y.o. male.  The history is provided by the patient.  Emesis Severity:  Severe Timing:  Intermittent Quality:  Stomach contents Progression:  Unchanged Chronicity:  New Context: not post-tussive   Relieved by:  Nothing Worsened by:  Nothing Ineffective treatments:  None tried Associated symptoms: diarrhea   Associated symptoms: no abdominal pain   Risk factors: sick contacts   Risk factors: no alcohol use   Diarrhea Quality:  Watery Severity:  Severe Onset quality:  Sudden Timing:  Intermittent Progression:  Unchanged Associated symptoms: vomiting   Associated symptoms: no abdominal pain and no recent cough   Risk factors: suspect food intake   Patient and wife ate a [employer] and ate eggs and hash browns and both developed      Past Medical History:  Diagnosis Date  . Arthritis   . Asthma   . Cataract of both eyes   . Complication of anesthesia    pts wife states BP drops; also had bout with pulse postop   . Coronary artery disease    stent placed 1999  . Dysrhythmia   . Gastroparesis   . GERD (gastroesophageal reflux disease)   . Hard of hearing   . Headache(784.0)    saw in ED 08/2006  . History of hiatal hernia   . Hyperlipemia   . Hypertension   . Irritable bowel syndrome   . Numbness    left arm pts wife states MD aware and no issues identified  . Rotator cuff disorder    left   . Sleep apnea    10/2001; does not use CPAP  . TIA (transient ischemic attack)    11/2011  . Urinary hesitancy   . Wears glasses     Patient Active Problem List   Diagnosis Date Noted  . Angina, class III ([place]) 08/23/2014  . Lap Repair of large type III hiatus hernia with Nissen fundoplication over a 0000000 bougie Oct 2015 12/08/2013    . Hiatus hernia syndrome 12/08/2013  . 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 TIA (transient ischemic attack) 12/06/2011  . Transient global amnesia 12/06/2011  . Bacterial overgrowth syndrome 12/06/2011  . Hypertension 12/06/2011  . H/O carotid artery stenosis 12/06/2011  . CAD (coronary artery disease) 12/06/2011    Past Surgical History:  Procedure Laterality Date  . abd ultrasound      03/2007-xray of abd;01/2008-ultrasound of abd;01/2009-ultrasound of abd  . BACK SURGERY     MRI lower back 09/2010;back injected 10/2010;11/2010 spinal lumbar fusion L4-5  . CARDIAC CATHETERIZATION     May 1999  . CARDIAC CATHETERIZATION N/A 08/23/2014   Procedure: Left Heart Cath and Coronary Angiography;  Surgeon: Belva Crome, MD;  Location: [facility];  Service: Cardiovascular;  Laterality: N/A;  . CARDIOVASCULAR STRESS TEST     10/1999;09/2002  . CAROTID ENDARTERECTOMY     right side 1997  . COLONOSCOPY    . colonscopy     02/2007  . CORONARY STENT PLACEMENT    . eyelid surgery     removal of ingrown eyelashes  . GREEN LIGHT LASER TURP (TRANSURETHRAL RESECTION OF PROSTATE N/A 06/19/2015   Procedure: GREEN LIGHT LASER  TURP (TRANSURETHRAL RESECTION OF PROSTATE;  Surgeon: Irine Seal, MD;  Location: [facility];  Service: Urology;  Laterality: N/A;  . HERNIA REPAIR     2001; inguinal - right   . HIATAL HERNIA REPAIR N/A 12/08/2013   Procedure: LAPAROSCOPIC REPAIR OF HIATAL HERNIA;  Surgeon: Pedro Earls, MD;  Location: [facility];  Service: General;  Laterality: N/A;  . injection of knees     06/2012 both knees injected;10/2013 both knees injected   . INSERTION OF MESH N/A 12/08/2013   Procedure: INSERTION OF MESH;  Surgeon: Pedro Earls, MD;  Location: [facility];  Service: General;  Laterality: N/A;  . KNEE ARTHROSCOPY     right knee 06/2002;12/2005-left knee  . right hip injected     08/2013  . TONSILLECTOMY         Family History  Problem  Relation Age of Onset  . Congestive Heart Failure Brother     Social History   Tobacco Use  . Smoking status: Former Smoker    Packs/day: 2.00    Years: 20.00    Pack years: 40.00    Types: Cigarettes    Quit date: 11/24/1980    Years since quitting: 38.5  . Smokeless tobacco: Never Used  Substance Use Topics  . Alcohol use: Yes    Comment: past alcohol abuse quit 30 years ago   . Drug use: No    Home Medications Prior to Admission medications   Medication Sig Start Date End Date Taking? Authorizing Provider  acetaminophen (TYLENOL) 500 MG tablet Take 500-1,000 mg by mouth every 6 (six) hours as needed for headache.    [provider]  albuterol (PROVENTIL HFA;VENTOLIN HFA) 108 (90 BASE) MCG/ACT inhaler Inhale 1 puff into the lungs every 4 (four) hours as needed for wheezing or shortness of breath.    [provider]  aspirin 81 MG tablet Take 81 mg by mouth daily. Pt was taking daily stopped taking two days ago (11/16/2013)    [provider]  benazepril (LOTENSIN) 10 MG tablet Take 10 mg by mouth daily. 07/25/14   [provider]  cholecalciferol (VITAMIN D) 1000 UNITS tablet Take 1,000 Units by mouth daily.    [provider]  fexofenadine (ALLEGRA) 180 MG tablet Take 180 mg by mouth daily as needed for allergies.     [provider]  fluticasone (FLONASE) 50 MCG/ACT nasal spray Place 1 spray into both nostrils daily as needed for allergies or rhinitis.    [provider]  LORazepam (ATIVAN) 1 MG tablet Take 0.5 mg by mouth at bedtime.    [provider]  naphazoline-glycerin (CLEAR EYES) 0.012-0.2 % SOLN Place 1-2 drops into both eyes every 4 (four) hours as needed for irritation.    [provider]  naproxen sodium (ANAPROX) 220 MG tablet Take 220-440 mg by mouth daily as needed (pain.).    [provider]  NITROSTAT 0.4 MG SL tablet Take 0.4 mg by mouth as needed. For chest pain 07/12/14    [provider]  Probiotic Product (PROBIOTIC PO) Take 1 tablet by mouth daily.    [provider]  saw palmetto 500 MG capsule Take 500 mg by mouth every morning.     [provider]  simvastatin (ZOCOR) 20 MG tablet Take 20 mg by mouth every morning.     [provider]  traMADol-acetaminophen (ULTRACET) 37.5-325 MG tablet Take 1 tablet by mouth every 6 (six) hours as needed. 06/19/15  Irine Seal, MD    Allergies    Penicillins and Plavix [clopidogrel bisulfate]  Review of Systems   Review of Systems  Gastrointestinal: Positive for diarrhea and vomiting. Negative for abdominal pain.    Physical Exam Updated Vital Signs BP (!) 150/94   Pulse 72   Temp (!) 96.3 F (35.7 C)   Resp (!) 22   SpO2 96%   Physical Exam  ED Results / Procedures / Treatments   Labs (all labs ordered are listed, but only abnormal results are displayed) Results for orders placed or performed during the hospital encounter of 06/18/19  CBC with Differential/Platelet  Result Value Ref Range   WBC 14.1 (H) 4.0 - 10.5 K/uL   RBC 4.94 4.22 - 5.81 MIL/uL   Hemoglobin 15.2 13.0 - 17.0 g/dL   HCT 46.1 39.0 - 52.0 %   MCV 93.3 80.0 - 100.0 fL   MCH 30.8 26.0 - 34.0 pg   MCHC 33.0 30.0 - 36.0 g/dL   RDW 13.2 11.5 - 15.5 %   Platelets 211 150 - 400 K/uL   nRBC 0.0 0.0 - 0.2 %   Neutrophils Relative % 92 %   Neutro Abs 12.9 (H) 1.7 - 7.7 K/uL   Lymphocytes Relative 4 %   Lymphs Abs 0.6 (L) 0.7 - 4.0 K/uL   Monocytes Relative 3 %   Monocytes Absolute 0.5 0.1 - 1.0 K/uL   Eosinophils Relative 0 %   Eosinophils Absolute 0.0 0.0 - 0.5 K/uL   Basophils Relative 0 %   Basophils Absolute 0.0 0.0 - 0.1 K/uL   Immature Granulocytes 1 %   Abs Immature Granulocytes 0.08 (H) 0.00 - 0.07 K/uL  Hepatic function panel  Result Value Ref Range   Total Protein 6.8 6.5 - 8.1 g/dL   Albumin 3.7 3.5 - 5.0 g/dL   AST 35 15 - 41 U/L   ALT 28 0 - 44 U/L   Alkaline Phosphatase 62 38 -  126 U/L   Total Bilirubin 1.0 0.3 - 1.2 mg/dL   Bilirubin, Direct 0.2 0.0 - 0.2 mg/dL   Indirect Bilirubin 0.8 0.3 - 0.9 mg/dL  I-stat chem 8, ED (not at [facility] or [facility])  Result Value Ref Range   Sodium 142 135 - 145 mmol/L   Potassium 3.9 3.5 - 5.1 mmol/L   Chloride 104 98 - 111 mmol/L   BUN 21 8 - 23 mg/dL   Creatinine, Ser 1.10 0.61 - 1.24 mg/dL   Glucose, Bld 119 (H) 70 - 99 mg/dL   Calcium, Ion 1.16 1.15 - 1.40 mmol/L   TCO2 26 22 - 32 mmol/L   Hemoglobin 15.3 13.0 - 17.0 g/dL   HCT 45.0 39.0 - 52.0 %   CT Head Wo Contrast  Result Date: 06/18/2019 CLINICAL DATA:  86 year old male with history of trauma from a fall. Syncopal episode. Nausea and vomiting. EXAM: CT HEAD WITHOUT CONTRAST TECHNIQUE: Contiguous axial images were obtained from the base of the skull through the vertex without intravenous contrast. COMPARISON:  Head CT 10/08/2018. FINDINGS: Brain: Mild cerebral atrophy. Patchy and confluent areas of decreased attenuation are noted throughout the deep and periventricular white matter of the cerebral hemispheres bilaterally, compatible with chronic microvascular ischemic disease. No evidence of acute infarction, hemorrhage, hydrocephalus, extra-axial collection or mass lesion/mass effect. Vascular: No hyperdense vessel or unexpected calcification. Skull: Normal. Negative for fracture or focal lesion. Sinuses/Orbits: No acute finding. Tiny mucosal retention cyst or polyp in the lateral aspect of the right maxillary  sinus incidentally noted. Other: A small amount of soft tissue swelling in the left parietooccipital scalp. IMPRESSION: 1. Small amount of soft tissue swelling in the left parietooccipital scalp. No underlying displaced skull fractures or signs of significant acute traumatic injury to the brain. 2. Mild cerebral atrophy with chronic microvascular ischemic changes in the cerebral white matter, as above. Electronically Signed   By: Vinnie Langton M.D.   On: 06/18/2019 07:24    CT Renal Stone Study  Result Date: 06/18/2019 CLINICAL DATA:  86 year old male with history of nausea and vomiting. History of fall. EXAM: CT ABDOMEN AND PELVIS WITHOUT CONTRAST TECHNIQUE: Multidetector CT imaging of the abdomen and pelvis was performed following the standard protocol without IV contrast. COMPARISON:  CT the abdomen and pelvis 04/26/2019. FINDINGS: Lower chest: Aortic atherosclerosis. Atherosclerotic calcifications in the left anterior descending and right coronary arteries. Surgical clips near the gastroesophageal junction. Moderate-sized hiatal hernia. Hepatobiliary: Low-attenuation lesions in the liver measuring up to 1.7 cm in the periphery of segment 8, incompletely characterized on today's non-contrast CT examination, but similar to the prior study and previously characterized as simple cysts. No other suspicious hepatic lesions are confidently identified on today's noncontrast CT examination. Unenhanced appearance of the gallbladder is normal. Pancreas: No definite pancreatic mass or peripancreatic fluid collections or inflammatory changes are identified on today's noncontrast CT examination. Spleen: Unremarkable. Adrenals/Urinary Tract: There are no abnormal calcifications within the collecting system of either kidney, along the course of either ureter, or within the lumen of the urinary bladder. No hydroureteronephrosis or perinephric stranding to suggest urinary tract obstruction at this time. Multiple low-attenuation lesions in both kidneys, incompletely characterized on today's non-contrast CT examination, but similar to the prior study and previously characterized as simple cysts, largest of which is a 5.2 cm lesion associated with the right renal pelvis, compatible with a large parapelvic cyst. Bilateral adrenal glands are normal in appearance. Unenhanced appearance of the urinary bladder is normal. Stomach/Bowel: Intra-abdominal portion of the stomach is unremarkable. No  pathologic dilatation of small bowel or colon. Numerous colonic diverticulae are noted, most evident throughout the sigmoid colon, without surrounding inflammatory changes to suggest an acute diverticulitis at this time. Normal appendix. Vascular/Lymphatic: Aortic atherosclerosis. No lymphadenopathy noted in the abdomen or pelvis. Reproductive: Prostate gland and seminal vesicles are unremarkable in appearance. Other: No significant volume of ascites.  No pneumoperitoneum. Musculoskeletal: Status post PLIF at L4-L5 with interbody grafts at L4-L5 interspace. There are no aggressive appearing lytic or blastic lesions noted in the visualized portions of the skeleton. IMPRESSION: 1. No acute findings are noted in the abdomen or pelvis. 2. Severe colonic diverticulosis without evidence of acute diverticulitis at this time. 3. Aortic atherosclerosis, in addition to least 2 vessel coronary artery disease. 4. Moderate-sized hiatal hernia. 5. Additional incidental findings, as above. Electronically Signed   By: Vinnie Langton M.D.   On: 06/18/2019 07:29   ' Procedures Procedures (including critical care time)  Medications Ordered in ED Medications  sodium chloride 0.9 % bolus 500 mL (500 mLs Intravenous New Bag/Given 06/18/19 0215)    ED Course  I have reviewed the triage vital signs and the nursing notes.  Pertinent labs & imaging results that were available during my care of the patient were reviewed by me and considered in my medical decision making (see chart for details).    Patient is not orthostatic.  PO challenged successfully. Both patient and the wife are sick after eating at [facility] this is [employer]  poisoning.  Awaiting CTs, if negative can be discharged.    Alex Oconnor was evaluated in Emergency Department on 06/18/2019 for the symptoms described in the history of present illness. He was evaluated in the context of the global COVID-19 pandemic, which necessitated consideration  that the patient might be at risk for infection with the SARS-CoV-2 virus that causes COVID-19. Institutional protocols and algorithms that pertain to the evaluation of patients at risk for COVID-19 are in a state of rapid change based on information released by regulatory bodies including the CDC and federal and state organizations. These policies and algorithms were followed during the patient's care in the ED.  Final Clinical Impression(s) / ED Diagnoses Return for weakness, numbness, changes in vision or speech, fevers >100.4 unrelieved by medication, shortness of breath, intractable vomiting, or diarrhea, abdominal pain, Inability to tolerate liquids or food, cough, altered mental status or any concerns. No signs of systemic illness or infection. The patient is nontoxic-appearing on exam and vital signs are within normal limits.   I have reviewed the triage vital signs and the nursing notes. Pertinent labs &imaging results that were available during my care of the patient were reviewed by me and considered in my medical decision making (see chart for details).  After history, exam, and medical workup I feel the patient has been appropriately medically screened and is safe for discharge home. Pertinent diagnoses were discussed with the patient. Patient was givenstrictreturn precautions.    Merida Alcantar, MD 06/18/19 [phone]

## 2019-06-18 NOTE — ED Triage Notes (Signed)
Pt from home by EMS for NVD and near syncope while using the restroom. Hx of gastroparesis. Per wife, they had [employer] for dinner and had cooked potatoes and cheese omelet that seemed to have more grease than usual. Wife heard pt fall in the bathroom, hematoma to posterior head per EMS. EMS gave zofran enroute with improvement. Pt c/o headache

## 2019-07-05 DIAGNOSIS — R110 Nausea: Secondary | ICD-10-CM | POA: Diagnosis not present

## 2019-07-05 DIAGNOSIS — K6389 Other specified diseases of intestine: Secondary | ICD-10-CM | POA: Diagnosis not present

## 2019-07-05 DIAGNOSIS — R109 Unspecified abdominal pain: Secondary | ICD-10-CM | POA: Diagnosis not present

## 2019-07-05 DIAGNOSIS — R140 Abdominal distension (gaseous): Secondary | ICD-10-CM | POA: Diagnosis not present

## 2019-07-20 DIAGNOSIS — M25561 Pain in right knee: Secondary | ICD-10-CM | POA: Diagnosis not present

## 2019-07-20 DIAGNOSIS — M25552 Pain in left hip: Secondary | ICD-10-CM | POA: Diagnosis not present

## 2019-09-08 DIAGNOSIS — M1711 Unilateral primary osteoarthritis, right knee: Secondary | ICD-10-CM | POA: Diagnosis not present

## 2019-09-08 DIAGNOSIS — M1712 Unilateral primary osteoarthritis, left knee: Secondary | ICD-10-CM | POA: Diagnosis not present

## 2019-09-08 DIAGNOSIS — M25561 Pain in right knee: Secondary | ICD-10-CM | POA: Diagnosis not present

## 2019-09-08 DIAGNOSIS — M170 Bilateral primary osteoarthritis of knee: Secondary | ICD-10-CM | POA: Diagnosis not present

## 2019-09-08 DIAGNOSIS — M25552 Pain in left hip: Secondary | ICD-10-CM | POA: Diagnosis not present

## 2019-09-15 DIAGNOSIS — K219 Gastro-esophageal reflux disease without esophagitis: Secondary | ICD-10-CM | POA: Diagnosis not present

## 2019-09-15 DIAGNOSIS — I10 Essential (primary) hypertension: Secondary | ICD-10-CM | POA: Diagnosis not present

## 2019-09-15 DIAGNOSIS — K589 Irritable bowel syndrome without diarrhea: Secondary | ICD-10-CM | POA: Diagnosis not present

## 2019-09-15 DIAGNOSIS — F411 Generalized anxiety disorder: Secondary | ICD-10-CM | POA: Diagnosis not present

## 2019-09-15 DIAGNOSIS — J4520 Mild intermittent asthma, uncomplicated: Secondary | ICD-10-CM | POA: Diagnosis not present

## 2019-09-29 DIAGNOSIS — R5383 Other fatigue: Secondary | ICD-10-CM | POA: Diagnosis not present

## 2019-09-29 DIAGNOSIS — R05 Cough: Secondary | ICD-10-CM | POA: Diagnosis not present

## 2019-09-30 DIAGNOSIS — R5383 Other fatigue: Secondary | ICD-10-CM | POA: Diagnosis not present

## 2019-09-30 DIAGNOSIS — Z03818 Encounter for observation for suspected exposure to other biological agents ruled out: Secondary | ICD-10-CM | POA: Diagnosis not present

## 2019-10-07 DIAGNOSIS — M25561 Pain in right knee: Secondary | ICD-10-CM | POA: Diagnosis not present

## 2019-10-21 DIAGNOSIS — M13841 Other specified arthritis, right hand: Secondary | ICD-10-CM | POA: Diagnosis not present

## 2019-10-27 DIAGNOSIS — R140 Abdominal distension (gaseous): Secondary | ICD-10-CM | POA: Diagnosis not present

## 2019-11-22 DIAGNOSIS — M25551 Pain in right hip: Secondary | ICD-10-CM | POA: Diagnosis not present

## 2019-11-22 DIAGNOSIS — M25552 Pain in left hip: Secondary | ICD-10-CM | POA: Diagnosis not present

## 2019-11-25 DIAGNOSIS — Z23 Encounter for immunization: Secondary | ICD-10-CM | POA: Diagnosis not present

## 2019-12-07 DIAGNOSIS — L821 Other seborrheic keratosis: Secondary | ICD-10-CM | POA: Diagnosis not present

## 2019-12-07 DIAGNOSIS — D1801 Hemangioma of skin and subcutaneous tissue: Secondary | ICD-10-CM | POA: Diagnosis not present

## 2019-12-07 DIAGNOSIS — L820 Inflamed seborrheic keratosis: Secondary | ICD-10-CM | POA: Diagnosis not present

## 2019-12-07 DIAGNOSIS — Z85828 Personal history of other malignant neoplasm of skin: Secondary | ICD-10-CM | POA: Diagnosis not present

## 2019-12-07 DIAGNOSIS — L812 Freckles: Secondary | ICD-10-CM | POA: Diagnosis not present

## 2019-12-08 DIAGNOSIS — M7061 Trochanteric bursitis, right hip: Secondary | ICD-10-CM | POA: Diagnosis not present

## 2019-12-08 DIAGNOSIS — M25552 Pain in left hip: Secondary | ICD-10-CM | POA: Diagnosis not present

## 2019-12-08 DIAGNOSIS — M25551 Pain in right hip: Secondary | ICD-10-CM | POA: Diagnosis not present

## 2019-12-16 DIAGNOSIS — M25552 Pain in left hip: Secondary | ICD-10-CM | POA: Diagnosis not present

## 2019-12-19 DIAGNOSIS — G8929 Other chronic pain: Secondary | ICD-10-CM | POA: Diagnosis not present

## 2019-12-19 DIAGNOSIS — M5416 Radiculopathy, lumbar region: Secondary | ICD-10-CM | POA: Diagnosis not present

## 2019-12-19 DIAGNOSIS — I10 Essential (primary) hypertension: Secondary | ICD-10-CM | POA: Diagnosis not present

## 2019-12-19 DIAGNOSIS — M47816 Spondylosis without myelopathy or radiculopathy, lumbar region: Secondary | ICD-10-CM | POA: Diagnosis not present

## 2019-12-19 DIAGNOSIS — M5441 Lumbago with sciatica, right side: Secondary | ICD-10-CM | POA: Diagnosis not present

## 2019-12-19 DIAGNOSIS — Z981 Arthrodesis status: Secondary | ICD-10-CM | POA: Diagnosis not present

## 2019-12-22 DIAGNOSIS — M25551 Pain in right hip: Secondary | ICD-10-CM | POA: Diagnosis not present

## 2019-12-22 DIAGNOSIS — R7303 Prediabetes: Secondary | ICD-10-CM | POA: Diagnosis not present

## 2019-12-22 DIAGNOSIS — I471 Supraventricular tachycardia: Secondary | ICD-10-CM | POA: Diagnosis not present

## 2019-12-22 DIAGNOSIS — M25552 Pain in left hip: Secondary | ICD-10-CM | POA: Diagnosis not present

## 2019-12-22 DIAGNOSIS — M5431 Sciatica, right side: Secondary | ICD-10-CM | POA: Diagnosis not present

## 2019-12-22 DIAGNOSIS — R0789 Other chest pain: Secondary | ICD-10-CM | POA: Diagnosis not present

## 2020-01-04 DIAGNOSIS — M545 Low back pain, unspecified: Secondary | ICD-10-CM | POA: Diagnosis not present

## 2020-01-04 DIAGNOSIS — M5441 Lumbago with sciatica, right side: Secondary | ICD-10-CM | POA: Diagnosis not present

## 2020-01-09 ENCOUNTER — Other Ambulatory Visit (HOSPITAL_BASED_OUTPATIENT_CLINIC_OR_DEPARTMENT_OTHER): Payer: Self-pay | Admitting: Internal Medicine

## 2020-01-09 ENCOUNTER — Ambulatory Visit: Payer: Medicare Other | Attending: Internal Medicine

## 2020-01-09 DIAGNOSIS — Z23 Encounter for immunization: Secondary | ICD-10-CM

## 2020-01-09 MED FILL — PFIZER-BIONTECH COVID-19 VA: 30 | 1 days supply | Qty: 0 | Fill #0

## 2020-01-09 NOTE — Progress Notes (Signed)
° °  Covid-19 Vaccination Clinic  Name:  Alex Oconnor    MRN: [id number] DOB: 27-Jan-1933  01/09/2020  Alex Oconnor was observed post Covid-19 immunization for 15 minutes without incident. He was provided with Vaccine Information Sheet and instruction to access the V-Safe system.   Alex Oconnor was instructed to call 911 with any severe reactions post vaccine:  Difficulty breathing   Swelling of face and throat   A fast heartbeat   A bad rash all over body   Dizziness and weakness   Immunizations Administered    Name Date Dose VIS Date Route   Pfizer COVID-19 Vaccine 01/09/2020 10:45 AM 0.3 mL 12/14/2019 Intramuscular   Manufacturer: [place]   Lot: [id number]   NDC: [address]-1

## 2020-01-23 DIAGNOSIS — M48061 Spinal stenosis, lumbar region without neurogenic claudication: Secondary | ICD-10-CM | POA: Diagnosis not present

## 2020-01-23 DIAGNOSIS — M5441 Lumbago with sciatica, right side: Secondary | ICD-10-CM | POA: Diagnosis not present

## 2020-01-23 DIAGNOSIS — M47816 Spondylosis without myelopathy or radiculopathy, lumbar region: Secondary | ICD-10-CM | POA: Diagnosis not present

## 2020-01-23 DIAGNOSIS — M5416 Radiculopathy, lumbar region: Secondary | ICD-10-CM | POA: Diagnosis not present

## 2020-01-30 DIAGNOSIS — M5416 Radiculopathy, lumbar region: Secondary | ICD-10-CM | POA: Diagnosis not present

## 2020-03-27 DIAGNOSIS — K219 Gastro-esophageal reflux disease without esophagitis: Secondary | ICD-10-CM | POA: Diagnosis not present

## 2020-03-27 DIAGNOSIS — F411 Generalized anxiety disorder: Secondary | ICD-10-CM | POA: Diagnosis not present

## 2020-03-27 DIAGNOSIS — R2681 Unsteadiness on feet: Secondary | ICD-10-CM | POA: Diagnosis not present

## 2020-03-27 DIAGNOSIS — I2510 Atherosclerotic heart disease of native coronary artery without angina pectoris: Secondary | ICD-10-CM | POA: Diagnosis not present

## 2020-03-27 DIAGNOSIS — Z7189 Other specified counseling: Secondary | ICD-10-CM | POA: Diagnosis not present

## 2020-03-27 DIAGNOSIS — Z0000 Encounter for general adult medical examination without abnormal findings: Secondary | ICD-10-CM | POA: Diagnosis not present

## 2020-03-27 DIAGNOSIS — Z1389 Encounter for screening for other disorder: Secondary | ICD-10-CM | POA: Diagnosis not present

## 2020-03-27 DIAGNOSIS — R7301 Impaired fasting glucose: Secondary | ICD-10-CM | POA: Diagnosis not present

## 2020-03-27 DIAGNOSIS — K589 Irritable bowel syndrome without diarrhea: Secondary | ICD-10-CM | POA: Diagnosis not present

## 2020-03-27 DIAGNOSIS — J4520 Mild intermittent asthma, uncomplicated: Secondary | ICD-10-CM | POA: Diagnosis not present

## 2020-03-27 DIAGNOSIS — E782 Mixed hyperlipidemia: Secondary | ICD-10-CM | POA: Diagnosis not present

## 2020-03-27 DIAGNOSIS — H8111 Benign paroxysmal vertigo, right ear: Secondary | ICD-10-CM | POA: Diagnosis not present

## 2020-03-27 DIAGNOSIS — I10 Essential (primary) hypertension: Secondary | ICD-10-CM | POA: Diagnosis not present

## 2020-03-28 DIAGNOSIS — M5416 Radiculopathy, lumbar region: Secondary | ICD-10-CM | POA: Diagnosis not present

## 2020-03-28 DIAGNOSIS — Z981 Arthrodesis status: Secondary | ICD-10-CM | POA: Diagnosis not present

## 2020-03-28 DIAGNOSIS — M47816 Spondylosis without myelopathy or radiculopathy, lumbar region: Secondary | ICD-10-CM | POA: Diagnosis not present

## 2020-03-28 DIAGNOSIS — M48061 Spinal stenosis, lumbar region without neurogenic claudication: Secondary | ICD-10-CM | POA: Diagnosis not present

## 2020-03-28 DIAGNOSIS — M5441 Lumbago with sciatica, right side: Secondary | ICD-10-CM | POA: Diagnosis not present

## 2020-03-28 DIAGNOSIS — G8929 Other chronic pain: Secondary | ICD-10-CM | POA: Diagnosis not present

## 2020-04-17 DIAGNOSIS — R0789 Other chest pain: Secondary | ICD-10-CM | POA: Diagnosis not present

## 2020-04-17 DIAGNOSIS — R0600 Dyspnea, unspecified: Secondary | ICD-10-CM | POA: Diagnosis not present

## 2020-04-18 NOTE — Progress Notes (Unsigned)
Cardiology Office Note:   Date:  04/19/2020  NAME:  Alex Oconnor    MRN: [id number] DOB:  [birth year]   PCP:  Lavone Orn, MD  Cardiologist:  No primary care provider on file.  Electrophysiologist:  None   Referring MD: Lavone Orn, MD   Chief Complaint  Patient presents with  . New Patient (Initial Visit)  . Chest Pain    Tightness.  . Shortness of Breath    History of Present Illness:   Alex Oconnor is a 87 y.o. male with a hx of CAD, HTN, HLD, TIA who is being seen today for the evaluation of chest pain at the request of Lavone Orn, MD.  He presents with his wife.  For the past 2 weeks he has had worsening chest pain or shortness of breath.  He also describes fatigue.  He reports intermittent chest pressure in the right side of his chest.  He also describes a fluttering in his chest.  Occurs in the right side.  This occurs with exertion.  Symptoms improved with rest.  He reports no energy.  Apparently before 2 weeks ago he was walking up to 2 miles with his wife without any major limitations.  This is a new change for him.  He reports he had similar symptoms prior to his stents that were put in a number of years ago.  He was evaluated in 2016 for atypical pain underwent left heart cath.  Found to have nonobstructive CAD.  His blood pressure is 108/70.  He is not diabetic.  Most recent LDL cholesterol 103.  He has had a TIA in the past as well.  His wife does report that there is some cognitive decline.  He is also losing weight.  He reports he is very concerned about his heart.  His EKG in office demonstrates sinus bradycardia heart rate 52.  Recent TSH from his primary care physician 1.48.  Symptoms are bothersome.  He is concerned about this.  He did have a stress test as part of a research study at [facility] that was normal.    LHC 08/23/2014: 20% RCA (prior stent), normal LAD/LCX. Normal LVEDP.   Problem List 1. CAD -PCI 1999 to RCA -LHC 2016: normal LAD/LCX, 20% RCA 2.  Carotid artery disease s/p R endarterectomy  -1997 3. HTN 4. HLD -T chol 173, HDL 40, LDL 103, TG 178 5. TIA  Past Medical History: Past Medical History:  Diagnosis Date  . Arthritis   . Asthma   . Carotid artery occlusion   . Cataract of both eyes   . Complication of anesthesia    pts wife states BP drops; also had bout with pulse postop   . Coronary artery disease    stent placed 1999  . Dysrhythmia   . Gastroparesis   . GERD (gastroesophageal reflux disease)   . Hard of hearing   . Headache(784.0)    saw in ED 08/2006  . History of hiatal hernia   . Hyperlipemia   . Hypertension   . Irritable bowel syndrome   . Numbness    left arm pts wife states MD aware and no issues identified  . Rotator cuff disorder    left   . Sleep apnea    10/2001; does not use CPAP  . TIA (transient ischemic attack)    11/2011  . Urinary hesitancy   . Wears glasses     Past Surgical History: Past Surgical History:  Procedure Laterality Date  .  abd ultrasound      03/2007-xray of abd;01/2008-ultrasound of abd;01/2009-ultrasound of abd  . BACK SURGERY     MRI lower back 09/2010;back injected 10/2010;11/2010 spinal lumbar fusion L4-5  . CARDIAC CATHETERIZATION     May 1999  . CARDIAC CATHETERIZATION N/A 08/23/2014   Procedure: Left Heart Cath and Coronary Angiography;  Surgeon: Belva Crome, MD;  Location: [facility];  Service: Cardiovascular;  Laterality: N/A;  . CARDIOVASCULAR STRESS TEST     10/1999;09/2002  . CAROTID ENDARTERECTOMY     right side 1997  . COLONOSCOPY    . colonscopy     02/2007  . CORONARY STENT PLACEMENT    . eyelid surgery     removal of ingrown eyelashes  . GREEN LIGHT LASER TURP (TRANSURETHRAL RESECTION OF PROSTATE N/A 06/19/2015   Procedure: GREEN LIGHT LASER TURP (TRANSURETHRAL RESECTION OF PROSTATE;  Surgeon: Irine Seal, MD;  Location: [facility];  Service: Urology;  Laterality: N/A;  . HERNIA REPAIR     2001; inguinal - right   . HIATAL HERNIA REPAIR  N/A 12/08/2013   Procedure: LAPAROSCOPIC REPAIR OF HIATAL HERNIA;  Surgeon: Pedro Earls, MD;  Location: [facility];  Service: General;  Laterality: N/A;  . injection of knees     06/2012 both knees injected;10/2013 both knees injected   . INSERTION OF MESH N/A 12/08/2013   Procedure: INSERTION OF MESH;  Surgeon: Pedro Earls, MD;  Location: [facility];  Service: General;  Laterality: N/A;  . KNEE ARTHROSCOPY     right knee 06/2002;12/2005-left knee  . right hip injected     08/2013  . TONSILLECTOMY      Current Medications: Current Meds  Medication Sig  . aspirin 81 MG tablet Take 81 mg by mouth daily. Pt was taking daily stopped taking two days ago (11/16/2013)  . benazepril (LOTENSIN) 10 MG tablet Take 10 mg by mouth daily.  . cholecalciferol (VITAMIN D) 1000 UNITS tablet Take 1,000 Units by mouth daily.  . fluticasone (FLONASE) 50 MCG/ACT nasal spray Place 1 spray into both nostrils daily as needed for allergies or rhinitis.  Marland Kitchen LORazepam (ATIVAN) 1 MG tablet Take 0.5 mg by mouth at bedtime.  . ondansetron (ZOFRAN ODT) 8 MG disintegrating tablet 8mg  ODT q8 hours prn nausea  . Probiotic Product (PROBIOTIC PO) Take 1 tablet by mouth daily.  . saw palmetto 500 MG capsule Take 500 mg by mouth every morning.   . simvastatin (ZOCOR) 20 MG tablet Take 20 mg by mouth every morning.      Allergies:    Penicillins and Plavix [clopidogrel bisulfate]   Social History: Social History   Socioeconomic History  . Marital status: Married    Spouse name: Not on file  . Number of children: 2  . Years of education: Not on file  . Highest education level: Not on file  Occupational History  . 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 Smoking status: Former Smoker    Packs/day: 2.00    Years: 20.00    Pack years: 40.00    Types: Cigarettes    Quit date: 11/24/1980    Years since quitting: 39.4  . Smokeless tobacco: Never Used  Substance and Sexual Activity  .  Alcohol use: Yes    Comment: past alcohol abuse quit 30 years ago   . Drug use: No  . Sexual activity: Not on file  Other Topics Concern  . Not on file  Social History  Narrative  . Not on file   Social Determinants of Health   Financial Resource Strain: Not on file  Food Insecurity: Not on file  Transportation Needs: Not on file  Physical Activity: Not on file  Stress: Not on file  Social Connections: Not on file     Family History: The patient's family history includes Congestive Heart Failure in his brother.  ROS:   All other ROS reviewed and negative. Pertinent positives noted in the HPI.     EKGs/Labs/Other Studies Reviewed:   The following studies were personally reviewed by me today:  EKG:  EKG is ordered today.  The ekg ordered today demonstrates sinus bradycardia heart rate 52, no acute ischemic changes, no evidence of prior infarction for Alex Oconnor on Dr. Davina Poke , and was personally reviewed by me.   Recent Labs: 06/18/2019: ALT 28; BUN 21; Creatinine, Ser 1.10; Hemoglobin 15.3; Platelets 211; Potassium 3.9; Sodium 142   Recent Lipid Panel    Component Value Date/Time   CHOL 103 12/07/2011 0715   TRIG 147 12/07/2011 0715   HDL 25 (L) 12/07/2011 0715   CHOLHDL 4.1 12/07/2011 0715   VLDL 29 12/07/2011 0715   LDLCALC 49 12/07/2011 0715    Physical Exam:   VS:  BP 108/70 (BP Location: Left Arm, Patient Position: Sitting, Cuff Size: Normal)   Pulse (!) 52   Ht 5\' 8"  (1.727 m)   Wt 154 lb (69.9 kg)   BMI 23.42 kg/m    Wt Readings from Last 3 Encounters:  04/19/20 154 lb (69.9 kg)  06/19/15 161 lb (73 kg)  06/13/15 161 lb (73 kg)    General: Well nourished, well developed, in no acute distress Head: Atraumatic, normal size  Eyes: PEERLA, EOMI  Neck: Supple, no JVD Endocrine: No thryomegaly Cardiac: Normal S1, S2; RRR; no murmurs, rubs, or gallops Lungs: Clear to auscultation bilaterally, no wheezing, rhonchi or rales  Abd: Soft, nontender, no  hepatomegaly  Ext: No edema, pulses 2+ Musculoskeletal: No deformities, BUE and BLE strength normal and equal Skin: Warm and dry, no rashes   Neuro: Alert and oriented to person, place, time, and situation, CNII-XII grossly intact, no focal deficits  Psych: Normal mood and affect   ASSESSMENT:   Alex Oconnor is a 87 y.o. male who presents for the following: 1. Chest pain, unspecified type   2. SOB (shortness of breath)   3. Coronary artery disease involving native coronary artery of native heart without angina pectoris   4. Bradycardia     PLAN:   1. Chest pain, unspecified type 2. SOB (shortness of breath) 3. Coronary artery disease involving native coronary artery of native heart without angina pectoris 4. Bradycardia -Progressively worsening right-sided chest pain with shortness of breath and fatigue.  Rather atypical for cardiac pain.  EKG in office demonstrates sinus bradycardia heart rate 52 with no acute ischemic changes or evidence of infarction.  He had similar symptoms of progressive chest pain in 2016 and found to have a normal left heart cath.  Given his history of CAD he is high risk.  I recommended an exercise nuclear medicine stress test.  I would like to repeat his echocardiogram as well.  He has bradycardic on examination.  EKG shows heart rate in the 50s.  No agents that we will do this.  Given his fatigue we will proceed with a 7-day monitor to exclude any arrhythmias.  His most recent TSH is normal.  His wife does report that he  is not eating well due to gastroparesis.  Apparently there is some cognitive issues here.  This may be the bigger issue.  We will make sure his heart is okay first.  I will see him back in 2 months.  Disposition: Return in about 2 months (around 06/17/2020).  Medication Adjustments/Labs and Tests Ordered: Current medicines are reviewed at length with the patient today.  Concerns regarding medicines are outlined above.  Orders Placed This  Encounter  Procedures  . Cardiac Stress Test: Informed Consent Details: Physician/Practitioner Attestation; Transcribe to consent form and obtain patient signature  . MYOCARDIAL PERFUSION IMAGING  . LONG TERM MONITOR (3-14 DAYS)  . EKG 12-Lead  . ECHOCARDIOGRAM COMPLETE   No orders of the defined types were placed in this encounter.   Patient Instructions  Medication Instructions:  The current medical regimen is effective;  continue present plan and medications.  *If you need a refill on your cardiac medications before your next appointment, please call your pharmacy*   Testing/Procedures: Your physician has requested that you have an Exercise Myoview. A cardiac stress test is a cardiological test that measures the heart's ability to respond to external stress in a controlled clinical environment. The stress response is induced by exercise (exercise-treadmill). For further information please visit HugeFiesta.[email or web address]. If you have questions or concerns about your appointment, you can call the Nuclear Lab at [phone].    Echocardiogram - Your physician has requested that you have an echocardiogram. Echocardiography is a painless test that uses sound waves to create images of your heart. It provides your doctor with information about the size and shape of your heart and how well your heart's chambers and valves are working. This procedure takes approximately one hour. There are no restrictions for this procedure. This will be performed at our [facility] location - [address], [address].  ZIO XT- Long Term Monitor Instructions   Your physician has requested you wear your ZIO patch monitor___7___days.   This is a single patch monitor.  [employer] supplies one patch monitor per enrollment.  Additional stickers are not available.   Please do not apply patch if you will be having a Nuclear Stress Test, Echocardiogram, Cardiac CT, MRI, or Chest Xray during the time frame you would be  wearing the monitor. The patch cannot be worn during these tests.  You cannot remove and re-apply the ZIO XT patch monitor.   Your ZIO patch monitor will be sent USPS Priority mail from [facility] directly to your home address. The monitor may also be mailed to a PO BOX if home delivery is not available.   It may take 3-5 days to receive your monitor after you have been enrolled.   Once you have received you monitor, please review enclosed instructions.  Your monitor has already been registered assigning a specific monitor serial # to you.   Applying the monitor   Shave hair from upper left chest.   Hold abrader disc by orange tab.  Rub abrader in 40 strokes over left upper chest as indicated in your monitor instructions.   Clean area with 4 enclosed alcohol pads .  Use all pads to assure are is cleaned thoroughly.  Let dry.   Apply patch as indicated in monitor instructions.  Patch will be place under collarbone on left side of chest with arrow pointing upward.   Rub patch adhesive wings for 2 minutes.Remove white label marked "1".  Remove white label marked "2".  Rub patch adhesive  wings for 2 additional minutes.   While looking in a mirror, press and release button in center of patch.  A small green light will flash 3-4 times .  This will be your only indicator the monitor has been turned on.     Do not shower for the first 24 hours.  You may shower after the first 24 hours.   Press button if you feel a symptom. You will hear a small click.  Record Date, Time and Symptom in the Patient Log Book.   When you are ready to remove patch, follow instructions on last 2 pages of Patient Log Book.  Stick patch monitor onto last page of Patient Log Book.   Place Patient Log Book in [place] box.  Use locking tab on box and tape box closed securely.  The [employer] and [employer] has [employer] on it.  Please place in mailbox as soon as possible.  Your physician should have your test results  approximately 7 days after the monitor has been mailed back to [facility].   Call [facility] at [phone] if you have questions regarding your ZIO XT patch monitor.  Call them immediately if you see an orange light blinking on your monitor.   If your monitor falls off in less than 4 days contact our Monitor department at [phone].  If your monitor becomes loose or falls off after 4 days call [employer] at [phone] for suggestions on securing your monitor.     Follow-Up: At [facility], you and your health needs are our priority.  As part of our continuing mission to provide you with exceptional heart care, we have created designated Provider Care Teams.  These Care Teams include your primary Cardiologist (physician) and Advanced Practice Providers (APPs -  Physician Assistants and Nurse Practitioners) who all work together to provide you with the care you need, when you need it.  We recommend signing up for the patient portal called "MyChart".  Sign up information is provided on this After Visit Summary.  MyChart is used to connect with patients for Virtual Visits (Telemedicine).  Patients are able to view lab/test results, encounter notes, upcoming appointments, etc.  Non-urgent messages can be sent to your provider as well.   To learn more about what you can do with MyChart, go to [email or web address].    Your next appointment:   2 month(s)  The format for your next appointment:   In Person  Provider:   Eleonore Chiquito, MD        Signed, Addison Naegeli. Audie Box, [place]  [address], [place], [place] [address] [phone]  04/19/2020 4:57 PM

## 2020-04-19 ENCOUNTER — Encounter: Payer: Self-pay | Admitting: Radiology

## 2020-04-19 ENCOUNTER — Ambulatory Visit (INDEPENDENT_AMBULATORY_CARE_PROVIDER_SITE_OTHER): Payer: Medicare Other | Admitting: Cardiovascular Disease

## 2020-04-19 ENCOUNTER — Encounter: Payer: Self-pay | Admitting: Cardiovascular Disease

## 2020-04-19 ENCOUNTER — Other Ambulatory Visit: Payer: Self-pay

## 2020-04-19 ENCOUNTER — Ambulatory Visit (INDEPENDENT_AMBULATORY_CARE_PROVIDER_SITE_OTHER): Payer: Medicare Other

## 2020-04-19 VITALS — BP 108/70 | HR 52 | Ht 68.0 in | Wt 154.0 lb

## 2020-04-19 DIAGNOSIS — R0602 Shortness of breath: Secondary | ICD-10-CM

## 2020-04-19 DIAGNOSIS — R001 Bradycardia, unspecified: Secondary | ICD-10-CM | POA: Diagnosis not present

## 2020-04-19 DIAGNOSIS — R079 Chest pain, unspecified: Secondary | ICD-10-CM | POA: Diagnosis not present

## 2020-04-19 DIAGNOSIS — I2510 Atherosclerotic heart disease of native coronary artery without angina pectoris: Secondary | ICD-10-CM

## 2020-04-19 NOTE — Progress Notes (Signed)
Enrolled patient for a 7 Day Zio XT monitor to be mailed to patients home  

## 2020-04-19 NOTE — Patient Instructions (Signed)
Medication Instructions:  The current medical regimen is effective;  continue present plan and medications.  *If you need a refill on your cardiac medications before your next appointment, please call your pharmacy*   Testing/Procedures: Your physician has requested that you have an Exercise Myoview. A cardiac stress test is a cardiological test that measures the heart's ability to respond to external stress in a controlled clinical environment. The stress response is induced by exercise (exercise-treadmill). For further information please visit HugeFiesta.tn. If you have questions or concerns about your appointment, you can call the Nuclear Lab at [phone].    Echocardiogram - Your physician has requested that you have an echocardiogram. Echocardiography is a painless test that uses sound waves to create images of your heart. It provides your doctor with information about the size and shape of your heart and how well your heart's chambers and valves are working. This procedure takes approximately one hour. There are no restrictions for this procedure. This will be performed at our [facility] location - [address], [address].  ZIO XT- Long Term Monitor Instructions   Your physician has requested you wear your ZIO patch monitor___7___days.   This is a single patch monitor.  [employer] supplies one patch monitor per enrollment.  Additional stickers are not available.   Please do not apply patch if you will be having a Nuclear Stress Test, Echocardiogram, Cardiac CT, MRI, or Chest Xray during the time frame you would be wearing the monitor. The patch cannot be worn during these tests.  You cannot remove and re-apply the ZIO XT patch monitor.   Your ZIO patch monitor will be sent USPS Priority mail from [facility] directly to your home address. The monitor may also be mailed to a PO BOX if home delivery is not available.   It may take 3-5 days to receive your monitor after you  have been enrolled.   Once you have received you monitor, please review enclosed instructions.  Your monitor has already been registered assigning a specific monitor serial # to you.   Applying the monitor   Shave hair from upper left chest.   Hold abrader disc by orange tab.  Rub abrader in 40 strokes over left upper chest as indicated in your monitor instructions.   Clean area with 4 enclosed alcohol pads .  Use all pads to assure are is cleaned thoroughly.  Let dry.   Apply patch as indicated in monitor instructions.  Patch will be place under collarbone on left side of chest with arrow pointing upward.   Rub patch adhesive wings for 2 minutes.Remove white label marked "1".  Remove white label marked "2".  Rub patch adhesive wings for 2 additional minutes.   While looking in a mirror, press and release button in center of patch.  A small green light will flash 3-4 times .  This will be your only indicator the monitor has been turned on.     Do not shower for the first 24 hours.  You may shower after the first 24 hours.   Press button if you feel a symptom. You will hear a small click.  Record Date, Time and Symptom in the Patient Log Book.   When you are ready to remove patch, follow instructions on last 2 pages of Patient Log Book.  Stick patch monitor onto last page of Patient Log Book.   Place Patient Log Book in [place] box.  Use locking tab on box and tape  box closed securely.  The [employer] has [employer] on it.  Please place in mailbox as soon as possible.  Your physician should have your test results approximately 7 days after the monitor has been mailed back to [facility].   Call [place] at [phone] if you have questions regarding your ZIO XT patch monitor.  Call them immediately if you see an orange light blinking on your monitor.   If your monitor falls off in less than 4 days contact our Monitor department at [phone].  If your  monitor becomes loose or falls off after 4 days call [employer] at [phone] for suggestions on securing your monitor.     Follow-Up: At [facility], you and your health needs are our priority.  As part of our continuing mission to provide you with exceptional heart care, we have created designated Provider Care Teams.  These Care Teams include your primary Cardiologist (physician) and Advanced Practice Providers (APPs -  Physician Assistants and Nurse Practitioners) who all work together to provide you with the care you need, when you need it.  We recommend signing up for the patient portal called "MyChart".  Sign up information is provided on this After Visit Summary.  MyChart is used to connect with patients for Virtual Visits (Telemedicine).  Patients are able to view lab/test results, encounter notes, upcoming appointments, etc.  Non-urgent messages can be sent to your provider as well.   To learn more about what you can do with MyChart, go to [email or web address].    Your next appointment:   2 month(s)  The format for your next appointment:   In Person  Provider:   Eleonore Chiquito, MD

## 2020-04-23 DIAGNOSIS — R001 Bradycardia, unspecified: Secondary | ICD-10-CM | POA: Diagnosis not present

## 2020-04-28 ENCOUNTER — Other Ambulatory Visit (HOSPITAL_COMMUNITY)
Admission: RE | Admit: 2020-04-28 | Discharge: 2020-04-28 | Disposition: A | Discharge status: Home / Self Care | Payer: Medicare Other | Source: Ambulatory Visit | Attending: Cardiovascular Disease | Admitting: Cardiovascular Disease

## 2020-04-28 DIAGNOSIS — Z20822 Contact with and (suspected) exposure to covid-19: Secondary | ICD-10-CM | POA: Insufficient documentation

## 2020-04-28 DIAGNOSIS — Z01812 Encounter for preprocedural laboratory examination: Secondary | ICD-10-CM | POA: Diagnosis not present

## 2020-04-28 LAB — SARS CORONAVIRUS 2 (TAT 6-24 HRS): SARS Coronavirus 2: NEGATIVE

## 2020-04-30 ENCOUNTER — Observation Stay (HOSPITAL_COMMUNITY)
Admission: EM | Admit: 2020-04-30 | Discharge: 2020-05-01 | Disposition: A | Discharge status: Home / Self Care | Payer: Medicare Other | Attending: Internal Medicine | Admitting: Internal Medicine

## 2020-04-30 ENCOUNTER — Encounter (HOSPITAL_COMMUNITY): Payer: Self-pay

## 2020-04-30 ENCOUNTER — Emergency Department (HOSPITAL_COMMUNITY): Payer: Medicare Other

## 2020-04-30 ENCOUNTER — Other Ambulatory Visit: Payer: Self-pay

## 2020-04-30 DIAGNOSIS — R1011 Right upper quadrant pain: Secondary | ICD-10-CM | POA: Diagnosis not present

## 2020-04-30 DIAGNOSIS — J45909 Unspecified asthma, uncomplicated: Secondary | ICD-10-CM | POA: Insufficient documentation

## 2020-04-30 DIAGNOSIS — Z79899 Other long term (current) drug therapy: Secondary | ICD-10-CM | POA: Diagnosis not present

## 2020-04-30 DIAGNOSIS — E860 Dehydration: Secondary | ICD-10-CM | POA: Diagnosis not present

## 2020-04-30 DIAGNOSIS — R109 Unspecified abdominal pain: Secondary | ICD-10-CM | POA: Diagnosis not present

## 2020-04-30 DIAGNOSIS — Z8673 Personal history of transient ischemic attack (TIA), and cerebral infarction without residual deficits: Secondary | ICD-10-CM | POA: Insufficient documentation

## 2020-04-30 DIAGNOSIS — R1110 Vomiting, unspecified: Secondary | ICD-10-CM | POA: Insufficient documentation

## 2020-04-30 DIAGNOSIS — Z862 Personal history of diseases of the blood and blood-forming organs and certain disorders involving the immune mechanism: Secondary | ICD-10-CM

## 2020-04-30 DIAGNOSIS — K449 Diaphragmatic hernia without obstruction or gangrene: Secondary | ICD-10-CM | POA: Diagnosis not present

## 2020-04-30 DIAGNOSIS — R6883 Chills (without fever): Secondary | ICD-10-CM

## 2020-04-30 DIAGNOSIS — Z7982 Long term (current) use of aspirin: Secondary | ICD-10-CM | POA: Insufficient documentation

## 2020-04-30 DIAGNOSIS — I10 Essential (primary) hypertension: Secondary | ICD-10-CM | POA: Insufficient documentation

## 2020-04-30 DIAGNOSIS — Z87891 Personal history of nicotine dependence: Secondary | ICD-10-CM | POA: Insufficient documentation

## 2020-04-30 DIAGNOSIS — I2510 Atherosclerotic heart disease of native coronary artery without angina pectoris: Secondary | ICD-10-CM | POA: Diagnosis not present

## 2020-04-30 DIAGNOSIS — R1010 Upper abdominal pain, unspecified: Secondary | ICD-10-CM | POA: Diagnosis not present

## 2020-04-30 DIAGNOSIS — R079 Chest pain, unspecified: Secondary | ICD-10-CM | POA: Diagnosis not present

## 2020-04-30 DIAGNOSIS — R0602 Shortness of breath: Secondary | ICD-10-CM | POA: Diagnosis not present

## 2020-04-30 DIAGNOSIS — Z20822 Contact with and (suspected) exposure to covid-19: Secondary | ICD-10-CM | POA: Insufficient documentation

## 2020-04-30 LAB — CBC
HCT: 41.3 % (ref 39.0–52.0)
Hemoglobin: 14.1 g/dL (ref 13.0–17.0)
MCH: 31.3 pg (ref 26.0–34.0)
MCHC: 34.1 g/dL (ref 30.0–36.0)
MCV: 91.6 fL (ref 80.0–100.0)
Platelets: 185 10*3/uL (ref 150–400)
RBC: 4.51 MIL/uL (ref 4.22–5.81)
RDW: 13.2 % (ref 11.5–15.5)
WBC: 24.1 10*3/uL — ABNORMAL HIGH (ref 4.0–10.5)
nRBC: 0 % (ref 0.0–0.2)

## 2020-04-30 LAB — LACTIC ACID, PLASMA
Lactic Acid, Venous: 2.1 mmol/L (ref 0.5–1.9)
Lactic Acid, Venous: 1.2 mmol/L (ref 0.5–1.9)

## 2020-04-30 LAB — URINALYSIS, ROUTINE W REFLEX MICROSCOPIC
Bacteria, UA: NONE SEEN
Bilirubin Urine: NEGATIVE
Glucose, UA: NEGATIVE mg/dL
Ketones, ur: NEGATIVE mg/dL
Leukocytes,Ua: NEGATIVE
Nitrite: NEGATIVE
Protein, ur: NEGATIVE mg/dL
Specific Gravity, Urine: 1.027 (ref 1.005–1.030)
pH: 6 (ref 5.0–8.0)

## 2020-04-30 LAB — COMPREHENSIVE METABOLIC PANEL
ALT: 19 U/L (ref 0–44)
AST: 27 U/L (ref 15–41)
Albumin: 3.8 g/dL (ref 3.5–5.0)
Alkaline Phosphatase: 50 U/L (ref 38–126)
Anion gap: 11 (ref 5–15)
BUN: 21 mg/dL (ref 8–23)
CO2: 25 mmol/L (ref 22–32)
Calcium: 8.7 mg/dL — ABNORMAL LOW (ref 8.9–10.3)
Chloride: 100 mmol/L (ref 98–111)
Creatinine, Ser: 1.42 mg/dL — ABNORMAL HIGH (ref 0.61–1.24)
GFR, Estimated: 48 mL/min — ABNORMAL LOW (ref >60)
Glucose, Bld: 115 mg/dL — ABNORMAL HIGH (ref 70–99)
Potassium: 3.6 mmol/L (ref 3.5–5.1)
Sodium: 136 mmol/L (ref 135–145)
Total Bilirubin: 1.3 mg/dL — ABNORMAL HIGH (ref 0.3–1.2)
Total Protein: 6.9 g/dL (ref 6.5–8.1)

## 2020-04-30 LAB — RESP PANEL BY RT-PCR (FLU A&B, COVID) ARPGX2
Influenza A by PCR: NEGATIVE
Influenza B by PCR: NEGATIVE
SARS Coronavirus 2 by RT PCR: NEGATIVE

## 2020-04-30 LAB — LIPASE, BLOOD: Lipase: 23 U/L (ref 11–51)

## 2020-04-30 MED ORDER — ONDANSETRON HCL 4 MG PO TABS: 4.0000 mg | ORAL_TABLET | Freq: Four times a day (QID) | ORAL | Status: DC | PRN

## 2020-04-30 MED ORDER — SODIUM CHLORIDE 0.9 % IV BOLUS
1000.0000 mL | Freq: Once | INTRAVENOUS | Status: AC
  Administered 2020-04-30: 1000 mL via INTRAVENOUS

## 2020-04-30 MED ORDER — POLYETHYLENE GLYCOL 3350 17 G PO PACK: 17.0000 g | PACK | Freq: Every day | ORAL | Status: DC | PRN

## 2020-04-30 MED ORDER — ONDANSETRON HCL 4 MG/2ML IJ SOLN: 4.0000 mg | Freq: Four times a day (QID) | INTRAMUSCULAR | Status: DC | PRN

## 2020-04-30 MED ORDER — DOCUSATE SODIUM 100 MG PO CAPS
100.0000 mg | ORAL_CAPSULE | Freq: Two times a day (BID) | ORAL | Status: DC
  Administered 2020-04-30: 100 mg via ORAL
  Filled 2020-04-30 (×2): qty 1

## 2020-04-30 MED ORDER — PANTOPRAZOLE SODIUM 40 MG IV SOLR
40.0000 mg | INTRAVENOUS | Status: DC
  Administered 2020-04-30: 40 mg via INTRAVENOUS
  Filled 2020-04-30: qty 40

## 2020-04-30 MED ORDER — IOHEXOL 300 MG/ML  SOLN
100.0000 mL | Freq: Once | INTRAMUSCULAR | Status: AC | PRN
  Administered 2020-04-30: 100 mL via INTRAVENOUS

## 2020-04-30 MED ORDER — ACETAMINOPHEN 325 MG PO TABS
650.0000 mg | ORAL_TABLET | Freq: Four times a day (QID) | ORAL | Status: DC | PRN
  Administered 2020-05-01: 650 mg via ORAL
  Filled 2020-04-30: qty 2

## 2020-04-30 MED ORDER — SODIUM CHLORIDE 0.9 % IV SOLN: INTRAVENOUS | Status: DC

## 2020-04-30 MED ORDER — OXYCODONE HCL 5 MG PO TABS: 5.0000 mg | ORAL_TABLET | ORAL | Status: DC | PRN

## 2020-04-30 MED ORDER — ACETAMINOPHEN 650 MG RE SUPP: 650.0000 mg | Freq: Four times a day (QID) | RECTAL | Status: DC | PRN

## 2020-04-30 MED ORDER — ALUM & MAG HYDROXIDE-SIMETH 200-200-20 MG/5ML PO SUSP
30.0000 mL | Freq: Once | ORAL | Status: AC
  Administered 2020-04-30: 30 mL via ORAL
  Filled 2020-04-30: qty 30

## 2020-04-30 MED ORDER — IOHEXOL 350 MG/ML SOLN
75.0000 mL | Freq: Once | INTRAVENOUS | Status: AC | PRN
  Administered 2020-04-30: 75 mL via INTRAVENOUS

## 2020-04-30 MED ORDER — PANTOPRAZOLE SODIUM 40 MG IV SOLR
40.0000 mg | Freq: Once | INTRAVENOUS | Status: AC
  Administered 2020-04-30: 40 mg via INTRAVENOUS
  Filled 2020-04-30: qty 40

## 2020-04-30 NOTE — ED Triage Notes (Signed)
Pt presents with c/o abdominal pain that started last night with some vomiting. Pt just reports that he "doesn't feel good". EMS did come to pt's home last night and they didn't believe he needed to come then. Pt went to his PCP this morning and was told to come here.

## 2020-04-30 NOTE — ED Notes (Signed)
Pt aware urine sample is needed 

## 2020-04-30 NOTE — H&P (Signed)
History and Physical    Alex Oconnor:[id number] DOB: [birth year] DOA: 04/30/2020  PCP: Alex Orn, MD  Patient coming from: Home  Chief Complaint: chills  HPI: Alex Oconnor is a 87 y.o. male with medical history significant of HTN, gastroparesis, anxiety, HLD. Presenting with abdominal pain and chills. He reports that he just generally felt achy and bad yesterday. Last night he started having chills. He got up at 3 am to go to the bathroom and had to vomit. He decided to lay on the bathroom floor. His wife helped him back to bed, and she noted that he was weak and pale. She called for EMS. They evaluated him and he appeared to be ok. His vitals were ok. They decided to stay home. This morning he woke up still feeling back, so they called his PCP. It was recommended that he come to the ED. He denies any other aggravating or alleviating factors.   ED Course: CT ab/pelvis was negative. CTA PE Was negative. He was found to have an elevated white count. He was found to have an elevated creatinine. He was given a fluid bolus. [facility] was called for admission.    Review of Systems: Review of systems is otherwise negative for all not mentioned in HPI.   PMHx Past Medical History:  Diagnosis Date  . Arthritis   . Asthma   . Carotid artery occlusion   . Cataract of both eyes   . Complication of anesthesia    pts wife states BP drops; also had bout with pulse postop   . Coronary artery disease    stent placed 1999  . Dysrhythmia   . Gastroparesis   . GERD (gastroesophageal reflux disease)   . Hard of hearing   . Headache(784.0)    saw in ED 08/2006  . History of hiatal hernia   . Hyperlipemia   . Hypertension   . Irritable bowel syndrome   . Numbness    left arm pts wife states MD aware and no issues identified  . Rotator cuff disorder    left   . Sleep apnea    10/2001; does not use CPAP  . TIA (transient ischemic attack)    11/2011  . Urinary hesitancy   . Wears glasses      PSHx Past Surgical History:  Procedure Laterality Date  . abd ultrasound      03/2007-xray of abd;01/2008-ultrasound of abd;01/2009-ultrasound of abd  . BACK SURGERY     MRI lower back 09/2010;back injected 10/2010;11/2010 spinal lumbar fusion L4-5  . CARDIAC CATHETERIZATION     May 1999  . CARDIAC CATHETERIZATION N/A 08/23/2014   Procedure: Left Heart Cath and Coronary Angiography;  Surgeon: Belva Crome, MD;  Location: [facility];  Service: Cardiovascular;  Laterality: N/A;  . CARDIOVASCULAR STRESS TEST     10/1999;09/2002  . CAROTID ENDARTERECTOMY     right side 1997  . COLONOSCOPY    . colonscopy     02/2007  . CORONARY STENT PLACEMENT    . eyelid surgery     removal of ingrown eyelashes  . GREEN LIGHT LASER TURP (TRANSURETHRAL RESECTION OF PROSTATE N/A 06/19/2015   Procedure: GREEN LIGHT LASER TURP (TRANSURETHRAL RESECTION OF PROSTATE;  Surgeon: Alex Seal, MD;  Location: [facility];  Service: Urology;  Laterality: N/A;  . HERNIA REPAIR     2001; inguinal - right   . HIATAL HERNIA REPAIR N/A 12/08/2013   Procedure: LAPAROSCOPIC REPAIR OF HIATAL HERNIA;  Surgeon: Alex Earls, MD;  Location: [facility];  Service: General;  Laterality: N/A;  . injection of knees     06/2012 both knees injected;10/2013 both knees injected   . INSERTION OF MESH N/A 12/08/2013   Procedure: INSERTION OF MESH;  Surgeon: Alex Earls, MD;  Location: [facility];  Service: General;  Laterality: N/A;  . KNEE ARTHROSCOPY     right knee 06/2002;12/2005-left knee  . right hip injected     08/2013  . TONSILLECTOMY      SocHx  reports that he quit smoking about 39 years ago. His smoking use included cigarettes. He has a 40.00 pack-year smoking history. He has never used smokeless tobacco. He reports current alcohol use. He reports that he does not use drugs.  Allergies  Allergen Reactions  . Penicillins Hives       . Plavix [Clopidogrel Bisulfate] Other (See Comments)    Developed a taste disorder.     FamHx Family History  Problem Relation Age of Onset  . Congestive Heart Failure Brother     Prior to Admission medications   Medication Sig Start Date End Date Taking? Authorizing Provider  aspirin 81 MG tablet Take 81 mg by mouth daily. Pt was taking daily stopped taking two days ago (11/16/2013)    [provider]  benazepril (LOTENSIN) 10 MG tablet Take 10 mg by mouth daily. 07/25/14   [provider]  cholecalciferol (VITAMIN D) 1000 UNITS tablet Take 1,000 Units by mouth daily.    [provider]  fluticasone (FLONASE) 50 MCG/ACT nasal spray Place 1 spray into both nostrils daily as needed for allergies or rhinitis.    [provider]  LORazepam (ATIVAN) 1 MG tablet Take 0.5 mg by mouth at bedtime.    [provider]  ondansetron (ZOFRAN ODT) 8 MG disintegrating tablet 8mg  ODT q8 hours prn nausea 06/18/19   Oconnor, April, MD  Probiotic Product (PROBIOTIC PO) Take 1 tablet by mouth daily.    [provider]  saw palmetto 500 MG capsule Take 500 mg by mouth every morning.     [provider]  simvastatin (ZOCOR) 20 MG tablet Take 20 mg by mouth every morning.     [provider]    Physical Exam: Vitals:   04/30/20 1300 04/30/20 1400 04/30/20 1530 04/30/20 1630  BP: 105/67 109/71 103/71 103/71  Pulse: 72 73 73 73  Resp: (!) 21 18 18 18   Temp:      TempSrc:      SpO2: 93% 94% 94% 94%    General: 87 y.o. male resting in bed in NAD Eyes: PERRL, normal sclera ENMT: Nares patent w/o discharge, orophaynx clear, dentition normal, ears w/o discharge/lesions/ulcers Neck: Supple, trachea midline Cardiovascular: RRR, +S1, S2, no m/g/r, equal pulses throughout Respiratory: CTABL, no w/r/r, normal WOB GI: BS+, ND, RUQ TTP, no masses noted, no organomegaly noted MSK: No e/c/c Skin: No rashes, bruises, ulcerations noted Neuro: A&O x 3, no focal deficits Psyc: Appropriate interaction and affect,  calm/cooperative  Labs on Admission: I have personally reviewed following labs and imaging studies  CBC: Recent Labs  Lab 04/30/20 1234  WBC 24.1*  HGB 14.1  HCT 41.3  MCV 91.6  PLT 950   Basic Metabolic Panel: Recent Labs  Lab 04/30/20 1234  NA 136  K 3.6  CL 100  CO2 25  GLUCOSE 115*  BUN 21  CREATININE 1.42*  CALCIUM 8.7*   GFR: Estimated Creatinine Clearance: 36.8 mL/min (A) (  by C-G formula based on SCr of 1.42 mg/dL (H)). Liver Function Tests: Recent Labs  Lab 04/30/20 1234  AST 27  ALT 19  ALKPHOS 50  BILITOT 1.3*  PROT 6.9  ALBUMIN 3.8   Recent Labs  Lab 04/30/20 1234  LIPASE 23   No results for input(s): AMMONIA in the last 168 hours. Coagulation Profile: No results for input(s): INR, PROTIME in the last 168 hours. Cardiac Enzymes: No results for input(s): CKTOTAL, CKMB, CKMBINDEX, TROPONINI in the last 168 hours. BNP (last 3 results) No results for input(s): PROBNP in the last 8760 hours. HbA1C: No results for input(s): HGBA1C in the last 72 hours. CBG: No results for input(s): GLUCAP in the last 168 hours. Lipid Profile: No results for input(s): CHOL, HDL, LDLCALC, TRIG, CHOLHDL, LDLDIRECT in the last 72 hours. Thyroid Function Tests: No results for input(s): TSH, T4TOTAL, FREET4, T3FREE, THYROIDAB in the last 72 hours. Anemia Panel: No results for input(s): VITAMINB12, FOLATE, FERRITIN, TIBC, IRON, RETICCTPCT in the last 72 hours. Urine analysis:    Component Value Date/Time   COLORURINE YELLOW 04/30/2020 1450   APPEARANCEUR CLEAR 04/30/2020 1450   LABSPEC 1.027 04/30/2020 1450   PHURINE 6.0 04/30/2020 1450   GLUCOSEU NEGATIVE 04/30/2020 1450   HGBUR SMALL (A) 04/30/2020 1450   BILIRUBINUR NEGATIVE 04/30/2020 1450   KETONESUR NEGATIVE 04/30/2020 1450   PROTEINUR NEGATIVE 04/30/2020 1450   NITRITE NEGATIVE 04/30/2020 1450   LEUKOCYTESUR NEGATIVE 04/30/2020 1450    Radiological Exams on Admission: CT ABDOMEN PELVIS W  CONTRAST  Result Date: 04/30/2020 CLINICAL DATA:  Acute onset abdominal pain with vomiting. EXAM: CT ABDOMEN AND PELVIS WITH CONTRAST TECHNIQUE: Multidetector CT imaging of the abdomen and pelvis was performed using the standard protocol following bolus administration of intravenous contrast. CONTRAST:  180mL OMNIPAQUE IOHEXOL 300 MG/ML  SOLN COMPARISON:  06/18/2019. FINDINGS: Lower chest: Suspect mild basilar subpleural fibrosis. Heart is at the upper limits of normal in size. Atherosclerotic calcification of the aorta and coronary arteries. No pericardial or pleural effusion. Moderate hiatal hernia. Hepatobiliary: Well-circumscribed low-attenuation lesions in the liver measure up to 1.8 cm similar and likely cysts. Liver and gallbladder are otherwise unremarkable. No biliary ductal dilatation. Pancreas: Negative. Spleen: Negative. Adrenals/Urinary Tract: Adrenal glands are unremarkable. Low-attenuation lesions in the kidneys measure up to 4.9 cm on the right and are indicative of cysts. Ureters are decompressed. Bladder is grossly unremarkable. Stomach/Bowel: Moderate hiatal hernia. Stomach, small bowel, appendix and colon are unremarkable. Vascular/Lymphatic: Atherosclerotic calcification of the aorta. No pathologically enlarged lymph nodes. Reproductive: Prostate is mildly enlarged. Other: No free fluid.  Mesenteries and peritoneum are unremarkable. Musculoskeletal: Postoperative and degenerative changes in the spine. No worrisome lytic or sclerotic lesions. IMPRESSION: 1. No acute findings to explain the patient's clinical history. 2. 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 3. Moderate hiatal hernia. 4. Mildly enlarged prostate. 5. Aortic atherosclerosis (ICD10-I70.0). Coronary artery calcification. Electronically Signed   By: Lorin Picket M.D.   On: 04/30/2020 14:28   DG Chest Portable 1 View  Result Date: 04/30/2020 CLINICAL DATA:  Shortness of breath EXAM: PORTABLE CHEST 1 VIEW COMPARISON:   11/18/2013 FINDINGS: Heart size is unremarkable. There is a hiatal hernia. Aortic calcifications are noted. There is no pneumothorax or large pleural effusion. No acute osseous abnormality. IMPRESSION: No active disease. Electronically Signed   By: Constance Holster M.D.   On: 04/30/2020 15:38    Assessment/Plan AKI     - place in obs, tele     - fluids     -  no obstruction noted on CT; he does have some cystic lesion on the right noted on CT     - follow I&O, watch nephrotoxins  Leukocytosis     - reactive?     - he has no fever, UA is unremarkable, Bld Cx pending     - right now, hold abx  Abdominal pain Gastroparesis     - pain control, NPO for now, fluids, GI cocktail     - CT ab/pelvis is negative     - he says he's been constipated this last couple of days     - colace, miralax  Hx of HTN     - soft pressures when he came in; had fluid boluses     - hold BP meds tonight; reassess for AM  HLD     - continue home regimen once med rec complete  DVT prophylaxis: SCDs  Code Status: DNR, confirmed with wife at bedside  Family Communication: with wife at bedside  Consults called: none   Status is: Observation  The patient remains OBS appropriate and will d/c before 2 midnights.  Dispo: The patient is from: Home              Anticipated d/c is to: Home              Patient currently is not medically stable to d/c.   Difficult to place patient No  Jonnie Finner DO [facility]  If 7PM-7AM, please contact night-coverage [email or web address]  04/30/2020, 4:45 PM

## 2020-04-30 NOTE — ED Notes (Signed)
Pt aware urine sample is needed; urinal at bedside.

## 2020-04-30 NOTE — ED Provider Notes (Signed)
[facility] DEPT Provider Note   CSN: [id number] Arrival date & time: 04/30/20  1101     History Chief Complaint  Patient presents with  . Abdominal Pain    Alex Oconnor is a 87 y.o. male.  Pt presents to the ED today with not feeling well.  Pt's wife gives most the history.  She said he developed shaking chills around 0200 today.  Around 0300, he was vomiting in the bathroom and ended up on the floor.  She called EMS who evaluated his vitals, but told him they were ok and opted not to take pt to the ED.  Pt was supposed to have a stress test tomorrow, so he had a covid test 2 days ago which was negative.  Pt's wife took pt to his pcp this am.  Pcp recommended that pt get an eval in the ED.  Pt currently denies any pain, but did have some abdominal pain.          Past Medical History:  Diagnosis Date  . Arthritis   . Asthma   . Carotid artery occlusion   . Cataract of both eyes   . Complication of anesthesia    pts wife states BP drops; also had bout with pulse postop   . Coronary artery disease    stent placed 1999  . Dysrhythmia   . Gastroparesis   . GERD (gastroesophageal reflux disease)   . Hard of hearing   . Headache(784.0)    saw in ED 08/2006  . History of hiatal hernia   . Hyperlipemia   . Hypertension   . Irritable bowel syndrome   . Numbness    left arm pts wife states MD aware and no issues identified  . Rotator cuff disorder    left   . Sleep apnea    10/2001; does not use CPAP  . TIA (transient ischemic attack)    11/2011  . Urinary hesitancy   . Wears glasses     Patient Active Problem List   Diagnosis Date Noted  . Angina, class III (Hermosa) 08/23/2014  . Lap Repair of large type III hiatus hernia with Nissen fundoplication over a #79 bougie Oct 2015 12/08/2013  . Hiatus hernia syndrome 12/08/2013  . 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 TIA (transient ischemic attack)  12/06/2011  . Transient global amnesia 12/06/2011  . Bacterial overgrowth syndrome 12/06/2011  . Hypertension 12/06/2011  . H/O carotid artery stenosis 12/06/2011  . CAD (coronary artery disease) 12/06/2011    Past Surgical History:  Procedure Laterality Date  . abd ultrasound      03/2007-xray of abd;01/2008-ultrasound of abd;01/2009-ultrasound of abd  . BACK SURGERY     MRI lower back 09/2010;back injected 10/2010;11/2010 spinal lumbar fusion L4-5  . CARDIAC CATHETERIZATION     May 1999  . CARDIAC CATHETERIZATION N/A 08/23/2014   Procedure: Left Heart Cath and Coronary Angiography;  Surgeon: Belva Crome, MD;  Location: [facility];  Service: Cardiovascular;  Laterality: N/A;  . CARDIOVASCULAR STRESS TEST     10/1999;09/2002  . CAROTID ENDARTERECTOMY     right side 1997  . COLONOSCOPY    . colonscopy     02/2007  . CORONARY STENT PLACEMENT    . eyelid surgery     removal of ingrown eyelashes  . GREEN LIGHT LASER TURP (TRANSURETHRAL RESECTION OF PROSTATE N/A 06/19/2015   Procedure: GREEN LIGHT LASER TURP (  TRANSURETHRAL RESECTION OF PROSTATE;  Surgeon: Irine Seal, MD;  Location: [facility];  Service: Urology;  Laterality: N/A;  . HERNIA REPAIR     2001; inguinal - right   . HIATAL HERNIA REPAIR N/A 12/08/2013   Procedure: LAPAROSCOPIC REPAIR OF HIATAL HERNIA;  Surgeon: Pedro Earls, MD;  Location: [facility];  Service: General;  Laterality: N/A;  . injection of knees     06/2012 both knees injected;10/2013 both knees injected   . INSERTION OF MESH N/A 12/08/2013   Procedure: INSERTION OF MESH;  Surgeon: Pedro Earls, MD;  Location: [facility];  Service: General;  Laterality: N/A;  . KNEE ARTHROSCOPY     right knee 06/2002;12/2005-left knee  . right hip injected     08/2013  . TONSILLECTOMY         Family History  Problem Relation Age of Onset  . Congestive Heart Failure Brother     Social History   Tobacco Use  . Smoking status: Former Smoker    Packs/day: 2.00     Years: 20.00    Pack years: 40.00    Types: Cigarettes    Quit date: 11/24/1980    Years since quitting: 39.4  . Smokeless tobacco: Never Used  Substance Use Topics  . Alcohol use: Yes    Comment: past alcohol abuse quit 30 years ago   . Drug use: No    Home Medications Prior to Admission medications   Medication Sig Start Date End Date Taking? Authorizing Provider  aspirin 81 MG tablet Take 81 mg by mouth daily. Pt was taking daily stopped taking two days ago (11/16/2013)    [provider]  benazepril (LOTENSIN) 10 MG tablet Take 10 mg by mouth daily. 07/25/14   [provider]  cholecalciferol (VITAMIN D) 1000 UNITS tablet Take 1,000 Units by mouth daily.    [provider]  fluticasone (FLONASE) 50 MCG/ACT nasal spray Place 1 spray into both nostrils daily as needed for allergies or rhinitis.    [provider]  LORazepam (ATIVAN) 1 MG tablet Take 0.5 mg by mouth at bedtime.    [provider]  ondansetron (ZOFRAN ODT) 8 MG disintegrating tablet 8mg  ODT q8 hours prn nausea 06/18/19   Palumbo, April, MD  Probiotic Product (PROBIOTIC PO) Take 1 tablet by mouth daily.    [provider]  saw palmetto 500 MG capsule Take 500 mg by mouth every morning.     [provider]  simvastatin (ZOCOR) 20 MG tablet Take 20 mg by mouth every morning.     [provider]    Allergies    Penicillins and Plavix [clopidogrel bisulfate]  Review of Systems   Review of Systems  Gastrointestinal: Positive for abdominal pain, diarrhea, nausea and vomiting.  Neurological: Positive for weakness.  All other systems reviewed and are negative.   Physical Exam Updated Vital Signs BP 103/71   Pulse 73   Temp 98.7 F (37.1 C) (Oral)   Resp 18   SpO2 94%   Physical Exam Vitals and nursing note reviewed.  Constitutional:      Appearance: He is well-developed. He is ill-appearing.  HENT:     Head: Normocephalic and atraumatic.      Mouth/Throat:     Mouth: Mucous membranes are dry.  Eyes:     Extraocular Movements: Extraocular movements intact.     Pupils: Pupils are equal, round, and reactive to light.  Cardiovascular:     Rate and Rhythm: Normal  rate and regular rhythm.  Pulmonary:     Effort: Pulmonary effort is normal.     Breath sounds: Normal breath sounds.  Abdominal:     General: Abdomen is flat. Bowel sounds are normal.     Palpations: Abdomen is soft.     Tenderness: There is generalized abdominal tenderness.  Skin:    General: Skin is warm.     Capillary Refill: Capillary refill takes less than 2 seconds.  Neurological:     General: No focal deficit present.     Mental Status: He is alert and oriented to person, place, and time.  Psychiatric:        Mood and Affect: Mood normal.        Behavior: Behavior normal.     ED Results / Procedures / Treatments   Labs (all labs ordered are listed, but only abnormal results are displayed) Labs Reviewed  COMPREHENSIVE METABOLIC PANEL - Abnormal; Notable for the following components:      Result Value   Glucose, Bld 115 (*)    Creatinine, Ser 1.42 (*)    Calcium 8.7 (*)    Total Bilirubin 1.3 (*)    GFR, Estimated 48 (*)    All other components within normal limits  CBC - Abnormal; Notable for the following components:   WBC 24.1 (*)    All other components within normal limits  URINALYSIS, ROUTINE W REFLEX MICROSCOPIC - Abnormal; Notable for the following components:   Hgb urine dipstick SMALL (*)    All other components within normal limits  RESP PANEL BY RT-PCR (FLU A&B, COVID) [id number]  CULTURE, BLOOD (ROUTINE X 2)  CULTURE, BLOOD (ROUTINE X 2)  LIPASE, BLOOD  LACTIC ACID, PLASMA  LACTIC ACID, PLASMA    EKG None  Radiology CT ABDOMEN PELVIS W CONTRAST  Result Date: 04/30/2020 CLINICAL DATA:  Acute onset abdominal pain with vomiting. EXAM: CT ABDOMEN AND PELVIS WITH CONTRAST TECHNIQUE: Multidetector CT imaging of the abdomen and pelvis  was performed using the standard protocol following bolus administration of intravenous contrast. CONTRAST:  13mL OMNIPAQUE IOHEXOL 300 MG/ML  SOLN COMPARISON:  06/18/2019. FINDINGS: Lower chest: Suspect mild basilar subpleural fibrosis. Heart is at the upper limits of normal in size. Atherosclerotic calcification of the aorta and coronary arteries. No pericardial or pleural effusion. Moderate hiatal hernia. Hepatobiliary: Well-circumscribed low-attenuation lesions in the liver measure up to 1.8 cm similar and likely cysts. Liver and gallbladder are otherwise unremarkable. No biliary ductal dilatation. Pancreas: Negative. Spleen: Negative. Adrenals/Urinary Tract: Adrenal glands are unremarkable. Low-attenuation lesions in the kidneys measure up to 4.9 cm on the right and are indicative of cysts. Ureters are decompressed. Bladder is grossly unremarkable. Stomach/Bowel: Moderate hiatal hernia. Stomach, small bowel, appendix and colon are unremarkable. Vascular/Lymphatic: Atherosclerotic calcification of the aorta. No pathologically enlarged lymph nodes. Reproductive: Prostate is mildly enlarged. Other: No free fluid.  Mesenteries and peritoneum are unremarkable. Musculoskeletal: Postoperative and degenerative changes in the spine. No worrisome lytic or sclerotic lesions. IMPRESSION: 1. No acute findings to explain the patient's clinical history. 2. 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 3. Moderate hiatal hernia. 4. Mildly enlarged prostate. 5. Aortic atherosclerosis (ICD10-I70.0). Coronary artery calcification. Electronically Signed   By: Lorin Picket M.D.   On: 04/30/2020 14:28    Procedures Procedures   Medications Ordered in ED Medications  iohexol (OMNIPAQUE) 300 MG/ML solution 100 mL (100 mLs Intravenous Contrast Given 04/30/20 1405)  sodium chloride 0.9 % bolus 1,000 mL (1,000 mLs Intravenous New Bag/Given 04/30/20  1531)    ED Course  I have reviewed the triage vital signs and the  nursing notes.  Pertinent labs & imaging results that were available during my care of the patient were reviewed by me and considered in my medical decision making (see chart for details).    MDM Rules/Calculators/A&P                            CT abd pelvis negative.  IVFs ordered.  WBC elevated to 24.  CTA added.  Pt signed out to Dr. Roderic Palau at shift change.  Final Clinical Impression(s) / ED Diagnoses Final diagnoses:  Chills  Dehydration    Rx / DC Orders ED Discharge Orders    None       Isla Pence, MD 04/30/20 1536

## 2020-05-01 DIAGNOSIS — R1011 Right upper quadrant pain: Secondary | ICD-10-CM | POA: Diagnosis not present

## 2020-05-01 DIAGNOSIS — R109 Unspecified abdominal pain: Secondary | ICD-10-CM | POA: Diagnosis not present

## 2020-05-01 LAB — CBC
HCT: 35 % — ABNORMAL LOW (ref 39.0–52.0)
Hemoglobin: 11.6 g/dL — ABNORMAL LOW (ref 13.0–17.0)
MCH: 31 pg (ref 26.0–34.0)
MCHC: 33.1 g/dL (ref 30.0–36.0)
MCV: 93.6 fL (ref 80.0–100.0)
Platelets: 152 10*3/uL (ref 150–400)
RBC: 3.74 MIL/uL — ABNORMAL LOW (ref 4.22–5.81)
RDW: 13.5 % (ref 11.5–15.5)
WBC: 14.8 10*3/uL — ABNORMAL HIGH (ref 4.0–10.5)
nRBC: 0 % (ref 0.0–0.2)

## 2020-05-01 LAB — COMPREHENSIVE METABOLIC PANEL
ALT: 17 U/L (ref 0–44)
AST: 24 U/L (ref 15–41)
Albumin: 3 g/dL — ABNORMAL LOW (ref 3.5–5.0)
Alkaline Phosphatase: 43 U/L (ref 38–126)
Anion gap: 7 (ref 5–15)
BUN: 17 mg/dL (ref 8–23)
CO2: 20 mmol/L — ABNORMAL LOW (ref 22–32)
Calcium: 7.7 mg/dL — ABNORMAL LOW (ref 8.9–10.3)
Chloride: 108 mmol/L (ref 98–111)
Creatinine, Ser: 1.2 mg/dL (ref 0.61–1.24)
GFR, Estimated: 59 mL/min — ABNORMAL LOW (ref >60)
Glucose, Bld: 88 mg/dL (ref 70–99)
Potassium: 3.5 mmol/L (ref 3.5–5.1)
Sodium: 135 mmol/L (ref 135–145)
Total Bilirubin: 0.9 mg/dL (ref 0.3–1.2)
Total Protein: 5.5 g/dL — ABNORMAL LOW (ref 6.5–8.1)

## 2020-05-01 NOTE — Discharge Summary (Signed)
Physician Discharge Summary  Alex Oconnor YIF:[id number] DOB: [birth year] DOA: 04/30/2020  PCP: Lavone Orn, MD  Admit date: 04/30/2020 Discharge date: 05/01/2020  Admitted From: Home Discharge disposition: Home   Code Status: DNR  Diet Recommendation: Cardiac diet  Discharge Diagnosis:   Active Problems:   Abdominal pain  Chief Complaint  Patient presents with  . Abdominal Pain    Brief narrative: Alex Oconnor is a 87 y.o. male with PMH significant for HTN HLD, CAD status post stent, dysrhythmia, sleep apnea, carotid artery occlusion, GERD, gastroparesis, irritable bowel syndrome, anxiety.  Patient presented to the ED on 3/7 with complaint of abdominal pain, vomiting and chills.  In the ED, patient was hemodynamically stable. Labs with creatinine elevated 1.42, WC count elevated to 24,000 CT abdomen pelvis was unremarkable. Admitted to hospitalist service  Subjective: Patient was seen and examined this morning.  Pleasant elderly Caucasian male.  Lying on bed.  Not in distress.  Had a bowel movement this morning.  Feels better.  Abdominal pain has improved.  No more vomiting.  Tolerated oral intake this morning Chart reviewed Remains hemodynamically stable this morning  Hospital course: Abdominal pain, vomiting -With history of GERD, gastroparesis, irritable bowel syndrome -CT abdomen pelvis unremarkable. -Patient reportedly has been constipated for last few days.  Had a bowel movement this morning. -Currently on Colace and MiraLAX. -Continue Protonix  AKI -Improving with IV fluid Recent Labs    06/18/19 0401 04/30/20 1234 05/01/20 0402  BUN 21 21 17   CREATININE 1.10 1.42* 1.20   Leukocytosis/lactic acidosis -No evidence of infection. -Blood culture pending.  Currently not on antibiotics -Lactic acid level improved with hydration.  Repeat WBC count this morning shows improvement to 15,000. Recent Labs  Lab 04/30/20 1234 04/30/20 1530 04/30/20 1823  05/01/20 0402  WBC 24.1*  --   --  14.8*  LATICACIDVEN  --  2.1* 1.2  --    Essential hypertension -Home meds include benazepril 10 mg daily -Resume the same post discharge.  CAD status post stents Hyperlipidemia -On aspirin 81 mg daily and simvastatin 20 mg daily at home   Wound care: Incision (Closed) 12/08/13 Abdomen (Active)  Date First Assessed/Time First Assessed: 12/08/13 0931   Location: Abdomen    No assessment data to display     No Linked orders to display     Incision - 6 Ports Abdomen Left;Lateral 2: Left;Medial Left;Medial;Upper Upper;Umbilicus Right;Medial Right;Lateral (Active)  Placement Date/Time: 12/08/13 0800   Location of Ports: Abdomen  Location Orientation: Left;Lateral  Port: 2:  Location Orientation: Left;Medial  Location Orientation: Left;Medial;Upper  Location Orientation: Upper;Umbilicus  Location Orientation: Right;    Assessments 12/08/2013  9:46 AM 12/11/2013  9:00 AM  Port 1 Site Assessment - Clean;Dry  Port 1 Margins - Attached edges (approximated)  Port 1 Drainage Amount - None  Port 1 Dressing Type Adhesive bandage Liquid skin adhesive  Port 1 Dressing Status - Clean;Dry;Intact  Port 2 Site Assessment - Clean;Dry  Port 2 Margins - Attached edges (approximated)  Port 2 Drainage Amount - None  Port 2 Dressing Type Adhesive bandage Liquid skin adhesive  Port 2 Dressing Status - Clean;Dry;Intact  Port 3 Site Assessment - Clean;Dry  Port 3 Margins - Attached edges (approximated)  Port 3 Drainage Amount - None  Port 3 Dressing Type Adhesive bandage Liquid skin adhesive  Port 3 Dressing Status - Clean;Dry  Port 4 Site Assessment - Clean;Dry  Port 4 Margins - Attached edges (approximated)  Port 4 Drainage  Amount - None  Port 4 Dressing Type Adhesive bandage Liquid skin adhesive  Port 4 Dressing Status - Clean;Dry  Port 5 Margins - Attached edges (approximated)  Port 5 Drainage Amount - None  Port 5 Dressing Type Adhesive bandage Liquid skin  adhesive  Port 5 Dressing Status - Clean;Dry  Port 6 Margins - Attached edges (approximated)  Port 6 Drainage Amount - None  Port 6 Dressing Type Adhesive bandage Liquid skin adhesive  Port 6 Dressing Status - Clean;Dry     No Linked orders to display    Discharge Exam:   Vitals:   04/30/20 1830 04/30/20 2015 04/30/20 2112 05/01/20 0404  BP: (!) 141/75 114/67 131/80 121/68  Pulse: 61 (!) 55 (!) 58 (!) 54  Resp: 15 15 19 17   Temp:  98.3 F (36.8 C) 98.8 F (37.1 C) 98.5 F (36.9 C)  TempSrc:  Oral    SpO2: 98% 98% 98% 97%    There is no height or weight on file to calculate BMI.  General exam: Pleasant.  Not in distress Skin: No rashes, lesions or ulcers. HEENT: Atraumatic, normocephalic, no obvious bleeding Lungs: Clear to auscultation bilaterally CVS: Regular rate and rhythm, no murmur GI/Abd soft, nontender, nondistended, bowel sound present CNS: Alert, awake, oriented x3 Psychiatry: Mood appropriate Extremities: No pedal edema, no calf tenderness  Follow ups:   Discharge Instructions    Diet - low sodium heart healthy   Complete by: As directed    Increase activity slowly   Complete by: As directed       Follow-up Information    Lavone Orn, MD Follow up.   Specialty: Internal Medicine Contact information: [address], [address] [place] [address] [phone]               Recommendations for Outpatient Follow-Up:   1. Follow-up with PCP as an outpatient  Discharge Instructions:  Follow with Primary MD Lavone Orn, MD in 7 days   Get CBC/BMP checked in next visit within 1 week by PCP or SNF MD ( we routinely change or add medications that can affect your baseline labs and fluid status, therefore we recommend that you get the mentioned basic workup next visit with your PCP, your PCP may decide not to get them or add new tests based on their clinical decision)  On your next visit with your PCP, please Get Medicines reviewed and  adjusted.  Please request your PCP  to go over all Hospital Tests and Procedure/Radiological results at the follow up, please get all Hospital records sent to your Prim MD by signing hospital release before you go home.  Activity: As tolerated with Full fall precautions use walker/cane & assistance as needed  For Heart failure patients - Check your Weight same time everyday, if you gain over 2 pounds, or you develop in leg swelling, experience more shortness of breath or chest pain, call your Primary MD immediately. Follow Cardiac Low Salt Diet and 1.5 lit/day fluid restriction.  If you have smoked or chewed Tobacco in the last 2 yrs please stop smoking, stop any regular Alcohol  and or any Recreational drug use.  If you experience worsening of your admission symptoms, develop shortness of breath, life threatening emergency, suicidal or homicidal thoughts you must seek medical attention immediately by calling 911 or calling your MD immediately  if symptoms less severe.  You Must read complete instructions/literature along with all the possible adverse reactions/side effects for all the Medicines you take  and that have been prescribed to you. Take any new Medicines after you have completely understood and accpet all the possible adverse reactions/side effects.   Do not drive, operate heavy machinery, perform activities at heights, swimming or participation in water activities or provide baby sitting services if your were admitted for syncope or siezures until you have seen by Primary MD or a Neurologist and advised to do so again.  Do not drive when taking Pain medications.  Do not take more than prescribed Pain, Sleep and Anxiety Medications  Wear Seat belts while driving.   Please note You were cared for by a hospitalist during your hospital stay. If you have any questions about your discharge medications or the care you received while you were in the hospital after you are discharged, you can  call the unit and asked to speak with the hospitalist on call if the hospitalist that took care of you is not available. Once you are discharged, your primary care physician will handle any further medical issues. Please note that NO REFILLS for any discharge medications will be authorized once you are discharged, as it is imperative that you return to your primary care physician (or establish a relationship with a primary care physician if you do not have one) for your aftercare needs so that they can reassess your need for medications and monitor your lab values.    Allergies as of 05/01/2020      Reactions   Pantoprazole Rash   Penicillins Hives      Plavix [clopidogrel Bisulfate] Other (See Comments)   Developed a taste disorder.   Ciprofloxacin Rash   Sulfa Antibiotics Itching, Rash      Medication List    STOP taking these medications   ondansetron 8 MG disintegrating tablet Commonly known as: Zofran ODT     TAKE these medications   aspirin 81 MG tablet Take 81 mg by mouth daily.   benazepril 10 MG tablet Commonly known as: LOTENSIN Take 10 mg by mouth daily.   budesonide 180 MCG/ACT inhaler Commonly known as: PULMICORT Inhale 1 puff into the lungs daily as needed (wheezing).   cholecalciferol 1000 units tablet Commonly known as: VITAMIN D Take 1,000 Units by mouth daily.   Coenzyme Q10 100 MG capsule Take 100 mg by mouth daily.   Flovent HFA 110 MCG/ACT inhaler Generic drug: fluticasone Inhale 1 puff into the lungs daily as needed.   fluticasone 50 MCG/ACT nasal spray Commonly known as: FLONASE Place 1 spray into both nostrils daily as needed for allergies or rhinitis.   LORazepam 1 MG tablet Commonly known as: ATIVAN Take 0.5 mg by mouth at bedtime.   Omeprazole-Sodium Bicarbonate 20-1100 MG Caps capsule Commonly known as: ZEGERID Take 1 capsule by mouth daily before breakfast.   PROBIOTIC ACIDOPHILUS PO Take 1 capsule by mouth daily.   PROBIOTIC  PO Take 1 tablet by mouth daily.   saw palmetto 500 MG capsule Take 500 mg by mouth every morning.   simvastatin 20 MG tablet Commonly known as: ZOCOR Take 20 mg by mouth every morning.       Time coordinating discharge: 35 minutes  The results of significant diagnostics from this hospitalization (including imaging, microbiology, ancillary and laboratory) are listed below for reference.    Procedures and Diagnostic Studies:   CT Angio Chest PE W and/or Wo Contrast  Result Date: 04/30/2020 CLINICAL DATA:  Shaking chills since 2 a.m., vomiting, abdominal pain EXAM: CT ANGIOGRAPHY CHEST WITH CONTRAST TECHNIQUE: Multidetector  CT imaging of the chest was performed using the standard protocol during bolus administration of intravenous contrast. Multiplanar CT image reconstructions and MIPs were obtained to evaluate the vascular anatomy. CONTRAST:  59mL OMNIPAQUE IOHEXOL 350 MG/ML SOLN COMPARISON:  04/30/2020 FINDINGS: Cardiovascular: This is a technically adequate evaluation of the pulmonary vasculature. No filling defects or pulmonary emboli. The heart is enlarged without pericardial effusion. Normal caliber of the thoracic aorta, with moderate atherosclerosis. Mediastinum/Nodes: No enlarged mediastinal, hilar, or axillary lymph nodes. Thyroid gland, trachea, and esophagus demonstrate no significant findings. Small hiatal hernia. Lungs/Pleura: Bibasilar scarring and subpleural fibrosis. No acute airspace disease, effusion, or pneumothorax. Central airways are patent. Upper Abdomen: No acute abnormality. Musculoskeletal: No acute or destructive bony lesions. Reconstructed images demonstrate no additional findings. Review of the MIP images confirms the above findings. IMPRESSION: 1. No evidence of pulmonary embolus. 2. No acute airspace disease. 3. Small hiatal hernia. 4. Aortic Atherosclerosis (ICD10-I70.0). Electronically Signed   By: Randa Ngo M.D.   On: 04/30/2020 16:53   CT ABDOMEN PELVIS W  CONTRAST  Result Date: 04/30/2020 CLINICAL DATA:  Acute onset abdominal pain with vomiting. EXAM: CT ABDOMEN AND PELVIS WITH CONTRAST TECHNIQUE: Multidetector CT imaging of the abdomen and pelvis was performed using the standard protocol following bolus administration of intravenous contrast. CONTRAST:  19mL OMNIPAQUE IOHEXOL 300 MG/ML  SOLN COMPARISON:  06/18/2019. FINDINGS: Lower chest: Suspect mild basilar subpleural fibrosis. Heart is at the upper limits of normal in size. Atherosclerotic calcification of the aorta and coronary arteries. No pericardial or pleural effusion. Moderate hiatal hernia. Hepatobiliary: Well-circumscribed low-attenuation lesions in the liver measure up to 1.8 cm similar and likely cysts. Liver and gallbladder are otherwise unremarkable. No biliary ductal dilatation. Pancreas: Negative. Spleen: Negative. Adrenals/Urinary Tract: Adrenal glands are unremarkable. Low-attenuation lesions in the kidneys measure up to 4.9 cm on the right and are indicative of cysts. Ureters are decompressed. Bladder is grossly unremarkable. Stomach/Bowel: Moderate hiatal hernia. Stomach, small bowel, appendix and colon are unremarkable. Vascular/Lymphatic: Atherosclerotic calcification of the aorta. No pathologically enlarged lymph nodes. Reproductive: Prostate is mildly enlarged. Other: No free fluid.  Mesenteries and peritoneum are unremarkable. Musculoskeletal: Postoperative and degenerative changes in the spine. No worrisome lytic or sclerotic lesions. IMPRESSION: 1. No acute findings to explain the patient's clinical history. 2. 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 3. Moderate hiatal hernia. 4. Mildly enlarged prostate. 5. Aortic atherosclerosis (ICD10-I70.0). Coronary artery calcification. Electronically Signed   By: Lorin Picket M.D.   On: 04/30/2020 14:28   DG Chest Portable 1 View  Result Date: 04/30/2020 CLINICAL DATA:  Shortness of breath EXAM: PORTABLE CHEST 1 VIEW COMPARISON:   11/18/2013 FINDINGS: Heart size is unremarkable. There is a hiatal hernia. Aortic calcifications are noted. There is no pneumothorax or large pleural effusion. No acute osseous abnormality. IMPRESSION: No active disease. Electronically Signed   By: Constance Holster M.D.   On: 04/30/2020 15:38     Labs:   Basic Metabolic Panel: Recent Labs  Lab 04/30/20 1234 05/01/20 0402  NA 136 135  K 3.6 3.5  CL 100 108  CO2 25 20*  GLUCOSE 115* 88  BUN 21 17  CREATININE 1.42* 1.20  CALCIUM 8.7* 7.7*   GFR Estimated Creatinine Clearance: 43.5 mL/min (by C-G formula based on SCr of 1.2 mg/dL). Liver Function Tests: Recent Labs  Lab 04/30/20 1234 05/01/20 0402  AST 27 24  ALT 19 17  ALKPHOS 50 43  BILITOT 1.3* 0.9  PROT 6.9 5.5*  ALBUMIN 3.8  3.0*   Recent Labs  Lab 04/30/20 1234  LIPASE 23   No results for input(s): AMMONIA in the last 168 hours. Coagulation profile No results for input(s): INR, PROTIME in the last 168 hours.  CBC: Recent Labs  Lab 04/30/20 1234 05/01/20 0402  WBC 24.1* 14.8*  HGB 14.1 11.6*  HCT 41.3 35.0*  MCV 91.6 93.6  PLT 185 152   Cardiac Enzymes: No results for input(s): CKTOTAL, CKMB, CKMBINDEX, TROPONINI in the last 168 hours. BNP: Invalid input(s): POCBNP CBG: No results for input(s): GLUCAP in the last 168 hours. D-Dimer No results for input(s): DDIMER in the last 72 hours. Hgb A1c No results for input(s): HGBA1C in the last 72 hours. Lipid Profile No results for input(s): CHOL, HDL, LDLCALC, TRIG, CHOLHDL, LDLDIRECT in the last 72 hours. Thyroid function studies No results for input(s): TSH, T4TOTAL, T3FREE, THYROIDAB in the last 72 hours.  Invalid input(s): FREET3 Anemia work up No results for input(s): VITAMINB12, FOLATE, FERRITIN, TIBC, IRON, RETICCTPCT in the last 72 hours. Microbiology Recent Results (from the past 240 hour(s))  SARS CORONAVIRUS 2 (TAT 6-24 HRS) Nasopharyngeal Nasopharyngeal Swab     Status: None    Collection Time: 04/28/20  1:35 PM   Specimen: Nasopharyngeal Swab  Result Value Ref Range Status   SARS Coronavirus 2 NEGATIVE NEGATIVE Final    Comment: (NOTE) SARS-CoV-2 target nucleic acids are NOT DETECTED.  The SARS-CoV-2 RNA is generally detectable in upper and lower respiratory specimens during the acute phase of infection. Negative results do not preclude SARS-CoV-2 infection, do not rule out co-infections with other pathogens, and should not be used as the sole basis for treatment or other patient management decisions. Negative results must be combined with clinical observations, patient history, and epidemiological information. The expected result is Negative.  Fact Sheet for Patients: SugarRoll.be  Fact Sheet for Healthcare Providers: https://www.woods-mathews.com/  This test is not yet approved or cleared by the [place] FDA and  has been authorized for detection and/or diagnosis of SARS-CoV-2 by FDA under an Emergency Use Authorization (EUA). This EUA will remain  in effect (meaning this test can be used) for the duration of the COVID-19 declaration under Se ction 564(b)(1) of the Act, 21 U.S.C. section 360bbb-3(b)(1), unless the authorization is terminated or revoked sooner.  Performed at [facility], [place] [address], [place], [place] [address]   Resp Panel by RT-PCR (Flu A&B, Covid) Nasopharyngeal Swab     Status: None   Collection Time: 04/30/20  3:30 PM   Specimen: Nasopharyngeal Swab; Nasopharyngeal(NP) swabs in vial transport medium  Result Value Ref Range Status   SARS Coronavirus 2 by RT PCR NEGATIVE NEGATIVE Final    Comment: (NOTE) SARS-CoV-2 target nucleic acids are NOT DETECTED.  The SARS-CoV-2 RNA is generally detectable in upper respiratory specimens during the acute phase of infection. The lowest concentration of SARS-CoV-2 viral copies this assay can detect is 138 copies/mL. A negative result  does not preclude SARS-Cov-2 infection and should not be used as the sole basis for treatment or other patient management decisions. A negative result may occur with  improper specimen collection/handling, submission of specimen other than nasopharyngeal swab, presence of viral mutation(s) within the areas targeted by this assay, and inadequate number of viral copies(<138 copies/mL). A negative result must be combined with clinical observations, patient history, and epidemiological information. The expected result is Negative.  Fact Sheet for Patients:  EntrepreneurPulse.com.au  Fact Sheet for Healthcare Providers:  IncredibleEmployment.be  This  test is no t yet approved or cleared by the [place] and  has been authorized for detection and/or diagnosis of SARS-CoV-2 by FDA under an Emergency Use Authorization (EUA). This EUA will remain  in effect (meaning this test can be used) for the duration of the COVID-19 declaration under Section 564(b)(1) of the Act, 21 U.S.C.section 360bbb-3(b)(1), unless the authorization is terminated  or revoked sooner.       Influenza A by PCR NEGATIVE NEGATIVE Final   Influenza B by PCR NEGATIVE NEGATIVE Final    Comment: (NOTE) The Xpert Xpress SARS-CoV-2/FLU/RSV plus assay is intended as an aid in the diagnosis of influenza from Nasopharyngeal swab specimens and should not be used as a sole basis for treatment. Nasal washings and aspirates are unacceptable for Xpert Xpress SARS-CoV-2/FLU/RSV testing.  Fact Sheet for Patients: EntrepreneurPulse.com.au  Fact Sheet for Healthcare Providers: IncredibleEmployment.be  This test is not yet approved or cleared by the [place] FDA and has been authorized for detection and/or diagnosis of SARS-CoV-2 by FDA under an Emergency Use Authorization (EUA). This EUA will remain in effect (meaning this test can be used) for  the duration of the COVID-19 declaration under Section 564(b)(1) of the Act, 21 U.S.C. section 360bbb-3(b)(1), unless the authorization is terminated or revoked.  Performed at [facility], [place] [address] [place], [place] [address]      Signed: Marlowe Aschoff Dahal  [facility] 05/01/2020, 11:29 AM

## 2020-05-01 NOTE — Progress Notes (Signed)
Went over discharge instructions w/ pt and pt wife. Pt and wife verbalized understanding.

## 2020-05-02 ENCOUNTER — Inpatient Hospital Stay (HOSPITAL_COMMUNITY): Admission: RE | Admit: 2020-05-02 | Payer: Medicare Other | Source: Ambulatory Visit

## 2020-05-02 ENCOUNTER — Encounter (HOSPITAL_COMMUNITY): Payer: Self-pay

## 2020-05-05 LAB — CULTURE, BLOOD (ROUTINE X 2)
Culture: NO GROWTH
Culture: NO GROWTH
Special Requests: ADEQUATE
Special Requests: ADEQUATE

## 2020-05-07 DIAGNOSIS — R001 Bradycardia, unspecified: Secondary | ICD-10-CM | POA: Diagnosis not present

## 2020-05-08 ENCOUNTER — Telehealth (HOSPITAL_COMMUNITY): Payer: Self-pay | Admitting: *Deleted

## 2020-05-08 ENCOUNTER — Encounter (HOSPITAL_COMMUNITY): Payer: Medicare Other

## 2020-05-08 NOTE — Telephone Encounter (Signed)
Close encounter 

## 2020-05-10 ENCOUNTER — Other Ambulatory Visit: Payer: Self-pay

## 2020-05-10 ENCOUNTER — Ambulatory Visit (HOSPITAL_COMMUNITY)
Admission: RE | Admit: 2020-05-10 | Discharge: 2020-05-10 | Disposition: A | Discharge status: Home / Self Care | Payer: Medicare Other | Source: Ambulatory Visit | Attending: Internal Medicine | Admitting: Internal Medicine

## 2020-05-10 DIAGNOSIS — R079 Chest pain, unspecified: Secondary | ICD-10-CM | POA: Insufficient documentation

## 2020-05-10 LAB — MYOCARDIAL PERFUSION IMAGING
LV dias vol: 96 mL (ref 62–150)
LV sys vol: 50 mL
Peak HR: 74 {beats}/min
Rest HR: 45 {beats}/min
SDS: 3
SRS: 1
SSS: 4
TID: 0.92

## 2020-05-10 MED ORDER — TECHNETIUM TC 99M TETROFOSMIN IV KIT
30.4000 | PACK | Freq: Once | INTRAVENOUS | Status: AC | PRN
  Administered 2020-05-10: 30.4 via INTRAVENOUS
  Filled 2020-05-10: qty 31

## 2020-05-10 MED ORDER — REGADENOSON 0.4 MG/5ML IV SOLN
0.4000 mg | Freq: Once | INTRAVENOUS | Status: AC
  Administered 2020-05-10: 0.4 mg via INTRAVENOUS

## 2020-05-10 MED ORDER — TECHNETIUM TC 99M TETROFOSMIN IV KIT
10.7000 | PACK | Freq: Once | INTRAVENOUS | Status: AC | PRN
  Administered 2020-05-10: 10.7 via INTRAVENOUS
  Filled 2020-05-10: qty 11

## 2020-05-11 DIAGNOSIS — K219 Gastro-esophageal reflux disease without esophagitis: Secondary | ICD-10-CM | POA: Diagnosis not present

## 2020-05-11 DIAGNOSIS — J4520 Mild intermittent asthma, uncomplicated: Secondary | ICD-10-CM | POA: Diagnosis not present

## 2020-05-11 DIAGNOSIS — R109 Unspecified abdominal pain: Secondary | ICD-10-CM | POA: Diagnosis not present

## 2020-05-11 DIAGNOSIS — J4530 Mild persistent asthma, uncomplicated: Secondary | ICD-10-CM | POA: Diagnosis not present

## 2020-05-15 ENCOUNTER — Other Ambulatory Visit: Payer: Self-pay

## 2020-05-15 ENCOUNTER — Ambulatory Visit (HOSPITAL_COMMUNITY): Payer: Medicare Other | Attending: Cardiovascular Disease

## 2020-05-15 DIAGNOSIS — R079 Chest pain, unspecified: Secondary | ICD-10-CM | POA: Diagnosis not present

## 2020-05-15 LAB — ECHOCARDIOGRAM COMPLETE
Area-P 1/2: 2.6 cm2
S' Lateral: 2.8 cm

## 2020-06-07 DIAGNOSIS — L218 Other seborrheic dermatitis: Secondary | ICD-10-CM | POA: Diagnosis not present

## 2020-06-07 DIAGNOSIS — L570 Actinic keratosis: Secondary | ICD-10-CM | POA: Diagnosis not present

## 2020-06-07 DIAGNOSIS — Z85828 Personal history of other malignant neoplasm of skin: Secondary | ICD-10-CM | POA: Diagnosis not present

## 2020-06-07 DIAGNOSIS — R251 Tremor, unspecified: Secondary | ICD-10-CM | POA: Diagnosis not present

## 2020-06-07 DIAGNOSIS — L812 Freckles: Secondary | ICD-10-CM | POA: Diagnosis not present

## 2020-06-07 DIAGNOSIS — L821 Other seborrheic keratosis: Secondary | ICD-10-CM | POA: Diagnosis not present

## 2020-06-07 DIAGNOSIS — K219 Gastro-esophageal reflux disease without esophagitis: Secondary | ICD-10-CM | POA: Diagnosis not present

## 2020-06-07 DIAGNOSIS — J4530 Mild persistent asthma, uncomplicated: Secondary | ICD-10-CM | POA: Diagnosis not present

## 2020-06-07 DIAGNOSIS — D1801 Hemangioma of skin and subcutaneous tissue: Secondary | ICD-10-CM | POA: Diagnosis not present

## 2020-06-07 DIAGNOSIS — R490 Dysphonia: Secondary | ICD-10-CM | POA: Diagnosis not present

## 2020-06-25 ENCOUNTER — Ambulatory Visit: Payer: Medicare Other | Admitting: Cardiovascular Disease

## 2020-07-17 DIAGNOSIS — J3489 Other specified disorders of nose and nasal sinuses: Secondary | ICD-10-CM | POA: Diagnosis not present

## 2020-07-17 DIAGNOSIS — K219 Gastro-esophageal reflux disease without esophagitis: Secondary | ICD-10-CM | POA: Diagnosis not present

## 2020-07-18 ENCOUNTER — Ambulatory Visit: Payer: Medicare Other

## 2020-07-18 NOTE — Progress Notes (Signed)
   Covid-19 Vaccination Clinic  Name:  Alex Oconnor    MRN: [id number] DOB: 1933-01-21  07/18/2020  Alex Oconnor was observed post Covid-19 immunization for 15 minutes without incident. He was provided with Vaccine Information Sheet and instruction to access the V-Safe system.   Alex Oconnor was instructed to call 911 with any severe reactions post vaccine: Marland Kitchen Difficulty breathing  . Swelling of face and throat  . A fast heartbeat  . A bad rash all over body  . Dizziness and weakness

## 2020-07-23 NOTE — Progress Notes (Deleted)
Cardiology Office Note:   Date:  07/23/2020  NAME:  Alex Oconnor    MRN: [id number] DOB:  11/06/1932   PCP:  Lavone Orn, MD  Cardiologist:  None  Electrophysiologist:  None   Referring MD: Lavone Orn, MD   No chief complaint on file. ***  History of Present Illness:   Alex Oconnor is a 87 y.o. male with a hx of cognitive decline, HTN, non-obstructive CAD, TIA who presents for follow-up. Seen in February for R chest pain and fatigue. Normal echo. Normal stress. No bradycardia no monitor.   Problem List 1. CAD -PCI 1999 to RCA -LHC 2016: normal LAD/LCX, 20% RCA 2. Carotid artery disease s/p R endarterectomy  -1997 3. HTN 4. HLD -T chol 173, HDL 40, LDL 103, TG 178 5. TIA  Past Medical History: Past Medical History:  Diagnosis Date  . Arthritis   . Asthma   . Carotid artery occlusion   . Cataract of both eyes   . Complication of anesthesia    pts wife states BP drops; also had bout with pulse postop   . Coronary artery disease    stent placed 1999  . Dysrhythmia   . Gastroparesis   . GERD (gastroesophageal reflux disease)   . Hard of hearing   . Headache(784.0)    saw in ED 08/2006  . History of hiatal hernia   . Hyperlipemia   . Hypertension   . Irritable bowel syndrome   . Numbness    left arm pts wife states MD aware and no issues identified  . Rotator cuff disorder    left   . Sleep apnea    10/2001; does not use CPAP  . TIA (transient ischemic attack)    11/2011  . Urinary hesitancy   . Wears glasses     Past Surgical History: Past Surgical History:  Procedure Laterality Date  . abd ultrasound      03/2007-xray of abd;01/2008-ultrasound of abd;01/2009-ultrasound of abd  . BACK SURGERY     MRI lower back 09/2010;back injected 10/2010;11/2010 spinal lumbar fusion L4-5  . CARDIAC CATHETERIZATION     May 1999  . CARDIAC CATHETERIZATION N/A 08/23/2014   Procedure: Left Heart Cath and Coronary Angiography;  Surgeon: Belva Crome, MD;  Location:  [facility];  Service: Cardiovascular;  Laterality: N/A;  . CARDIOVASCULAR STRESS TEST     10/1999;09/2002  . CAROTID ENDARTERECTOMY     right side 1997  . COLONOSCOPY    . colonscopy     02/2007  . CORONARY STENT PLACEMENT    . eyelid surgery     removal of ingrown eyelashes  . GREEN LIGHT LASER TURP (TRANSURETHRAL RESECTION OF PROSTATE N/A 06/19/2015   Procedure: GREEN LIGHT LASER TURP (TRANSURETHRAL RESECTION OF PROSTATE;  Surgeon: Irine Seal, MD;  Location: [facility];  Service: Urology;  Laterality: N/A;  . HERNIA REPAIR     2001; inguinal - right   . HIATAL HERNIA REPAIR N/A 12/08/2013   Procedure: LAPAROSCOPIC REPAIR OF HIATAL HERNIA;  Surgeon: Pedro Earls, MD;  Location: [facility];  Service: General;  Laterality: N/A;  . injection of knees     06/2012 both knees injected;10/2013 both knees injected   . INSERTION OF MESH N/A 12/08/2013   Procedure: INSERTION OF MESH;  Surgeon: Pedro Earls, MD;  Location: [facility];  Service: General;  Laterality: N/A;  . KNEE ARTHROSCOPY     right knee 06/2002;12/2005-left knee  . right hip injected  08/2013  . TONSILLECTOMY      Current Medications: No outpatient medications have been marked as taking for the 07/25/20 encounter (Appointment) with O'Neal, Cassie Freer, MD.     Allergies:    Pantoprazole, Penicillins, Plavix [clopidogrel bisulfate], Ciprofloxacin, and Sulfa antibiotics   Social History: Social History   Socioeconomic History  . Marital status: Married    Spouse name: Not on file  . Number of children: 2  . Years of education: Not on file  . Highest education level: Not on file  Occupational History  . 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 Smoking status: Former Smoker    Packs/day: 2.00    Years: 20.00    Pack years: 40.00    Types: Cigarettes    Quit date: 11/24/1980    Years since quitting: 39.6  . Smokeless tobacco: Never Used  Substance and Sexual Activity  . Alcohol  use: Yes    Comment: past alcohol abuse quit 30 years ago   . Drug use: No  . Sexual activity: Not on file  Other Topics Concern  . Not on file  Social History Narrative  . Not on file   Social Determinants of Health   Financial Resource Strain: Not on file  Food Insecurity: Not on file  Transportation Needs: Not on file  Physical Activity: Not on file  Stress: Not on file  Social Connections: Not on file     Family History: The patient's ***family history includes Congestive Heart Failure in his brother.  ROS:   All other ROS reviewed and negative. Pertinent positives noted in the HPI.     EKGs/Labs/Other Studies Reviewed:   The following studies were personally reviewed by me today:  EKG:  EKG is *** ordered today.  The ekg ordered today demonstrates ***, and was personally reviewed by me.   TTE 05/15/2020  1. Left ventricular ejection fraction, by estimation, is 55 to 60%. The  left ventricle has normal function. The left ventricle has no regional  wall motion abnormalities. There is mild asymmetric left ventricular  hypertrophy. Left ventricular diastolic  parameters are consistent with Grade I diastolic dysfunction (impaired  relaxation).  2. Right ventricular systolic function is normal. The right ventricular  size is normal. There is normal pulmonary artery systolic pressure.  3. The mitral valve is normal in structure. Mild mitral valve  regurgitation.  4. The aortic valve is normal in structure. There is mild calcification  of the aortic valve. Aortic valve regurgitation is not visualized.  5. Aortic dilatation noted. There is mild dilatation of the ascending  aorta, measuring 41 mm.   NM Stress 05/10/2020  The left ventricular ejection fraction is mildly decreased (45-54%).  Nuclear stress EF: 48%.  There was no ST segment deviation noted during stress.  Defect 1: There is a small defect of moderate severity present in the basal inferoseptal and mid  inferoseptal location.  This is a low risk study.   Mildly abnormal, low risk stress nuclear study with inferoseptal thinning but no ischemia.  Gated ejection fraction 48% but visually appears better.  Wall motion appears to be normal.  Suggest echocardiogram to better assess LV function.  Zio 05/07/2020  Impression: 1. Average heart rate 57 bpm without evidence of significant bradycardia.  2. Brief ectopic atrial tachycardia episodes detected that did not coincide with symptoms (24 episodes, longest 12.8 seconds).  3. Rare ectopy.    Recent Labs: 05/01/2020: ALT 17; BUN 17; Creatinine, Ser  1.20; Hemoglobin 11.6; Platelets 152; Potassium 3.5; Sodium 135   Recent Lipid Panel    Component Value Date/Time   CHOL 103 12/07/2011 0715   TRIG 147 12/07/2011 0715   HDL 25 (L) 12/07/2011 0715   CHOLHDL 4.1 12/07/2011 0715   VLDL 29 12/07/2011 0715   LDLCALC 49 12/07/2011 0715    Physical Exam:   VS:  There were no vitals taken for this visit.   Wt Readings from Last 3 Encounters:  05/10/20 154 lb (69.9 kg)  04/19/20 154 lb (69.9 kg)  06/19/15 161 lb (73 kg)    General: Well nourished, well developed, in no acute distress Head: Atraumatic, normal size  Eyes: PEERLA, EOMI  Neck: Supple, no JVD Endocrine: No thryomegaly Cardiac: Normal S1, S2; RRR; no murmurs, rubs, or gallops Lungs: Clear to auscultation bilaterally, no wheezing, rhonchi or rales  Abd: Soft, nontender, no hepatomegaly  Ext: No edema, pulses 2+ Musculoskeletal: No deformities, BUE and BLE strength normal and equal Skin: Warm and dry, no rashes   Neuro: Alert and oriented to person, place, time, and situation, CNII-XII grossly intact, no focal deficits  Psych: Normal mood and affect   ASSESSMENT:   Alex Oconnor is a 87 y.o. male who presents for the following: No diagnosis found.  PLAN:   There are no diagnoses linked to this encounter.  Disposition: No follow-ups on file.  Medication Adjustments/Labs and  Tests Ordered: Current medicines are reviewed at length with the patient today.  Concerns regarding medicines are outlined above.  No orders of the defined types were placed in this encounter.  No orders of the defined types were placed in this encounter.   There are no Patient Instructions on file for this visit.   Time Spent with Patient: I have spent a total of *** minutes with patient reviewing hospital notes, telemetry, EKGs, labs and examining the patient as well as establishing an assessment and plan that was discussed with the patient.  > 50% of time was spent in direct patient care.  Signed, Addison Naegeli. Audie Box, MD, [place]  [address] [place], [place] [address] [phone]  07/23/2020 3:52 PM

## 2020-07-24 ENCOUNTER — Other Ambulatory Visit (HOSPITAL_BASED_OUTPATIENT_CLINIC_OR_DEPARTMENT_OTHER): Payer: Self-pay

## 2020-07-24 DIAGNOSIS — Z23 Encounter for immunization: Secondary | ICD-10-CM | POA: Diagnosis not present

## 2020-07-24 MED ORDER — PFIZER-BIONT COVID-19 VAC-TRIS 30 MCG/0.3ML IM SUSP
INTRAMUSCULAR | 0 refills | Status: DC
  Filled 2020-07-24: qty 0.3, 1d supply, fill #0

## 2020-07-25 ENCOUNTER — Telehealth: Payer: Self-pay | Admitting: Cardiovascular Disease

## 2020-07-25 ENCOUNTER — Ambulatory Visit: Payer: Medicare Other | Admitting: Cardiovascular Disease

## 2020-07-25 DIAGNOSIS — I2510 Atherosclerotic heart disease of native coronary artery without angina pectoris: Secondary | ICD-10-CM

## 2020-07-25 DIAGNOSIS — R001 Bradycardia, unspecified: Secondary | ICD-10-CM

## 2020-07-25 DIAGNOSIS — R079 Chest pain, unspecified: Secondary | ICD-10-CM

## 2020-07-25 DIAGNOSIS — R0602 Shortness of breath: Secondary | ICD-10-CM

## 2020-07-25 NOTE — Telephone Encounter (Signed)
Returned call to patient's wife (okay per DPR) who states that patient missed his appointment today due to going to the wrong office. Patients wife states that he was told that he could be squeezed in somewhere to be seen by Dr. Audie Box. Advised patients wife that I did not see anything open but that I would forward message to Dr. Audie Box and Nurse Almyra Free for review. Patients wife verbalized understanding.

## 2020-07-25 NOTE — Telephone Encounter (Signed)
Patient is scheduled for next week.  Wife notified. No questions or concerns.

## 2020-07-25 NOTE — Telephone Encounter (Signed)
Almyra Free will take care of this.   [place] T. Audie Box, MD, [place]  [address], [place], [place] [address] [phone]  3:04 PM

## 2020-07-25 NOTE — Telephone Encounter (Signed)
PT CALLED IN STATING THAT HE MISSED HIS APPT DUE TO GOING TO THE WRONG LOCATION AND WAS TOLD THAT HE WOULD BE ABLE TO BE SEEN NEXT Wednesday, NOT SHOWING ANY AVAILABILITY ON THE SCHEDULE, PLEASE REACH OUT TO PT TO SCHED IF THERE IS AN AVAILABLE WORK AROUND.

## 2020-07-26 ENCOUNTER — Other Ambulatory Visit (HOSPITAL_BASED_OUTPATIENT_CLINIC_OR_DEPARTMENT_OTHER): Payer: Self-pay

## 2020-07-29 NOTE — Progress Notes (Signed)
Cardiology Office Note:   Date:  08/01/2020  NAME:  Alex Oconnor    MRN: [id number] DOB:  Jun 14, 1932   PCP:  Lavone Orn, MD  Cardiologist:  None  Electrophysiologist:  None   Referring MD: Lavone Orn, MD   Chief Complaint  Patient presents with  . Follow-up   History of Present Illness:   Alex Oconnor is a 87 y.o. male with a hx of CAD, HTN, HLD, cognitive impairment who presents for follow-up. Seen in February for non-cardiac CP. Stress test normal. Echo normal. Monitor without significant bradycardia and brief non-significant A tach.  He presents with his wife.  Seems to be doing well.  Chest pain symptoms have resolved.  They are working with gastroenterology to determine if this is etiology.  This does not appear to be cardiac.  No palpitations reported.  No significant shortness of breath.  His wife informs me that cognitive decline is still an issue.  She reports that he can become anxious and get himself worked up.  Blood pressure will increase with this.  I do sympathize with her.  He is come off of benazepril.  BP 132/80.  Denies any symptoms.  Overall doing well.  Problem List 1. CAD -PCI 1999 to RCA -LHC 2016: normal LAD/LCX, 20% RCA -normal MPI 05/10/2020 2. Carotid artery disease s/p R endarterectomy  -1997 3. HTN 4. HLD -T chol 173, HDL 40, LDL 103, TG 178 5. TIA 6. Atrial tachycardia  -brief, longest 13 seconds on monitor  -did not coincide with symptoms  7.  Cognitive decline  Past Medical History: Past Medical History:  Diagnosis Date  . Arthritis   . Asthma   . Carotid artery occlusion   . Cataract of both eyes   . Complication of anesthesia    pts wife states BP drops; also had bout with pulse postop   . Coronary artery disease    stent placed 1999  . Dysrhythmia   . Gastroparesis   . GERD (gastroesophageal reflux disease)   . Hard of hearing   . Headache(784.0)    saw in ED 08/2006  . History of hiatal hernia   . Hyperlipemia   .  Hypertension   . Irritable bowel syndrome   . Numbness    left arm pts wife states MD aware and no issues identified  . Rotator cuff disorder    left   . Sleep apnea    10/2001; does not use CPAP  . TIA (transient ischemic attack)    11/2011  . Urinary hesitancy   . Wears glasses     Past Surgical History: Past Surgical History:  Procedure Laterality Date  . abd ultrasound      03/2007-xray of abd;01/2008-ultrasound of abd;01/2009-ultrasound of abd  . BACK SURGERY     MRI lower back 09/2010;back injected 10/2010;11/2010 spinal lumbar fusion L4-5  . CARDIAC CATHETERIZATION     May 1999  . CARDIAC CATHETERIZATION N/A 08/23/2014   Procedure: Left Heart Cath and Coronary Angiography;  Surgeon: Belva Crome, MD;  Location: [facility];  Service: Cardiovascular;  Laterality: N/A;  . CARDIOVASCULAR STRESS TEST     10/1999;09/2002  . CAROTID ENDARTERECTOMY     right side 1997  . COLONOSCOPY    . colonscopy     02/2007  . CORONARY STENT PLACEMENT    . eyelid surgery     removal of ingrown eyelashes  . GREEN LIGHT LASER TURP (TRANSURETHRAL RESECTION OF PROSTATE N/A 06/19/2015  Procedure: GREEN LIGHT LASER TURP (TRANSURETHRAL RESECTION OF PROSTATE;  Surgeon: Irine Seal, MD;  Location: [facility];  Service: Urology;  Laterality: N/A;  . HERNIA REPAIR     2001; inguinal - right   . HIATAL HERNIA REPAIR N/A 12/08/2013   Procedure: LAPAROSCOPIC REPAIR OF HIATAL HERNIA;  Surgeon: Pedro Earls, MD;  Location: [facility];  Service: General;  Laterality: N/A;  . injection of knees     06/2012 both knees injected;10/2013 both knees injected   . INSERTION OF MESH N/A 12/08/2013   Procedure: INSERTION OF MESH;  Surgeon: Pedro Earls, MD;  Location: [facility];  Service: General;  Laterality: N/A;  . KNEE ARTHROSCOPY     right knee 06/2002;12/2005-left knee  . right hip injected     08/2013  . TONSILLECTOMY      Current Medications: Current Meds  Medication Sig  . aspirin 81 MG tablet Take 81  mg by mouth daily.  . benazepril (LOTENSIN) 10 MG tablet Take 10 mg by mouth daily.  . budesonide (PULMICORT) 180 MCG/ACT inhaler Inhale 1 puff into the lungs daily as needed (wheezing).  . budesonide-formoterol (SYMBICORT) 160-4.5 MCG/ACT inhaler 2 puffs  . cholecalciferol (VITAMIN D) 1000 UNITS tablet Take 1,000 Units by mouth daily.  . Coenzyme Q10 100 MG capsule Take 100 mg by mouth daily.  Marland Kitchen COVID-19 mRNA Vac-TriS, Pfizer, (PFIZER-BIONT COVID-19 VAC-TRIS) SUSP injection Inject into the muscle.  Marland Kitchen COVID-19 mRNA vaccine, Pfizer, 30 MCG/0.3ML injection INJECT AS DIRECTED  . escitalopram (LEXAPRO) 10 MG tablet Take 1 tablet by mouth daily.  Marland Kitchen escitalopram (LEXAPRO) 10 MG tablet daily.  . fluticasone (FLONASE) 50 MCG/ACT nasal spray Place 1 spray into both nostrils daily as needed for allergies or rhinitis.  . fluticasone (FLOVENT HFA) 110 MCG/ACT inhaler Inhale 1 puff into the lungs daily as needed.  Marland Kitchen LORazepam (ATIVAN) 1 MG tablet Take 0.5 mg by mouth at bedtime.  Earney Navy Bicarbonate (ZEGERID) 20-1100 MG CAPS capsule Take 1 capsule by mouth daily before breakfast.  . Probiotic Product (PROBIOTIC PO) Take 1 tablet by mouth daily.  . saw palmetto 500 MG capsule Take 500 mg by mouth every morning.   . simvastatin (ZOCOR) 20 MG tablet Take 20 mg by mouth every morning.      Allergies:    Pantoprazole, Penicillins, Plavix [clopidogrel bisulfate], Ciprofloxacin, and Sulfa antibiotics   Social History: Social History   Socioeconomic History  . Marital status: Married    Spouse name: Not on file  . Number of children: 2  . Years of education: Not on file  . Highest education level: Not on file  Occupational History  . 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 Smoking status: Former Smoker    Packs/day: 2.00    Years: 20.00    Pack years: 40.00    Types: Cigarettes    Quit date: 11/24/1980    Years since quitting: 39.7  . Smokeless tobacco:  Never Used  Substance and Sexual Activity  . Alcohol use: Yes    Comment: past alcohol abuse quit 30 years ago   . Drug use: No  . Sexual activity: Not on file  Other Topics Concern  . Not on file  Social History Narrative  . Not on file   Social Determinants of Health   Financial Resource Strain: Not on file  Food Insecurity: Not on file  Transportation Needs: Not on file  Physical Activity: Not on file  Stress: Not on  file  Social Connections: Not on file     Family History: The patient's family history includes Congestive Heart Failure in his brother.  ROS:   All other ROS reviewed and negative. Pertinent positives noted in the HPI.     EKGs/Labs/Other Studies Reviewed:   The following studies were personally reviewed by me today:  TTE 05/15/2020 1. Left ventricular ejection fraction, by estimation, is 55 to 60%. The  left ventricle has normal function. The left ventricle has no regional  wall motion abnormalities. There is mild asymmetric left ventricular  hypertrophy. Left ventricular diastolic  parameters are consistent with Grade I diastolic dysfunction (impaired  relaxation).  2. Right ventricular systolic function is normal. The right ventricular  size is normal. There is normal pulmonary artery systolic pressure.  3. The mitral valve is normal in structure. Mild mitral valve  regurgitation.  4. The aortic valve is normal in structure. There is mild calcification  of the aortic valve. Aortic valve regurgitation is not visualized.  5. Aortic dilatation noted. There is mild dilatation of the ascending  aorta, measuring 41 mm.   Zio 05/13/2020 1. Average heart rate 57 bpm without evidence of significant bradycardia.  2. Brief ectopic atrial tachycardia episodes detected that did not coincide with symptoms (24 episodes, longest 12.8 seconds).  3. Rare ectopy.   Recent Labs: 05/01/2020: ALT 17; BUN 17; Creatinine, Ser 1.20; Hemoglobin 11.6; Platelets 152;  Potassium 3.5; Sodium 135   Recent Lipid Panel    Component Value Date/Time   CHOL 103 12/07/2011 0715   TRIG 147 12/07/2011 0715   HDL 25 (L) 12/07/2011 0715   CHOLHDL 4.1 12/07/2011 0715   VLDL 29 12/07/2011 0715   LDLCALC 49 12/07/2011 0715    Physical Exam:   VS:  BP 132/80   Pulse 67   Ht 5\' 8"  (1.727 m)   Wt 149 lb (67.6 kg)   SpO2 98%   BMI 22.66 kg/m    Wt Readings from Last 3 Encounters:  08/01/20 149 lb (67.6 kg)  05/10/20 154 lb (69.9 kg)  04/19/20 154 lb (69.9 kg)    General: Well nourished, well developed, in no acute distress Head: Atraumatic, normal size  Eyes: PEERLA, EOMI  Neck: Supple, no JVD Endocrine: No thryomegaly Cardiac: Normal S1, S2; RRR; no murmurs, rubs, or gallops Lungs: Clear to auscultation bilaterally, no wheezing, rhonchi or rales  Abd: Soft, nontender, no hepatomegaly  Ext: No edema, pulses 2+ Musculoskeletal: No deformities, BUE and BLE strength normal and equal Skin: Warm and dry, no rashes   Neuro: Alert and oriented to person, place, time, and situation, CNII-XII grossly intact, no focal deficits  Psych: Normal mood and affect   ASSESSMENT:   Alex Oconnor is a 87 y.o. male who presents for the following: 1. Chest pain, unspecified type   2. Coronary artery disease involving native coronary artery of native heart without angina pectoris   3. Bradycardia   4. Atrial tachycardia ([place])     PLAN:   1. Chest pain, unspecified type -Evaluated in March for constant atypical chest pain.  EKG was normal.  Echocardiogram normal.  Stress MPI normal.  No concerns for obstructive CAD.  I suspect this was gastrointestinal.  2. Coronary artery disease involving native coronary artery of native heart without angina pectoris -History of PCI to the RCA 1999.  Normal heart cath in 2016.  Stress test in March of this year normal. -Continue aspirin and statin.  LDL could be lower however  given his cognitive decline I see no need to change  medications.  3. Bradycardia -Recent monitor without significant bradycardia.  4. Atrial tachycardia ([place]) -Brief A. tach episodes on monitor.  No symptoms from this.  We will continue to monitor.  Disposition: Return in about 1 year (around 08/01/2021).  Medication Adjustments/Labs and Tests Ordered: Current medicines are reviewed at length with the patient today.  Concerns regarding medicines are outlined above.  No orders of the defined types were placed in this encounter.  No orders of the defined types were placed in this encounter.   Patient Instructions  Medication Instructions:  The current medical regimen is effective;  continue present plan and medications.  *If you need a refill on your cardiac medications before your next appointment, please call your pharmacy*   Follow-Up: At [facility], you and your health needs are our priority.  As part of our continuing mission to provide you with exceptional heart care, we have created designated Provider Care Teams.  These Care Teams include your primary Cardiologist (physician) and Advanced Practice Providers (APPs -  Physician Assistants and Nurse Practitioners) who all work together to provide you with the care you need, when you need it.  We recommend signing up for the patient portal called "MyChart".  Sign up information is provided on this After Visit Summary.  MyChart is used to connect with patients for Virtual Visits (Telemedicine).  Patients are able to view lab/test results, encounter notes, upcoming appointments, etc.  Non-urgent messages can be sent to your provider as well.   To learn more about what you can do with MyChart, go to [email or web address].    Your next appointment:   12 month(s)  The format for your next appointment:   In Person  Provider:   Eleonore Chiquito, MD       Time Spent with Patient: I have spent a total of 25 minutes with patient reviewing hospital notes, telemetry, EKGs, labs and  examining the patient as well as establishing an assessment and plan that was discussed with the patient.  > 50% of time was spent in direct patient care.  Signed, Addison Naegeli. Audie Box, MD, [place]  [address], [place], [place] [address] [phone]  08/01/2020 10:35 AM

## 2020-08-01 ENCOUNTER — Encounter: Payer: Self-pay | Admitting: Cardiovascular Disease

## 2020-08-01 ENCOUNTER — Other Ambulatory Visit: Payer: Self-pay

## 2020-08-01 ENCOUNTER — Ambulatory Visit (INDEPENDENT_AMBULATORY_CARE_PROVIDER_SITE_OTHER): Payer: Medicare Other | Admitting: Cardiovascular Disease

## 2020-08-01 VITALS — BP 132/80 | HR 67 | Ht 68.0 in | Wt 149.0 lb

## 2020-08-01 DIAGNOSIS — I2510 Atherosclerotic heart disease of native coronary artery without angina pectoris: Secondary | ICD-10-CM

## 2020-08-01 DIAGNOSIS — R001 Bradycardia, unspecified: Secondary | ICD-10-CM

## 2020-08-01 DIAGNOSIS — R079 Chest pain, unspecified: Secondary | ICD-10-CM | POA: Diagnosis not present

## 2020-08-01 DIAGNOSIS — I471 Supraventricular tachycardia: Secondary | ICD-10-CM

## 2020-08-01 NOTE — Patient Instructions (Signed)

## 2020-08-02 DIAGNOSIS — K3184 Gastroparesis: Secondary | ICD-10-CM | POA: Diagnosis not present

## 2020-08-02 DIAGNOSIS — R140 Abdominal distension (gaseous): Secondary | ICD-10-CM | POA: Diagnosis not present

## 2020-08-02 DIAGNOSIS — F411 Generalized anxiety disorder: Secondary | ICD-10-CM | POA: Diagnosis not present

## 2020-08-02 DIAGNOSIS — K219 Gastro-esophageal reflux disease without esophagitis: Secondary | ICD-10-CM | POA: Diagnosis not present

## 2020-08-02 DIAGNOSIS — J4530 Mild persistent asthma, uncomplicated: Secondary | ICD-10-CM | POA: Diagnosis not present

## 2020-08-07 ENCOUNTER — Other Ambulatory Visit: Payer: Self-pay | Admitting: Gastroenterology

## 2020-08-07 DIAGNOSIS — R490 Dysphonia: Secondary | ICD-10-CM | POA: Diagnosis not present

## 2020-08-07 DIAGNOSIS — R140 Abdominal distension (gaseous): Secondary | ICD-10-CM | POA: Diagnosis not present

## 2020-08-07 DIAGNOSIS — K449 Diaphragmatic hernia without obstruction or gangrene: Secondary | ICD-10-CM

## 2020-08-07 DIAGNOSIS — K219 Gastro-esophageal reflux disease without esophagitis: Secondary | ICD-10-CM

## 2020-08-23 ENCOUNTER — Ambulatory Visit
Admission: RE | Admit: 2020-08-23 | Discharge: 2020-08-23 | Disposition: A | Discharge status: Home / Self Care | Payer: Medicare Other | Source: Ambulatory Visit | Attending: Gastroenterology | Admitting: Gastroenterology

## 2020-08-23 DIAGNOSIS — K449 Diaphragmatic hernia without obstruction or gangrene: Secondary | ICD-10-CM

## 2020-08-23 DIAGNOSIS — K219 Gastro-esophageal reflux disease without esophagitis: Secondary | ICD-10-CM

## 2020-08-29 DIAGNOSIS — R490 Dysphonia: Secondary | ICD-10-CM | POA: Diagnosis not present

## 2020-08-29 DIAGNOSIS — K219 Gastro-esophageal reflux disease without esophagitis: Secondary | ICD-10-CM | POA: Diagnosis not present

## 2020-09-03 DIAGNOSIS — R262 Difficulty in walking, not elsewhere classified: Secondary | ICD-10-CM | POA: Diagnosis not present

## 2020-09-20 DIAGNOSIS — R269 Unspecified abnormalities of gait and mobility: Secondary | ICD-10-CM | POA: Diagnosis not present

## 2020-09-20 DIAGNOSIS — J4530 Mild persistent asthma, uncomplicated: Secondary | ICD-10-CM | POA: Diagnosis not present

## 2020-09-20 DIAGNOSIS — R140 Abdominal distension (gaseous): Secondary | ICD-10-CM | POA: Diagnosis not present

## 2020-09-20 DIAGNOSIS — K589 Irritable bowel syndrome without diarrhea: Secondary | ICD-10-CM | POA: Diagnosis not present

## 2020-09-20 DIAGNOSIS — I10 Essential (primary) hypertension: Secondary | ICD-10-CM | POA: Diagnosis not present

## 2020-09-20 DIAGNOSIS — K219 Gastro-esophageal reflux disease without esophagitis: Secondary | ICD-10-CM | POA: Diagnosis not present

## 2020-10-04 DIAGNOSIS — R262 Difficulty in walking, not elsewhere classified: Secondary | ICD-10-CM | POA: Diagnosis not present

## 2020-10-04 DIAGNOSIS — R269 Unspecified abnormalities of gait and mobility: Secondary | ICD-10-CM | POA: Diagnosis not present

## 2020-10-15 DIAGNOSIS — R269 Unspecified abnormalities of gait and mobility: Secondary | ICD-10-CM | POA: Diagnosis not present

## 2020-10-15 DIAGNOSIS — R262 Difficulty in walking, not elsewhere classified: Secondary | ICD-10-CM | POA: Diagnosis not present

## 2020-10-19 DIAGNOSIS — R262 Difficulty in walking, not elsewhere classified: Secondary | ICD-10-CM | POA: Diagnosis not present

## 2020-10-19 DIAGNOSIS — R269 Unspecified abnormalities of gait and mobility: Secondary | ICD-10-CM | POA: Diagnosis not present

## 2020-10-23 ENCOUNTER — Other Ambulatory Visit (HOSPITAL_BASED_OUTPATIENT_CLINIC_OR_DEPARTMENT_OTHER): Payer: Self-pay

## 2020-10-23 DIAGNOSIS — Z20822 Contact with and (suspected) exposure to covid-19: Secondary | ICD-10-CM | POA: Diagnosis not present

## 2020-10-24 DIAGNOSIS — G4700 Insomnia, unspecified: Secondary | ICD-10-CM | POA: Diagnosis not present

## 2020-10-24 DIAGNOSIS — G459 Transient cerebral ischemic attack, unspecified: Secondary | ICD-10-CM | POA: Diagnosis not present

## 2020-10-24 DIAGNOSIS — E782 Mixed hyperlipidemia: Secondary | ICD-10-CM | POA: Diagnosis not present

## 2020-10-24 DIAGNOSIS — K219 Gastro-esophageal reflux disease without esophagitis: Secondary | ICD-10-CM | POA: Diagnosis not present

## 2020-10-24 DIAGNOSIS — I2510 Atherosclerotic heart disease of native coronary artery without angina pectoris: Secondary | ICD-10-CM | POA: Diagnosis not present

## 2020-10-24 DIAGNOSIS — N400 Enlarged prostate without lower urinary tract symptoms: Secondary | ICD-10-CM | POA: Diagnosis not present

## 2020-10-24 DIAGNOSIS — I10 Essential (primary) hypertension: Secondary | ICD-10-CM | POA: Diagnosis not present

## 2020-10-24 DIAGNOSIS — J4520 Mild intermittent asthma, uncomplicated: Secondary | ICD-10-CM | POA: Diagnosis not present

## 2020-10-24 DIAGNOSIS — J4530 Mild persistent asthma, uncomplicated: Secondary | ICD-10-CM | POA: Diagnosis not present

## 2020-10-26 DIAGNOSIS — J4520 Mild intermittent asthma, uncomplicated: Secondary | ICD-10-CM | POA: Diagnosis not present

## 2020-10-26 DIAGNOSIS — E782 Mixed hyperlipidemia: Secondary | ICD-10-CM | POA: Diagnosis not present

## 2020-10-26 DIAGNOSIS — G459 Transient cerebral ischemic attack, unspecified: Secondary | ICD-10-CM | POA: Diagnosis not present

## 2020-10-26 DIAGNOSIS — I2510 Atherosclerotic heart disease of native coronary artery without angina pectoris: Secondary | ICD-10-CM | POA: Diagnosis not present

## 2020-10-26 DIAGNOSIS — J4530 Mild persistent asthma, uncomplicated: Secondary | ICD-10-CM | POA: Diagnosis not present

## 2020-10-26 DIAGNOSIS — G4700 Insomnia, unspecified: Secondary | ICD-10-CM | POA: Diagnosis not present

## 2020-10-26 DIAGNOSIS — I10 Essential (primary) hypertension: Secondary | ICD-10-CM | POA: Diagnosis not present

## 2020-10-26 DIAGNOSIS — K219 Gastro-esophageal reflux disease without esophagitis: Secondary | ICD-10-CM | POA: Diagnosis not present

## 2020-10-26 DIAGNOSIS — N400 Enlarged prostate without lower urinary tract symptoms: Secondary | ICD-10-CM | POA: Diagnosis not present

## 2020-11-04 ENCOUNTER — Telehealth: Payer: Medicare Other | Admitting: Nurse Practitioner

## 2020-11-04 DIAGNOSIS — U071 COVID-19: Secondary | ICD-10-CM | POA: Diagnosis not present

## 2020-11-04 MED ORDER — ALBUTEROL SULFATE HFA 108 (90 BASE) MCG/ACT IN AERS: 2.0000 | INHALATION_SPRAY | Freq: Four times a day (QID) | RESPIRATORY_TRACT | 0 refills | Status: AC | PRN

## 2020-11-04 MED ORDER — MOLNUPIRAVIR EUA 200MG CAPSULE: 4.0000 | ORAL_CAPSULE | Freq: Two times a day (BID) | ORAL | 0 refills | Status: AC

## 2020-11-04 NOTE — Progress Notes (Signed)
Virtual Visit Consent   Alex Oconnor, you are scheduled for a virtual visit with a [facility] provider today.     Just as with appointments in the office, your consent must be obtained to participate.  Your consent will be active for this visit and any virtual visit you may have with one of our providers in the next 365 days.     If you have a MyChart account, a copy of this consent can be sent to you electronically.  All virtual visits are billed to your insurance company just like a traditional visit in the office.    As this is a virtual visit, video technology does not allow for your provider to perform a traditional examination.  This may limit your provider's ability to fully assess your condition.  If your provider identifies any concerns that need to be evaluated in person or the need to arrange testing (such as labs, EKG, etc.), we will make arrangements to do so.     Although advances in technology are sophisticated, we cannot ensure that it will always work on either your end or our end.  If the connection with a video visit is poor, the visit may have to be switched to a telephone visit.  With either a video or telephone visit, we are not always able to ensure that we have a secure connection.     I need to obtain your verbal consent now.   Are you willing to proceed with your visit today? Yes   Alex Oconnor has provided verbal consent on 11/04/2020 for a virtual visit (video or telephone).   Reynolds Bowl, NP   Date: 11/04/2020 12:09 PM   Virtual Visit via Video Note   I, Reynolds Bowl, connected with  Alex Oconnor  ([id number], 04/17/1932) on 11/04/20 at  9:30 AM EDT by a video-enabled telemedicine application and verified that I am speaking with the correct person using two identifiers.  Location: Patient: Virtual Visit Location Patient: Home Provider: Virtual Visit Location Provider: Home office   I discussed the limitations of evaluation and management by  telemedicine and the availability of in person appointments. The patient expressed understanding and agreed to proceed.    History of Present Illness: Alex Oconnor is a 87 y.o. who identifies as a male who was assigned male at birth, and is being seen today for positive COVID test after a recent cruise. Symptoms started on 9/8. Complains of coughing, chest tightness, fever, HA, chills, SOB and wheezing. He has taken Tylenol for symptoms. Has received 2 vaccines and 2 boosters. Has an underlying history of asthma.   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   Bacterial overgrowth syndrome 12/06/2011   Hypertension 12/06/2011   H/O carotid artery stenosis 12/06/2011   CAD (coronary artery disease) 12/06/2011    Allergies:  Allergies  Allergen Reactions   Pantoprazole Rash   Penicillins Hives    Has patient had a PCN reaction causing immediate rash, facial/tongue/throat swelling, SOB or lightheadedness with hypotension: Yes Has patient had a PCN reaction  causing severe rash involving mucus membranes or skin necrosis: No Has patient had a PCN reaction that required hospitalization  Has patient had a PCN reaction occurring within the last 10 years: No If all of the above answers are "NO", then may proceed with Cephalosporin use.    Plavix [Clopidogrel Bisulfate] Other (See Comments)    Developed a taste disorder.   Ciprofloxacin Rash   Sulfa Antibiotics Itching and Rash   Medications:  Current Outpatient Medications:    albuterol (VENTOLIN HFA) 108 (90 Base) MCG/ACT inhaler, Inhale  2 puffs into the lungs every 6 (six) hours as needed for wheezing or shortness of breath., Disp: 8 g, Rfl: 0   aspirin 81 MG tablet, Take 81 mg by mouth daily., Disp: , Rfl:    benazepril (LOTENSIN) 10 MG tablet, Take 10 mg by mouth daily., Disp: , Rfl: 10   budesonide-formoterol (SYMBICORT) 160-4.5 MCG/ACT inhaler, 2 puffs, Disp: , Rfl:    cholecalciferol (VITAMIN D) 1000 UNITS tablet, Take 1,000 Units by mouth daily., Disp: , Rfl:    Coenzyme Q10 100 MG capsule, Take 100 mg by mouth daily., Disp: , Rfl:    escitalopram (LEXAPRO) 10 MG tablet, Take 1 tablet by mouth daily., Disp: , Rfl:    fluticasone (FLONASE) 50 MCG/ACT nasal spray, Place 1 spray into both nostrils daily as needed for allergies or rhinitis., Disp: , Rfl:    molnupiravir EUA 200 mg CAPS, Take 4 capsules (800 mg total) by mouth 2 (two) times daily for 5 days., Disp: 40 capsule, Rfl: 0   Omeprazole-Sodium Bicarbonate (ZEGERID) 20-1100 MG CAPS capsule, Take 1 capsule by mouth daily before breakfast., Disp: , Rfl:    Probiotic Product (PROBIOTIC PO), Take 1 tablet by mouth daily., Disp: , Rfl:    saw palmetto 500 MG capsule, Take 500 mg by mouth every morning. , Disp: , Rfl:    simvastatin (ZOCOR) 20 MG tablet, Take 20 mg by mouth every morning. , Disp: , Rfl:    budesonide (PULMICORT) 180 MCG/ACT inhaler, Inhale 1 puff into the lungs daily as needed (wheezing)., Disp: , Rfl:    COVID-19 mRNA Vac-TriS, Pfizer, (PFIZER-BIONT COVID-19 VAC-TRIS) SUSP injection, Inject into the muscle. (Patient not taking: Reported on 11/04/2020), Disp: 0.3 mL, Rfl: 0   COVID-19 mRNA vaccine, Pfizer, 30 MCG/0.3ML injection, INJECT AS DIRECTED (Patient not taking: Reported on 11/04/2020), Disp: .3 mL, Rfl: 0   escitalopram (LEXAPRO) 10 MG tablet, daily., Disp: , Rfl:    fluticasone (FLOVENT HFA) 110 MCG/ACT inhaler, Inhale 1 puff into the lungs daily as needed. (Patient not taking: Reported on 11/04/2020), Disp: , Rfl:    LORazepam (ATIVAN) 1 MG tablet, Take  0.5 mg by mouth at bedtime. (Patient not taking: Reported on 11/04/2020), Disp: , Rfl:   Observations/Objective: Patient is well-developed, well-nourished in no acute distress.  Resting comfortably at home.  Head is normocephalic, atraumatic.  No labored breathing. Coughing observed. Speaking in complete sentences. Speech is clear and coherent with logical content.  Patient is alert and oriented at baseline.    Assessment and Plan: 1. COVID-19 - molnupiravir EUA 200 mg CAPS; Take 4 capsules (800 mg total) by mouth 2 (two) times daily for 5 days.  Dispense: 40 capsule; Refill: 0 - albuterol (VENTOLIN HFA) 108 (90 Base) MCG/ACT inhaler; Inhale 2 puffs into the lungs every 6 (six) hours as needed for wheezing or shortness of breath.  Dispense: 8 g; Refill: 0  Will start antiviral for COVID symptoms. Last creatinine done in  March, 2022. Instructions provided for medication administration. Will also prescribe Albuterol for SOB, wheezing. Encouraged use of rescue inhaler during this time. Increase fluids, get plenty of rest. Patient was encouraged to follow-up with PCP this week. Go to ER if difficulty breathing, SOB, unable to speak in complete sentences.   Follow Up Instructions: I discussed the assessment and treatment plan with the patient. The patient was provided an opportunity to ask questions and all were answered. The patient agreed with the plan and demonstrated an understanding of the instructions.  A copy of instructions were sent to the patient via MyChart.  The patient was advised to call back or seek an in-person evaluation if the symptoms worsen or if the condition fails to improve as anticipated.  Time:  I spent 18 minutes with the patient via telehealth technology discussing the above problems/concerns.    Reynolds Bowl, NP

## 2020-11-04 NOTE — Patient Instructions (Addendum)
Alex Oconnor, thank you for joining Reynolds Bowl, NP for today's virtual visit.  While this provider is not your primary care provider (PCP), if your PCP is located in our provider database this encounter information will be shared with them immediately following your visit.  Consent: (Patient) Alex Oconnor provided verbal consent for this virtual visit at the beginning of the encounter.  Current Medications:  Current Outpatient Medications:    albuterol (VENTOLIN HFA) 108 (90 Base) MCG/ACT inhaler, Inhale 2 puffs into the lungs every 6 (six) hours as needed for wheezing or shortness of breath., Disp: 8 g, Rfl: 0   aspirin 81 MG tablet, Take 81 mg by mouth daily., Disp: , Rfl:    benazepril (LOTENSIN) 10 MG tablet, Take 10 mg by mouth daily., Disp: , Rfl: 10   budesonide-formoterol (SYMBICORT) 160-4.5 MCG/ACT inhaler, 2 puffs, Disp: , Rfl:    cholecalciferol (VITAMIN D) 1000 UNITS tablet, Take 1,000 Units by mouth daily., Disp: , Rfl:    Coenzyme Q10 100 MG capsule, Take 100 mg by mouth daily., Disp: , Rfl:    escitalopram (LEXAPRO) 10 MG tablet, Take 1 tablet by mouth daily., Disp: , Rfl:    fluticasone (FLONASE) 50 MCG/ACT nasal spray, Place 1 spray into both nostrils daily as needed for allergies or rhinitis., Disp: , Rfl:    molnupiravir EUA 200 mg CAPS, Take 4 capsules (800 mg total) by mouth 2 (two) times daily for 5 days., Disp: 40 capsule, Rfl: 0   Omeprazole-Sodium Bicarbonate (ZEGERID) 20-1100 MG CAPS capsule, Take 1 capsule by mouth daily before breakfast., Disp: , Rfl:    Probiotic Product (PROBIOTIC PO), Take 1 tablet by mouth daily., Disp: , Rfl:    saw palmetto 500 MG capsule, Take 500 mg by mouth every morning. , Disp: , Rfl:    simvastatin (ZOCOR) 20 MG tablet, Take 20 mg by mouth every morning. , Disp: , Rfl:    budesonide (PULMICORT) 180 MCG/ACT inhaler, Inhale 1 puff into the lungs daily as needed (wheezing)., Disp: , Rfl:    COVID-19 mRNA Vac-TriS, Pfizer,  (PFIZER-BIONT COVID-19 VAC-TRIS) SUSP injection, Inject into the muscle. (Patient not taking: Reported on 11/04/2020), Disp: 0.3 mL, Rfl: 0   COVID-19 mRNA vaccine, Pfizer, 30 MCG/0.3ML injection, INJECT AS DIRECTED (Patient not taking: Reported on 11/04/2020), Disp: .3 mL, Rfl: 0   escitalopram (LEXAPRO) 10 MG tablet, daily., Disp: , Rfl:    fluticasone (FLOVENT HFA) 110 MCG/ACT inhaler, Inhale 1 puff into the lungs daily as needed. (Patient not taking: Reported on 11/04/2020), Disp: , Rfl:    LORazepam (ATIVAN) 1 MG tablet, Take 0.5 mg by mouth at bedtime. (Patient not taking: Reported on 11/04/2020), Disp: , Rfl:    Medications ordered in this encounter:  Meds ordered this encounter  Medications   molnupiravir EUA 200 mg CAPS    Sig: Take 4 capsules (800 mg total) by mouth 2 (two) times daily for 5 days.    Dispense:  40 capsule    Refill:  0   albuterol (VENTOLIN HFA) 108 (90 Base) MCG/ACT inhaler    Sig: Inhale 2 puffs into the lungs every 6 (six) hours as needed for wheezing or shortness of breath.    Dispense:  8 g    Refill:  0     *If you need refills on other medications prior to your next appointment, please contact your pharmacy*  Follow-Up: Call back or seek an in-person evaluation if the symptoms worsen or if the condition  fails to improve as anticipated.  Other Instructions  Will start antiviral for COVID symptoms. Use rescue inhaler during this time for SOB, wheezing and cough. Increase fluids, get plenty of rest. Follow-up with PCP this week. Go to ER if difficulty breathing, SOB, unable to speak in complete sentences.   If you have been instructed to have an in-person evaluation today at a local Urgent Care facility, please use the link below. It will take you to a list of all of our available [facility], including address, phone number and hours of operation. Please do not delay care.  [facility]  If you or a family member do not have a  primary care provider, use the link below to schedule a visit and establish care. When you choose a [facility] primary care physician or advanced practice provider, you gain a long-term partner in health. Find a Primary Care Provider  Learn more about [facility] in-office and virtual care options: [facility]

## 2020-12-03 DIAGNOSIS — Z23 Encounter for immunization: Secondary | ICD-10-CM | POA: Diagnosis not present

## 2020-12-06 DIAGNOSIS — R109 Unspecified abdominal pain: Secondary | ICD-10-CM | POA: Diagnosis not present

## 2020-12-06 DIAGNOSIS — R140 Abdominal distension (gaseous): Secondary | ICD-10-CM | POA: Diagnosis not present

## 2020-12-06 DIAGNOSIS — K6389 Other specified diseases of intestine: Secondary | ICD-10-CM | POA: Diagnosis not present

## 2020-12-10 DIAGNOSIS — R109 Unspecified abdominal pain: Secondary | ICD-10-CM | POA: Diagnosis not present

## 2020-12-10 DIAGNOSIS — K6389 Other specified diseases of intestine: Secondary | ICD-10-CM | POA: Diagnosis not present

## 2020-12-10 DIAGNOSIS — R140 Abdominal distension (gaseous): Secondary | ICD-10-CM | POA: Diagnosis not present

## 2020-12-11 DIAGNOSIS — L853 Xerosis cutis: Secondary | ICD-10-CM | POA: Diagnosis not present

## 2020-12-11 DIAGNOSIS — L812 Freckles: Secondary | ICD-10-CM | POA: Diagnosis not present

## 2020-12-11 DIAGNOSIS — L821 Other seborrheic keratosis: Secondary | ICD-10-CM | POA: Diagnosis not present

## 2020-12-11 DIAGNOSIS — L820 Inflamed seborrheic keratosis: Secondary | ICD-10-CM | POA: Diagnosis not present

## 2020-12-11 DIAGNOSIS — Z85828 Personal history of other malignant neoplasm of skin: Secondary | ICD-10-CM | POA: Diagnosis not present

## 2020-12-11 DIAGNOSIS — D692 Other nonthrombocytopenic purpura: Secondary | ICD-10-CM | POA: Diagnosis not present

## 2020-12-11 DIAGNOSIS — D1801 Hemangioma of skin and subcutaneous tissue: Secondary | ICD-10-CM | POA: Diagnosis not present

## 2020-12-27 DIAGNOSIS — M1711 Unilateral primary osteoarthritis, right knee: Secondary | ICD-10-CM | POA: Diagnosis not present

## 2020-12-28 DIAGNOSIS — R140 Abdominal distension (gaseous): Secondary | ICD-10-CM | POA: Diagnosis not present

## 2020-12-28 DIAGNOSIS — K219 Gastro-esophageal reflux disease without esophagitis: Secondary | ICD-10-CM | POA: Diagnosis not present

## 2021-01-31 DIAGNOSIS — K219 Gastro-esophageal reflux disease without esophagitis: Secondary | ICD-10-CM | POA: Diagnosis not present

## 2021-01-31 DIAGNOSIS — I10 Essential (primary) hypertension: Secondary | ICD-10-CM | POA: Diagnosis not present

## 2021-01-31 DIAGNOSIS — J4530 Mild persistent asthma, uncomplicated: Secondary | ICD-10-CM | POA: Diagnosis not present

## 2021-01-31 DIAGNOSIS — G4700 Insomnia, unspecified: Secondary | ICD-10-CM | POA: Diagnosis not present

## 2021-01-31 DIAGNOSIS — J4520 Mild intermittent asthma, uncomplicated: Secondary | ICD-10-CM | POA: Diagnosis not present

## 2021-01-31 DIAGNOSIS — E782 Mixed hyperlipidemia: Secondary | ICD-10-CM | POA: Diagnosis not present

## 2021-02-06 ENCOUNTER — Ambulatory Visit: Payer: Medicare Other | Attending: Internal Medicine

## 2021-02-06 ENCOUNTER — Other Ambulatory Visit (HOSPITAL_BASED_OUTPATIENT_CLINIC_OR_DEPARTMENT_OTHER): Payer: Self-pay

## 2021-02-06 DIAGNOSIS — Z23 Encounter for immunization: Secondary | ICD-10-CM | POA: Diagnosis not present

## 2021-02-06 MED ORDER — PFIZER COVID-19 VAC BIVALENT 30 MCG/0.3ML IM SUSP
INTRAMUSCULAR | 0 refills | Status: DC
  Filled 2021-02-06: qty 0.3, 1d supply, fill #0

## 2021-02-06 NOTE — Progress Notes (Signed)
° °  Covid-19 Vaccination Clinic  Name:  AMIRE LEAZER    MRN: [id number] DOB: 1932/10/04  02/06/2021  Mr. Greathouse was observed post Covid-19 immunization for 15 minutes without incident. He was provided with Vaccine Information Sheet and instruction to access the V-Safe system.   Mr. Pangborn was instructed to call 911 with any severe reactions post vaccine: Difficulty breathing  Swelling of face and throat  A fast heartbeat  A bad rash all over body  Dizziness and weakness   Immunizations Administered     Name Date Dose VIS Date Route   Pfizer Covid-19 Vaccine Bivalent Booster 02/06/2021  9:58 AM 0.3 mL 10/24/2020 Intramuscular   Manufacturer: [employer]   Lot: [id number]   [place]: [place], PharmD, MBA Clinical Acute Care Pharmacist

## 2021-02-11 ENCOUNTER — Ambulatory Visit: Payer: Medicare Other

## 2021-02-11 ENCOUNTER — Ambulatory Visit (INDEPENDENT_AMBULATORY_CARE_PROVIDER_SITE_OTHER): Payer: Medicare Other

## 2021-02-11 ENCOUNTER — Ambulatory Visit (INDEPENDENT_AMBULATORY_CARE_PROVIDER_SITE_OTHER): Payer: Medicare Other | Admitting: Podiatry

## 2021-02-11 ENCOUNTER — Encounter: Payer: Self-pay | Admitting: Podiatry

## 2021-02-11 ENCOUNTER — Other Ambulatory Visit: Payer: Self-pay

## 2021-02-11 DIAGNOSIS — M79674 Pain in right toe(s): Secondary | ICD-10-CM

## 2021-02-11 DIAGNOSIS — I2510 Atherosclerotic heart disease of native coronary artery without angina pectoris: Secondary | ICD-10-CM

## 2021-02-11 DIAGNOSIS — D169 Benign neoplasm of bone and articular cartilage, unspecified: Secondary | ICD-10-CM | POA: Diagnosis not present

## 2021-02-11 DIAGNOSIS — M2041 Other hammer toe(s) (acquired), right foot: Secondary | ICD-10-CM | POA: Diagnosis not present

## 2021-02-11 DIAGNOSIS — L84 Corns and callosities: Secondary | ICD-10-CM | POA: Diagnosis not present

## 2021-02-11 NOTE — Progress Notes (Signed)
Subjective:   Patient ID: Alex Oconnor, male   DOB: 87 y.o.   MRN: [id number]   HPI F2 patient presents stating that the right second toe has developed a corn its been painful and he notices he has structural deformity.  States that its been more recent that this is happened he is tried wider shoes padding without relief of symptoms and it is gradually becoming more of an issue for him.  Patient does not smoke likes to be active   Review of Systems  All other systems reviewed and are negative.      Objective:  Physical Exam Vitals and nursing note reviewed.  Constitutional:      Appearance: He is well-developed.  Pulmonary:     Effort: Pulmonary effort is normal.  Musculoskeletal:        General: Normal range of motion.  Skin:    General: Skin is warm.  Neurological:     Mental Status: He is alert.    Neurovascular status was found to be intact muscle strength was found to be adequate with patient found to have irritation of the right second toe on the medial side with keratotic lesion painful when pressed which makes shoe gear difficult with structural bunion deformity and digital deformity between the hallux and second toe.  Good digital perfusion well oriented x3     Assessment:  Number toe deformity digit to right along with structural bunion deformity and keratotic lesion secondary to pressure     Plan:  H&P condition reviewed and I debrided lesion applied cushion and sleeve around the toe.  I explained the possibility for surgery at 1 point in future but at this point I recommend conservative care consisting of trimming as needed padding and wider shoe gear.  Educated him on surgical intervention that could be necessary but again I would recommend we not consider this at this time  X-rays indicate that there is moderate bunion deformity pressure between the hallux and second toe right foot

## 2021-02-13 ENCOUNTER — Other Ambulatory Visit: Payer: Self-pay | Admitting: Podiatry

## 2021-02-13 DIAGNOSIS — M2041 Other hammer toe(s) (acquired), right foot: Secondary | ICD-10-CM

## 2021-03-08 ENCOUNTER — Ambulatory Visit (INDEPENDENT_AMBULATORY_CARE_PROVIDER_SITE_OTHER): Payer: Medicare Other | Admitting: Podiatry

## 2021-03-08 ENCOUNTER — Encounter: Payer: Self-pay | Admitting: Podiatry

## 2021-03-08 ENCOUNTER — Other Ambulatory Visit: Payer: Self-pay

## 2021-03-08 DIAGNOSIS — M2041 Other hammer toe(s) (acquired), right foot: Secondary | ICD-10-CM | POA: Diagnosis not present

## 2021-03-08 DIAGNOSIS — M7751 Other enthesopathy of right foot: Secondary | ICD-10-CM

## 2021-03-08 MED ORDER — TRIAMCINOLONE ACETONIDE 10 MG/ML IJ SUSP
10.0000 mg | Freq: Once | INTRAMUSCULAR | Status: AC
Start: 2021-03-08 — End: 2021-03-08
  Administered 2021-03-08: 10 mg

## 2021-03-08 NOTE — Progress Notes (Signed)
Subjective:   Patient ID: Alex Oconnor, male   DOB: 88 y.o.   MRN: [id number]   HPI Patient states the second toe right has remained sore and inflamed and still hard to wear shoe gear   ROS      Objective:  Physical Exam  Neurovascular status intact with digital deformity digit 2 and 3 right with moderate structural deviation of the digits and inflammation fluid of the inner phalangeal joint medial side digit to right painful when pressed     Assessment:  Hammertoe deformities with structural bone changes along with inflammatory capsulitis digit to right     Plan:  H&P reviewed condition at this point I will get a focus on the inflammation I did careful sterile prep injected with 2 mg Dexasone Kenalog debrided lesion applied padding then discussed hammertoe correction which may be necessary in future.  I advised on removal of bone that we may need to do and I educated him on what would be required along with caregiver

## 2021-03-28 DIAGNOSIS — R7301 Impaired fasting glucose: Secondary | ICD-10-CM | POA: Diagnosis not present

## 2021-03-28 DIAGNOSIS — K589 Irritable bowel syndrome without diarrhea: Secondary | ICD-10-CM | POA: Diagnosis not present

## 2021-03-28 DIAGNOSIS — N400 Enlarged prostate without lower urinary tract symptoms: Secondary | ICD-10-CM | POA: Diagnosis not present

## 2021-03-28 DIAGNOSIS — R140 Abdominal distension (gaseous): Secondary | ICD-10-CM | POA: Diagnosis not present

## 2021-03-28 DIAGNOSIS — K219 Gastro-esophageal reflux disease without esophagitis: Secondary | ICD-10-CM | POA: Diagnosis not present

## 2021-03-28 DIAGNOSIS — Z1389 Encounter for screening for other disorder: Secondary | ICD-10-CM | POA: Diagnosis not present

## 2021-03-28 DIAGNOSIS — I2510 Atherosclerotic heart disease of native coronary artery without angina pectoris: Secondary | ICD-10-CM | POA: Diagnosis not present

## 2021-03-28 DIAGNOSIS — Z0000 Encounter for general adult medical examination without abnormal findings: Secondary | ICD-10-CM | POA: Diagnosis not present

## 2021-03-28 DIAGNOSIS — J4530 Mild persistent asthma, uncomplicated: Secondary | ICD-10-CM | POA: Diagnosis not present

## 2021-03-28 DIAGNOSIS — I10 Essential (primary) hypertension: Secondary | ICD-10-CM | POA: Diagnosis not present

## 2021-03-28 DIAGNOSIS — E782 Mixed hyperlipidemia: Secondary | ICD-10-CM | POA: Diagnosis not present

## 2021-04-01 DIAGNOSIS — R1013 Epigastric pain: Secondary | ICD-10-CM | POA: Diagnosis not present

## 2021-04-23 DIAGNOSIS — Z87891 Personal history of nicotine dependence: Secondary | ICD-10-CM | POA: Diagnosis not present

## 2021-04-23 DIAGNOSIS — H903 Sensorineural hearing loss, bilateral: Secondary | ICD-10-CM | POA: Diagnosis not present

## 2021-04-23 DIAGNOSIS — H6123 Impacted cerumen, bilateral: Secondary | ICD-10-CM | POA: Diagnosis not present

## 2021-04-23 DIAGNOSIS — R198 Other specified symptoms and signs involving the digestive system and abdomen: Secondary | ICD-10-CM | POA: Diagnosis not present

## 2021-05-01 DIAGNOSIS — K3189 Other diseases of stomach and duodenum: Secondary | ICD-10-CM | POA: Diagnosis not present

## 2021-05-01 DIAGNOSIS — Z8719 Personal history of other diseases of the digestive system: Secondary | ICD-10-CM | POA: Diagnosis not present

## 2021-05-20 DIAGNOSIS — H903 Sensorineural hearing loss, bilateral: Secondary | ICD-10-CM | POA: Diagnosis not present

## 2021-06-18 DIAGNOSIS — K589 Irritable bowel syndrome without diarrhea: Secondary | ICD-10-CM | POA: Diagnosis not present

## 2021-06-18 DIAGNOSIS — I10 Essential (primary) hypertension: Secondary | ICD-10-CM | POA: Diagnosis not present

## 2021-06-18 DIAGNOSIS — Z111 Encounter for screening for respiratory tuberculosis: Secondary | ICD-10-CM | POA: Diagnosis not present

## 2021-06-18 DIAGNOSIS — J4520 Mild intermittent asthma, uncomplicated: Secondary | ICD-10-CM | POA: Diagnosis not present

## 2021-06-24 DIAGNOSIS — D485 Neoplasm of uncertain behavior of skin: Secondary | ICD-10-CM | POA: Diagnosis not present

## 2021-06-24 DIAGNOSIS — D034 Melanoma in situ of scalp and neck: Secondary | ICD-10-CM | POA: Diagnosis not present

## 2021-06-24 DIAGNOSIS — L821 Other seborrheic keratosis: Secondary | ICD-10-CM | POA: Diagnosis not present

## 2021-06-24 DIAGNOSIS — D692 Other nonthrombocytopenic purpura: Secondary | ICD-10-CM | POA: Diagnosis not present

## 2021-06-24 DIAGNOSIS — Z85828 Personal history of other malignant neoplasm of skin: Secondary | ICD-10-CM | POA: Diagnosis not present

## 2021-07-03 DIAGNOSIS — R7989 Other specified abnormal findings of blood chemistry: Secondary | ICD-10-CM | POA: Diagnosis not present

## 2021-07-03 DIAGNOSIS — M546 Pain in thoracic spine: Secondary | ICD-10-CM | POA: Diagnosis not present

## 2021-07-24 DIAGNOSIS — D034 Melanoma in situ of scalp and neck: Secondary | ICD-10-CM | POA: Diagnosis not present

## 2021-07-24 DIAGNOSIS — Z85828 Personal history of other malignant neoplasm of skin: Secondary | ICD-10-CM | POA: Diagnosis not present

## 2021-09-23 DIAGNOSIS — J4530 Mild persistent asthma, uncomplicated: Secondary | ICD-10-CM | POA: Diagnosis not present

## 2021-09-23 DIAGNOSIS — K589 Irritable bowel syndrome without diarrhea: Secondary | ICD-10-CM | POA: Diagnosis not present

## 2021-09-23 DIAGNOSIS — I10 Essential (primary) hypertension: Secondary | ICD-10-CM | POA: Diagnosis not present

## 2021-09-23 DIAGNOSIS — F439 Reaction to severe stress, unspecified: Secondary | ICD-10-CM | POA: Diagnosis not present

## 2021-10-09 DIAGNOSIS — R519 Headache, unspecified: Secondary | ICD-10-CM | POA: Diagnosis not present

## 2021-10-09 DIAGNOSIS — R2689 Other abnormalities of gait and mobility: Secondary | ICD-10-CM | POA: Diagnosis not present

## 2021-10-10 ENCOUNTER — Other Ambulatory Visit: Payer: Self-pay | Admitting: Internal Medicine

## 2021-10-10 DIAGNOSIS — K219 Gastro-esophageal reflux disease without esophagitis: Secondary | ICD-10-CM | POA: Diagnosis not present

## 2021-10-10 DIAGNOSIS — I2510 Atherosclerotic heart disease of native coronary artery without angina pectoris: Secondary | ICD-10-CM | POA: Diagnosis not present

## 2021-10-10 DIAGNOSIS — R2689 Other abnormalities of gait and mobility: Secondary | ICD-10-CM

## 2021-10-10 DIAGNOSIS — I10 Essential (primary) hypertension: Secondary | ICD-10-CM | POA: Diagnosis not present

## 2021-10-10 DIAGNOSIS — J4530 Mild persistent asthma, uncomplicated: Secondary | ICD-10-CM | POA: Diagnosis not present

## 2021-10-10 DIAGNOSIS — E782 Mixed hyperlipidemia: Secondary | ICD-10-CM | POA: Diagnosis not present

## 2021-10-23 DIAGNOSIS — R278 Other lack of coordination: Secondary | ICD-10-CM | POA: Diagnosis not present

## 2021-10-23 DIAGNOSIS — R2689 Other abnormalities of gait and mobility: Secondary | ICD-10-CM | POA: Diagnosis not present

## 2021-10-24 ENCOUNTER — Ambulatory Visit
Admission: RE | Admit: 2021-10-24 | Discharge: 2021-10-24 | Disposition: A | Discharge status: Home / Self Care | Payer: Medicare Other | Source: Ambulatory Visit | Attending: Internal Medicine | Admitting: Internal Medicine

## 2021-10-24 DIAGNOSIS — J341 Cyst and mucocele of nose and nasal sinus: Secondary | ICD-10-CM | POA: Diagnosis not present

## 2021-10-24 DIAGNOSIS — J329 Chronic sinusitis, unspecified: Secondary | ICD-10-CM | POA: Diagnosis not present

## 2021-10-24 DIAGNOSIS — R2689 Other abnormalities of gait and mobility: Secondary | ICD-10-CM

## 2021-10-24 DIAGNOSIS — G319 Degenerative disease of nervous system, unspecified: Secondary | ICD-10-CM | POA: Diagnosis not present

## 2021-10-24 DIAGNOSIS — R42 Dizziness and giddiness: Secondary | ICD-10-CM | POA: Diagnosis not present

## 2021-10-24 MED ORDER — GADOBENATE DIMEGLUMINE 529 MG/ML IV SOLN
14.0000 mL | Freq: Once | INTRAVENOUS | Status: AC | PRN
  Administered 2021-10-24: 14 mL via INTRAVENOUS

## 2021-10-26 DIAGNOSIS — H01021 Squamous blepharitis right upper eyelid: Secondary | ICD-10-CM | POA: Diagnosis not present

## 2021-10-28 DIAGNOSIS — R278 Other lack of coordination: Secondary | ICD-10-CM | POA: Diagnosis not present

## 2021-10-28 DIAGNOSIS — R2689 Other abnormalities of gait and mobility: Secondary | ICD-10-CM | POA: Diagnosis not present

## 2021-10-29 DIAGNOSIS — H9313 Tinnitus, bilateral: Secondary | ICD-10-CM | POA: Diagnosis not present

## 2021-10-29 DIAGNOSIS — R42 Dizziness and giddiness: Secondary | ICD-10-CM | POA: Diagnosis not present

## 2021-10-29 DIAGNOSIS — R519 Headache, unspecified: Secondary | ICD-10-CM | POA: Diagnosis not present

## 2021-10-29 DIAGNOSIS — G8929 Other chronic pain: Secondary | ICD-10-CM | POA: Diagnosis not present

## 2021-10-29 DIAGNOSIS — H9193 Unspecified hearing loss, bilateral: Secondary | ICD-10-CM | POA: Diagnosis not present

## 2021-10-31 DIAGNOSIS — R2689 Other abnormalities of gait and mobility: Secondary | ICD-10-CM | POA: Diagnosis not present

## 2021-10-31 DIAGNOSIS — R278 Other lack of coordination: Secondary | ICD-10-CM | POA: Diagnosis not present

## 2021-11-05 DIAGNOSIS — R278 Other lack of coordination: Secondary | ICD-10-CM | POA: Diagnosis not present

## 2021-11-05 DIAGNOSIS — R2689 Other abnormalities of gait and mobility: Secondary | ICD-10-CM | POA: Diagnosis not present

## 2021-11-07 DIAGNOSIS — R278 Other lack of coordination: Secondary | ICD-10-CM | POA: Diagnosis not present

## 2021-11-07 DIAGNOSIS — R2689 Other abnormalities of gait and mobility: Secondary | ICD-10-CM | POA: Diagnosis not present

## 2021-11-12 DIAGNOSIS — R2689 Other abnormalities of gait and mobility: Secondary | ICD-10-CM | POA: Diagnosis not present

## 2021-11-12 DIAGNOSIS — R278 Other lack of coordination: Secondary | ICD-10-CM | POA: Diagnosis not present

## 2021-11-14 DIAGNOSIS — R2689 Other abnormalities of gait and mobility: Secondary | ICD-10-CM | POA: Diagnosis not present

## 2021-11-14 DIAGNOSIS — R278 Other lack of coordination: Secondary | ICD-10-CM | POA: Diagnosis not present

## 2021-11-19 DIAGNOSIS — R278 Other lack of coordination: Secondary | ICD-10-CM | POA: Diagnosis not present

## 2021-11-19 DIAGNOSIS — R2689 Other abnormalities of gait and mobility: Secondary | ICD-10-CM | POA: Diagnosis not present

## 2021-11-21 DIAGNOSIS — R2689 Other abnormalities of gait and mobility: Secondary | ICD-10-CM | POA: Diagnosis not present

## 2021-11-21 DIAGNOSIS — R278 Other lack of coordination: Secondary | ICD-10-CM | POA: Diagnosis not present

## 2021-11-25 DIAGNOSIS — Z23 Encounter for immunization: Secondary | ICD-10-CM | POA: Diagnosis not present

## 2021-11-27 DIAGNOSIS — F411 Generalized anxiety disorder: Secondary | ICD-10-CM | POA: Diagnosis not present

## 2021-11-27 DIAGNOSIS — R2689 Other abnormalities of gait and mobility: Secondary | ICD-10-CM | POA: Diagnosis not present

## 2021-11-27 DIAGNOSIS — E782 Mixed hyperlipidemia: Secondary | ICD-10-CM | POA: Diagnosis not present

## 2021-11-27 DIAGNOSIS — R278 Other lack of coordination: Secondary | ICD-10-CM | POA: Diagnosis not present

## 2021-11-27 DIAGNOSIS — I10 Essential (primary) hypertension: Secondary | ICD-10-CM | POA: Diagnosis not present

## 2021-11-27 DIAGNOSIS — J349 Unspecified disorder of nose and nasal sinuses: Secondary | ICD-10-CM | POA: Diagnosis not present

## 2021-11-29 DIAGNOSIS — R278 Other lack of coordination: Secondary | ICD-10-CM | POA: Diagnosis not present

## 2021-11-29 DIAGNOSIS — R2689 Other abnormalities of gait and mobility: Secondary | ICD-10-CM | POA: Diagnosis not present

## 2021-12-03 DIAGNOSIS — R2689 Other abnormalities of gait and mobility: Secondary | ICD-10-CM | POA: Diagnosis not present

## 2021-12-03 DIAGNOSIS — R278 Other lack of coordination: Secondary | ICD-10-CM | POA: Diagnosis not present

## 2021-12-05 DIAGNOSIS — M65342 Trigger finger, left ring finger: Secondary | ICD-10-CM | POA: Diagnosis not present

## 2021-12-06 DIAGNOSIS — R278 Other lack of coordination: Secondary | ICD-10-CM | POA: Diagnosis not present

## 2021-12-06 DIAGNOSIS — R2689 Other abnormalities of gait and mobility: Secondary | ICD-10-CM | POA: Diagnosis not present

## 2021-12-10 DIAGNOSIS — R2689 Other abnormalities of gait and mobility: Secondary | ICD-10-CM | POA: Diagnosis not present

## 2021-12-10 DIAGNOSIS — R278 Other lack of coordination: Secondary | ICD-10-CM | POA: Diagnosis not present

## 2021-12-12 DIAGNOSIS — R2689 Other abnormalities of gait and mobility: Secondary | ICD-10-CM | POA: Diagnosis not present

## 2021-12-12 DIAGNOSIS — R278 Other lack of coordination: Secondary | ICD-10-CM | POA: Diagnosis not present

## 2021-12-25 DIAGNOSIS — Z85828 Personal history of other malignant neoplasm of skin: Secondary | ICD-10-CM | POA: Diagnosis not present

## 2021-12-25 DIAGNOSIS — L812 Freckles: Secondary | ICD-10-CM | POA: Diagnosis not present

## 2021-12-25 DIAGNOSIS — Z85820 Personal history of malignant melanoma of skin: Secondary | ICD-10-CM | POA: Diagnosis not present

## 2021-12-25 DIAGNOSIS — D1801 Hemangioma of skin and subcutaneous tissue: Secondary | ICD-10-CM | POA: Diagnosis not present

## 2021-12-25 DIAGNOSIS — L820 Inflamed seborrheic keratosis: Secondary | ICD-10-CM | POA: Diagnosis not present

## 2021-12-25 DIAGNOSIS — L821 Other seborrheic keratosis: Secondary | ICD-10-CM | POA: Diagnosis not present

## 2021-12-26 DIAGNOSIS — H9113 Presbycusis, bilateral: Secondary | ICD-10-CM | POA: Diagnosis not present

## 2021-12-26 DIAGNOSIS — Z974 Presence of external hearing-aid: Secondary | ICD-10-CM | POA: Diagnosis not present

## 2022-01-09 DIAGNOSIS — K3184 Gastroparesis: Secondary | ICD-10-CM | POA: Diagnosis not present

## 2022-01-09 DIAGNOSIS — K6389 Other specified diseases of intestine: Secondary | ICD-10-CM | POA: Diagnosis not present

## 2022-01-10 DIAGNOSIS — R531 Weakness: Secondary | ICD-10-CM | POA: Diagnosis not present

## 2022-01-10 DIAGNOSIS — K909 Intestinal malabsorption, unspecified: Secondary | ICD-10-CM | POA: Diagnosis not present

## 2022-01-15 DIAGNOSIS — M5441 Lumbago with sciatica, right side: Secondary | ICD-10-CM | POA: Diagnosis not present

## 2022-01-30 DIAGNOSIS — M5416 Radiculopathy, lumbar region: Secondary | ICD-10-CM | POA: Diagnosis not present

## 2022-02-03 DIAGNOSIS — R319 Hematuria, unspecified: Secondary | ICD-10-CM | POA: Diagnosis not present

## 2022-02-26 DIAGNOSIS — R5383 Other fatigue: Secondary | ICD-10-CM | POA: Diagnosis not present

## 2022-02-26 DIAGNOSIS — R2681 Unsteadiness on feet: Secondary | ICD-10-CM | POA: Diagnosis not present

## 2022-02-26 DIAGNOSIS — R42 Dizziness and giddiness: Secondary | ICD-10-CM | POA: Diagnosis not present

## 2022-02-26 DIAGNOSIS — R002 Palpitations: Secondary | ICD-10-CM | POA: Diagnosis not present

## 2022-02-26 DIAGNOSIS — R195 Other fecal abnormalities: Secondary | ICD-10-CM | POA: Diagnosis not present

## 2022-03-11 DIAGNOSIS — H02132 Senile ectropion of right lower eyelid: Secondary | ICD-10-CM | POA: Diagnosis not present

## 2022-03-11 DIAGNOSIS — H02135 Senile ectropion of left lower eyelid: Secondary | ICD-10-CM | POA: Diagnosis not present

## 2022-03-21 DIAGNOSIS — M6281 Muscle weakness (generalized): Secondary | ICD-10-CM | POA: Diagnosis not present

## 2022-03-21 DIAGNOSIS — R278 Other lack of coordination: Secondary | ICD-10-CM | POA: Diagnosis not present

## 2022-04-03 DIAGNOSIS — K219 Gastro-esophageal reflux disease without esophagitis: Secondary | ICD-10-CM | POA: Diagnosis not present

## 2022-04-03 DIAGNOSIS — Z9889 Other specified postprocedural states: Secondary | ICD-10-CM | POA: Diagnosis not present

## 2022-04-03 DIAGNOSIS — K449 Diaphragmatic hernia without obstruction or gangrene: Secondary | ICD-10-CM | POA: Diagnosis not present

## 2022-04-03 DIAGNOSIS — Z8719 Personal history of other diseases of the digestive system: Secondary | ICD-10-CM | POA: Diagnosis not present

## 2022-04-03 DIAGNOSIS — R110 Nausea: Secondary | ICD-10-CM | POA: Diagnosis not present

## 2022-04-22 DIAGNOSIS — R002 Palpitations: Secondary | ICD-10-CM | POA: Diagnosis not present

## 2022-04-25 DIAGNOSIS — R140 Abdominal distension (gaseous): Secondary | ICD-10-CM | POA: Diagnosis not present

## 2022-04-25 DIAGNOSIS — R1312 Dysphagia, oropharyngeal phase: Secondary | ICD-10-CM | POA: Diagnosis not present

## 2022-04-25 DIAGNOSIS — K449 Diaphragmatic hernia without obstruction or gangrene: Secondary | ICD-10-CM | POA: Diagnosis not present

## 2022-04-25 DIAGNOSIS — Z01818 Encounter for other preprocedural examination: Secondary | ICD-10-CM | POA: Diagnosis not present

## 2022-04-28 DIAGNOSIS — R002 Palpitations: Secondary | ICD-10-CM | POA: Diagnosis not present

## 2022-05-07 DIAGNOSIS — H02105 Unspecified ectropion of left lower eyelid: Secondary | ICD-10-CM | POA: Diagnosis not present

## 2022-05-07 DIAGNOSIS — H02102 Unspecified ectropion of right lower eyelid: Secondary | ICD-10-CM | POA: Diagnosis not present

## 2022-05-07 DIAGNOSIS — H02051 Trichiasis without entropian right upper eyelid: Secondary | ICD-10-CM | POA: Diagnosis not present

## 2022-05-07 DIAGNOSIS — H02132 Senile ectropion of right lower eyelid: Secondary | ICD-10-CM | POA: Diagnosis not present

## 2022-05-07 DIAGNOSIS — H02135 Senile ectropion of left lower eyelid: Secondary | ICD-10-CM | POA: Diagnosis not present

## 2022-05-07 DIAGNOSIS — H02054 Trichiasis without entropian left upper eyelid: Secondary | ICD-10-CM | POA: Diagnosis not present

## 2022-05-15 DIAGNOSIS — M1711 Unilateral primary osteoarthritis, right knee: Secondary | ICD-10-CM | POA: Diagnosis not present

## 2022-05-27 DIAGNOSIS — H02052 Trichiasis without entropian right lower eyelid: Secondary | ICD-10-CM | POA: Diagnosis not present

## 2022-05-30 DIAGNOSIS — M5416 Radiculopathy, lumbar region: Secondary | ICD-10-CM | POA: Diagnosis not present

## 2022-06-10 DIAGNOSIS — K219 Gastro-esophageal reflux disease without esophagitis: Secondary | ICD-10-CM | POA: Diagnosis not present

## 2022-06-10 DIAGNOSIS — N400 Enlarged prostate without lower urinary tract symptoms: Secondary | ICD-10-CM | POA: Diagnosis not present

## 2022-06-10 DIAGNOSIS — I10 Essential (primary) hypertension: Secondary | ICD-10-CM | POA: Diagnosis not present

## 2022-06-10 DIAGNOSIS — R7301 Impaired fasting glucose: Secondary | ICD-10-CM | POA: Diagnosis not present

## 2022-06-10 DIAGNOSIS — E782 Mixed hyperlipidemia: Secondary | ICD-10-CM | POA: Diagnosis not present

## 2022-06-10 DIAGNOSIS — J4530 Mild persistent asthma, uncomplicated: Secondary | ICD-10-CM | POA: Diagnosis not present

## 2022-06-10 DIAGNOSIS — R413 Other amnesia: Secondary | ICD-10-CM | POA: Diagnosis not present

## 2022-06-10 DIAGNOSIS — K589 Irritable bowel syndrome without diarrhea: Secondary | ICD-10-CM | POA: Diagnosis not present

## 2022-06-10 DIAGNOSIS — I2510 Atherosclerotic heart disease of native coronary artery without angina pectoris: Secondary | ICD-10-CM | POA: Diagnosis not present

## 2022-06-10 DIAGNOSIS — Z79899 Other long term (current) drug therapy: Secondary | ICD-10-CM | POA: Diagnosis not present

## 2022-06-10 DIAGNOSIS — F439 Reaction to severe stress, unspecified: Secondary | ICD-10-CM | POA: Diagnosis not present

## 2022-06-10 DIAGNOSIS — Z0000 Encounter for general adult medical examination without abnormal findings: Secondary | ICD-10-CM | POA: Diagnosis not present

## 2022-07-15 DIAGNOSIS — H02051 Trichiasis without entropian right upper eyelid: Secondary | ICD-10-CM | POA: Diagnosis not present

## 2022-07-16 DIAGNOSIS — L218 Other seborrheic dermatitis: Secondary | ICD-10-CM | POA: Diagnosis not present

## 2022-07-16 DIAGNOSIS — D1801 Hemangioma of skin and subcutaneous tissue: Secondary | ICD-10-CM | POA: Diagnosis not present

## 2022-07-16 DIAGNOSIS — L821 Other seborrheic keratosis: Secondary | ICD-10-CM | POA: Diagnosis not present

## 2022-07-16 DIAGNOSIS — Z85828 Personal history of other malignant neoplasm of skin: Secondary | ICD-10-CM | POA: Diagnosis not present

## 2022-07-16 DIAGNOSIS — L812 Freckles: Secondary | ICD-10-CM | POA: Diagnosis not present

## 2022-07-16 DIAGNOSIS — Z85820 Personal history of malignant melanoma of skin: Secondary | ICD-10-CM | POA: Diagnosis not present

## 2022-07-16 DIAGNOSIS — D225 Melanocytic nevi of trunk: Secondary | ICD-10-CM | POA: Diagnosis not present

## 2022-07-22 DIAGNOSIS — J0110 Acute frontal sinusitis, unspecified: Secondary | ICD-10-CM | POA: Diagnosis not present

## 2022-07-28 DIAGNOSIS — R0981 Nasal congestion: Secondary | ICD-10-CM | POA: Diagnosis not present

## 2022-07-28 DIAGNOSIS — J3089 Other allergic rhinitis: Secondary | ICD-10-CM | POA: Diagnosis not present

## 2022-07-28 DIAGNOSIS — K219 Gastro-esophageal reflux disease without esophagitis: Secondary | ICD-10-CM | POA: Diagnosis not present

## 2022-08-05 DIAGNOSIS — H02052 Trichiasis without entropian right lower eyelid: Secondary | ICD-10-CM | POA: Diagnosis not present

## 2022-08-22 DIAGNOSIS — M65332 Trigger finger, left middle finger: Secondary | ICD-10-CM | POA: Diagnosis not present

## 2022-08-22 DIAGNOSIS — M65342 Trigger finger, left ring finger: Secondary | ICD-10-CM | POA: Diagnosis not present

## 2022-08-22 DIAGNOSIS — M65341 Trigger finger, right ring finger: Secondary | ICD-10-CM | POA: Diagnosis not present

## 2022-09-09 DIAGNOSIS — M65332 Trigger finger, left middle finger: Secondary | ICD-10-CM | POA: Diagnosis not present

## 2022-09-09 DIAGNOSIS — M65342 Trigger finger, left ring finger: Secondary | ICD-10-CM | POA: Diagnosis not present

## 2022-10-01 DIAGNOSIS — K219 Gastro-esophageal reflux disease without esophagitis: Secondary | ICD-10-CM | POA: Diagnosis not present

## 2022-10-01 DIAGNOSIS — K6389 Other specified diseases of intestine: Secondary | ICD-10-CM | POA: Diagnosis not present

## 2022-11-22 DIAGNOSIS — Z23 Encounter for immunization: Secondary | ICD-10-CM | POA: Diagnosis not present

## 2022-11-27 ENCOUNTER — Encounter (HOSPITAL_BASED_OUTPATIENT_CLINIC_OR_DEPARTMENT_OTHER): Payer: Self-pay

## 2022-11-27 ENCOUNTER — Emergency Department (HOSPITAL_BASED_OUTPATIENT_CLINIC_OR_DEPARTMENT_OTHER)
Admission: EM | Admit: 2022-11-27 | Discharge: 2022-11-27 | Disposition: A | Discharge status: Home / Self Care | Payer: Medicare Other | Attending: Emergency Medicine | Admitting: Emergency Medicine

## 2022-11-27 ENCOUNTER — Other Ambulatory Visit: Payer: Self-pay

## 2022-11-27 ENCOUNTER — Emergency Department (HOSPITAL_BASED_OUTPATIENT_CLINIC_OR_DEPARTMENT_OTHER): Payer: Medicare Other

## 2022-11-27 DIAGNOSIS — Z23 Encounter for immunization: Secondary | ICD-10-CM | POA: Insufficient documentation

## 2022-11-27 DIAGNOSIS — S0990XA Unspecified injury of head, initial encounter: Secondary | ICD-10-CM | POA: Diagnosis not present

## 2022-11-27 DIAGNOSIS — W19XXXA Unspecified fall, initial encounter: Secondary | ICD-10-CM

## 2022-11-27 DIAGNOSIS — Z7982 Long term (current) use of aspirin: Secondary | ICD-10-CM | POA: Diagnosis not present

## 2022-11-27 DIAGNOSIS — W06XXXA Fall from bed, initial encounter: Secondary | ICD-10-CM | POA: Insufficient documentation

## 2022-11-27 DIAGNOSIS — I2510 Atherosclerotic heart disease of native coronary artery without angina pectoris: Secondary | ICD-10-CM | POA: Insufficient documentation

## 2022-11-27 DIAGNOSIS — M47812 Spondylosis without myelopathy or radiculopathy, cervical region: Secondary | ICD-10-CM | POA: Diagnosis not present

## 2022-11-27 DIAGNOSIS — S0991XA Unspecified injury of ear, initial encounter: Secondary | ICD-10-CM | POA: Diagnosis present

## 2022-11-27 DIAGNOSIS — S199XXA Unspecified injury of neck, initial encounter: Secondary | ICD-10-CM | POA: Diagnosis not present

## 2022-11-27 DIAGNOSIS — I6782 Cerebral ischemia: Secondary | ICD-10-CM | POA: Diagnosis not present

## 2022-11-27 DIAGNOSIS — S01311A Laceration without foreign body of right ear, initial encounter: Secondary | ICD-10-CM | POA: Diagnosis not present

## 2022-11-27 DIAGNOSIS — M50222 Other cervical disc displacement at C5-C6 level: Secondary | ICD-10-CM | POA: Diagnosis not present

## 2022-11-27 MED ORDER — CIPROFLOXACIN HCL 500 MG PO TABS: 500.0000 mg | ORAL_TABLET | Freq: Two times a day (BID) | ORAL | 0 refills | Status: AC

## 2022-11-27 MED ORDER — TETANUS-DIPHTH-ACELL PERTUSSIS 5-2.5-18.5 LF-MCG/0.5 IM SUSY
0.5000 mL | PREFILLED_SYRINGE | Freq: Once | INTRAMUSCULAR | Status: AC
  Administered 2022-11-27: 0.5 mL via INTRAMUSCULAR
  Filled 2022-11-27: qty 0.5

## 2022-11-27 MED ORDER — LIDOCAINE HCL (PF) 1 % IJ SOLN
5.0000 mL | Freq: Once | INTRAMUSCULAR | Status: AC
  Administered 2022-11-27: 5 mL
  Filled 2022-11-27: qty 5

## 2022-11-27 NOTE — ED Provider Notes (Signed)
[facility] Provider Note   CSN: [id number] Arrival date & time: 11/27/22  4098     History  Chief Complaint  Patient presents with   Alex Oconnor is a 89 y.o. male.  89 y.o. male presenting s/p fall out of bed this morning where he hit his head on his night stand. He denies LOC, trouble getting up, or seizure like activity. He has a past medical history of TIA, CAD currently on ASA 81 mg. He denies being on blood thinners. He reports being in good physical health and being very active for his age.   He has a wound on his right ear that was caused from him hitting the sharp part of his night stand. He reports bleeding was well controlled prior to arriving to the ED.   The history is provided by the patient. No language interpreter was used.  Fall Pertinent negatives include no headaches and no shortness of breath.       Home Medications Prior to Admission medications   Medication Sig Start Date End Date Taking? Authorizing Provider  ciprofloxacin (CIPRO) 500 MG tablet Take 1 tablet (500 mg total) by mouth every 12 (twelve) hours for 5 days. 11/27/22 12/02/22 Yes Glendale Chard, DO  albuterol (VENTOLIN HFA) 108 (90 Base) MCG/ACT inhaler Inhale 2 puffs into the lungs every 6 (six) hours as needed for wheezing or shortness of breath. 11/04/20   Leath-Warren, Sadie Haber, NP  aspirin 81 MG tablet Take 81 mg by mouth daily.    [provider]  benazepril (LOTENSIN) 10 MG tablet Take 10 mg by mouth daily. 07/25/14   [provider]  budesonide-formoterol (SYMBICORT) 160-4.5 MCG/ACT inhaler 2 puffs 05/11/20   [provider]  cholecalciferol (VITAMIN D) 1000 UNITS tablet Take 1,000 Units by mouth daily.    [provider]  Coenzyme Q10 100 MG capsule Take 100 mg by mouth daily.    [provider]  COVID-19 mRNA bivalent vaccine, Pfizer, (PFIZER COVID-19 VAC BIVALENT) injection Inject into the  muscle. 02/06/21   Judyann Munson, MD  COVID-19 mRNA Vac-TriS, Pfizer, (PFIZER-BIONT COVID-19 VAC-TRIS) SUSP injection Inject into the muscle. Patient not taking: Reported on 11/04/2020 07/18/20   Judyann Munson, MD  escitalopram (LEXAPRO) 10 MG tablet Take 1 tablet by mouth daily. 06/15/20   [provider]  fluticasone (FLONASE) 50 MCG/ACT nasal spray Place 1 spray into both nostrils daily as needed for allergies or rhinitis.    [provider]  fluticasone (FLOVENT HFA) 110 MCG/ACT inhaler Inhale 1 puff into the lungs daily as needed. Patient not taking: Reported on 11/04/2020    [provider]  LORazepam (ATIVAN) 1 MG tablet Take 0.5 mg by mouth at bedtime. Patient not taking: Reported on 11/04/2020    [provider]  Omeprazole-Sodium Bicarbonate (ZEGERID) 20-1100 MG CAPS capsule Take 1 capsule by mouth daily before breakfast.    [provider]  Probiotic Product (PROBIOTIC PO) Take 1 tablet by mouth daily.    [provider]  saw palmetto 500 MG capsule Take 500 mg by mouth every morning.     [provider]  simvastatin (ZOCOR) 20 MG tablet Take 20 mg by mouth every morning.     [provider]      Allergies    Pantoprazole, Penicillins, Plavix [clopidogrel bisulfate], Ciprofloxacin, and Sulfa antibiotics    Review of Systems   Review of Systems  Constitutional:  Negative  for activity change and fatigue.  HENT:  Positive for ear pain, nosebleeds, tinnitus and voice change.   Eyes:  Negative for photophobia and visual disturbance.  Respiratory:  Negative for chest tightness and shortness of breath.   Gastrointestinal:  Negative for nausea and vomiting.  Neurological:  Negative for dizziness, seizures, syncope, facial asymmetry, speech difficulty, weakness, light-headedness and headaches.  Psychiatric/Behavioral:  Negative for agitation, behavioral problems and confusion.     Physical Exam Updated Vital  Signs BP (!) 180/95   Pulse (!) 47   Temp 97.9 F (36.6 C) (Temporal)   Resp 18   Ht 5\' 8"  (1.727 m)   Wt 67.6 kg   SpO2 98%   BMI 22.66 kg/m  Physical Exam Constitutional:      General: He is not in acute distress.    Appearance: Normal appearance. He is normal weight. He is not ill-appearing.  HENT:     Head: Normocephalic.     Left Ear: External ear normal.     Ears:     Comments: 5 mm stellate laceration on right inferior crus of antihelix     Nose: Nose normal.     Mouth/Throat:     Mouth: Mucous membranes are moist.  Eyes:     General: No visual field deficit.    Extraocular Movements: Extraocular movements intact.     Conjunctiva/sclera: Conjunctivae normal.     Pupils: Pupils are equal, round, and reactive to light.  Cardiovascular:     Rate and Rhythm: Normal rate and regular rhythm.     Pulses: Normal pulses.     Heart sounds: Normal heart sounds.  Pulmonary:     Effort: Pulmonary effort is normal.     Breath sounds: Normal breath sounds.  Abdominal:     General: Abdomen is flat. Bowel sounds are normal.     Palpations: Abdomen is soft.  Skin:    General: Skin is warm.     Capillary Refill: Capillary refill takes less than 2 seconds.  Neurological:     Mental Status: He is alert and oriented to person, place, and time. Mental status is at baseline.     GCS: GCS eye subscore is 4. GCS verbal subscore is 5. GCS motor subscore is 6.     Cranial Nerves: No facial asymmetry.     Sensory: Sensation is intact.     Motor: Motor function is intact. No weakness, abnormal muscle tone, seizure activity or pronator drift.     Coordination: Finger-Nose-Finger Test and Heel to [facility] Test normal.     Deep Tendon Reflexes:     Reflex Scores:      Patellar reflexes are 2+ on the right side and 2+ on the left side.    ED Results / Procedures / Treatments   Labs (all labs ordered are listed, but only abnormal results are displayed) Labs Reviewed - No data to  display  EKG None  Radiology CT Head Wo Contrast  Result Date: 11/27/2022 CLINICAL DATA:  Polytrauma, blunt.  Fall out of bed. EXAM: CT HEAD WITHOUT CONTRAST CT CERVICAL SPINE WITHOUT CONTRAST TECHNIQUE: Multidetector CT imaging of the head and cervical spine was performed following the standard protocol without intravenous contrast. Multiplanar CT image reconstructions of the cervical spine were also generated. RADIATION DOSE REDUCTION: This exam was performed according to the departmental dose-optimization program which includes automated exposure control, adjustment of the mA and/or kV according to patient size and/or use of iterative reconstruction technique. COMPARISON:  Head  MRI 10/24/2021.  Chest CT 04/30/2020. FINDINGS: CT HEAD FINDINGS Brain: There is no evidence of an acute infarct, intracranial hemorrhage, mass, midline shift, or extra-axial fluid collection. There is mild generalized cerebral atrophy. Patchy to confluent hypodensities in the cerebral white matter nonspecific but compatible with moderate chronic small vessel ischemic disease. Vascular: Calcified atherosclerosis at the skull base. No hyperdense vessel. Skull: No acute fracture or suspicious osseous lesion. Sinuses/Orbits: Partially visualized mucosal thickening in the right maxillary sinus. Clear mastoid air cells. Unremarkable orbits. Other: None. CT CERVICAL SPINE FINDINGS Alignment: Reversal of the normal cervical lordosis. Minimal retrolisthesis of C5 on C6 and C6 on C7 and minimal anterolisthesis of C7 on T1. Skull base and vertebrae: No acute fracture or suspicious osseous lesion. Moderate median C1-2 arthropathy. Soft tissues and spinal canal: No prevertebral fluid or swelling. No visible canal hematoma. Disc levels: Advanced disc degeneration from C3-4 through C6-7 and moderate to severe multilevel facet arthrosis. Advanced neural foraminal stenosis bilaterally at C3-4, on the right at C4-5, and bilaterally at C5-6.  Mild-to-moderate spinal stenosis at C5-6. Upper chest: Two 4 mm nodules in the left lung apex, unchanged from 2022 and considered benign with no follow-up imaging recommended. Other: Surgical clips in the right neck. Moderate calcific atherosclerosis about the carotid bifurcations. IMPRESSION: 1. No evidence of acute intracranial abnormality or acute cervical spine fracture. 2. Moderate chronic small vessel ischemic disease. 3. Advanced cervical disc and facet degeneration. Electronically Signed   By: Sebastian Ache M.D.   On: 11/27/2022 07:52   CT Cervical Spine Wo Contrast  Result Date: 11/27/2022 CLINICAL DATA:  Polytrauma, blunt.  Fall out of bed. EXAM: CT HEAD WITHOUT CONTRAST CT CERVICAL SPINE WITHOUT CONTRAST TECHNIQUE: Multidetector CT imaging of the head and cervical spine was performed following the standard protocol without intravenous contrast. Multiplanar CT image reconstructions of the cervical spine were also generated. RADIATION DOSE REDUCTION: This exam was performed according to the departmental dose-optimization program which includes automated exposure control, adjustment of the mA and/or kV according to patient size and/or use of iterative reconstruction technique. COMPARISON:  Head MRI 10/24/2021.  Chest CT 04/30/2020. FINDINGS: CT HEAD FINDINGS Brain: There is no evidence of an acute infarct, intracranial hemorrhage, mass, midline shift, or extra-axial fluid collection. There is mild generalized cerebral atrophy. Patchy to confluent hypodensities in the cerebral white matter nonspecific but compatible with moderate chronic small vessel ischemic disease. Vascular: Calcified atherosclerosis at the skull base. No hyperdense vessel. Skull: No acute fracture or suspicious osseous lesion. Sinuses/Orbits: Partially visualized mucosal thickening in the right maxillary sinus. Clear mastoid air cells. Unremarkable orbits. Other: None. CT CERVICAL SPINE FINDINGS Alignment: Reversal of the normal  cervical lordosis. Minimal retrolisthesis of C5 on C6 and C6 on C7 and minimal anterolisthesis of C7 on T1. Skull base and vertebrae: No acute fracture or suspicious osseous lesion. Moderate median C1-2 arthropathy. Soft tissues and spinal canal: No prevertebral fluid or swelling. No visible canal hematoma. Disc levels: Advanced disc degeneration from C3-4 through C6-7 and moderate to severe multilevel facet arthrosis. Advanced neural foraminal stenosis bilaterally at C3-4, on the right at C4-5, and bilaterally at C5-6. Mild-to-moderate spinal stenosis at C5-6. Upper chest: Two 4 mm nodules in the left lung apex, unchanged from 2022 and considered benign with no follow-up imaging recommended. Other: Surgical clips in the right neck. Moderate calcific atherosclerosis about the carotid bifurcations. IMPRESSION: 1. No evidence of acute intracranial abnormality or acute cervical spine fracture. 2. Moderate chronic small vessel ischemic disease. 3.  Advanced cervical disc and facet degeneration. Electronically Signed   By: Sebastian Ache M.D.   On: 11/27/2022 07:52    Procedures .Marland KitchenLaceration Repair  Date/Time: 11/27/2022 8:14 AM  Performed by: Glendale Chard, DO Authorized by: Alvira Monday, MD   Consent:    Consent obtained:  Verbal   Consent given by:  Patient   Risks, benefits, and alternatives were discussed: yes     Risks discussed:  Infection, pain, need for additional repair, poor cosmetic result and poor wound healing Universal protocol:    Procedure explained and questions answered to patient or proxy's satisfaction: yes     Relevant documents present and verified: yes     Required blood products, implants, devices, and special equipment available: no     Site/side marked: no     Patient identity confirmed:  Verbally with patient Anesthesia:    Anesthesia method:  Local infiltration   Local anesthetic:  Lidocaine 1% w/o epi Laceration details:    Location:  Ear   Ear location:  R ear    Length (cm):  2.7   Depth (mm):  5 Pre-procedure details:    Preparation:  Patient was prepped and draped in usual sterile fashion Exploration:    Limited defect created (wound extended): no     Hemostasis achieved with:  Direct pressure   Imaging outcome: foreign body not noted     Wound exploration: wound explored through full range of motion and entire depth of wound visualized     Wound extent: no foreign body, no signs of injury and no vascular damage     Contaminated: no   Treatment:    Area cleansed with:  Saline and povidone-iodine   Amount of cleaning:  Standard   Irrigation solution:  Sterile saline   Irrigation method:  Pressure wash and syringe   Debridement:  None   Scar revision: no   Skin repair:    Repair method:  Sutures   Suture size:  5-0   Suture material:  Prolene   Suture technique:  Simple interrupted   Number of sutures:  5 Approximation:    Approximation:  Close Repair type:    Repair type:  Simple Post-procedure details:    Dressing:  Antibiotic ointment and non-adherent dressing   Procedure completion:  Tolerated     Medications Ordered in ED Medications  lidocaine (PF) (XYLOCAINE) 1 % injection 5 mL (has no administration in time range)  Tdap (BOOSTRIX) injection 0.5 mL (has no administration in time range)    ED Course/ Medical Decision Making/ A&P                                 Medical Decision Making 89 y.o. male arriving s/p fall out of bed hitting his head. On presentation VSS. On exam he is neurologically intact without focal deficits or changes in vision or hearing. His wife at bedside reports he is at his mental baseline. He is only on ASA 81 mg at home. Due to his age, will obtain CT head and neck to r/o intracranial process. 2.7 cm stellate laceration present on right outer ear near inferior crus of antihelix with well controlled bleeding.   On reassessment, CT head and neck negative for acute abnormality. He wound was numbed with  1% lidocaine, irrigated, and prepped with iodine. Closed with 5 simple 5-0 proline stiches.  RN dressed with non-stick bandage. Due to the depth of  wound and cartilage exposure, prescribed ciprofloxacin for 5 days for increased risk of infection. Tdap administered prior to discharge. Wound care instructions provided to patient. Instructed to follow up with PCP outpatient for suture removal in 5-7 days.  Amount and/or Complexity of Data Reviewed Radiology: ordered. Decision-making details documented in ED Course.  Risk Prescription drug management.          Final Clinical Impression(s) / ED Diagnoses Final diagnoses:  Fall, initial encounter  Laceration of antihelix of right ear, initial encounter    Rx / DC Orders ED Discharge Orders          Ordered    ciprofloxacin (CIPRO) 500 MG tablet  Every 12 hours        11/27/22 0822              Glendale Chard, DO 11/27/22 8119    Alvira Monday, MD 11/27/22 2309

## 2022-11-27 NOTE — ED Notes (Signed)
ED Provider at bedside. 

## 2022-11-27 NOTE — ED Notes (Signed)
Dressing applied to pt right ear. Pt educated on wound care and dressing changes.

## 2022-11-27 NOTE — ED Triage Notes (Signed)
Pt having a bad dream and fell out of bed. Pt with injury to the RT ear. Bleeding controlled. Pt not on any blood thinners. Pt reports he does not have any issues with his vision, no neck tenderness.

## 2022-12-11 DIAGNOSIS — N400 Enlarged prostate without lower urinary tract symptoms: Secondary | ICD-10-CM | POA: Diagnosis not present

## 2022-12-11 DIAGNOSIS — I2510 Atherosclerotic heart disease of native coronary artery without angina pectoris: Secondary | ICD-10-CM | POA: Diagnosis not present

## 2022-12-11 DIAGNOSIS — R140 Abdominal distension (gaseous): Secondary | ICD-10-CM | POA: Diagnosis not present

## 2022-12-11 DIAGNOSIS — E782 Mixed hyperlipidemia: Secondary | ICD-10-CM | POA: Diagnosis not present

## 2022-12-11 DIAGNOSIS — I10 Essential (primary) hypertension: Secondary | ICD-10-CM | POA: Diagnosis not present

## 2022-12-11 DIAGNOSIS — J4530 Mild persistent asthma, uncomplicated: Secondary | ICD-10-CM | POA: Diagnosis not present

## 2022-12-11 DIAGNOSIS — R7303 Prediabetes: Secondary | ICD-10-CM | POA: Diagnosis not present

## 2022-12-11 DIAGNOSIS — K589 Irritable bowel syndrome without diarrhea: Secondary | ICD-10-CM | POA: Diagnosis not present

## 2022-12-11 DIAGNOSIS — K219 Gastro-esophageal reflux disease without esophagitis: Secondary | ICD-10-CM | POA: Diagnosis not present

## 2022-12-11 DIAGNOSIS — F439 Reaction to severe stress, unspecified: Secondary | ICD-10-CM | POA: Diagnosis not present

## 2022-12-11 DIAGNOSIS — Z79899 Other long term (current) drug therapy: Secondary | ICD-10-CM | POA: Diagnosis not present

## 2022-12-15 DIAGNOSIS — H5203 Hypermetropia, bilateral: Secondary | ICD-10-CM | POA: Diagnosis not present

## 2022-12-15 DIAGNOSIS — H02105 Unspecified ectropion of left lower eyelid: Secondary | ICD-10-CM | POA: Diagnosis not present

## 2022-12-15 DIAGNOSIS — H2513 Age-related nuclear cataract, bilateral: Secondary | ICD-10-CM | POA: Diagnosis not present

## 2022-12-15 DIAGNOSIS — H524 Presbyopia: Secondary | ICD-10-CM | POA: Diagnosis not present

## 2022-12-15 DIAGNOSIS — H02102 Unspecified ectropion of right lower eyelid: Secondary | ICD-10-CM | POA: Diagnosis not present

## 2022-12-19 DIAGNOSIS — M1712 Unilateral primary osteoarthritis, left knee: Secondary | ICD-10-CM | POA: Diagnosis not present

## 2023-01-19 DIAGNOSIS — L820 Inflamed seborrheic keratosis: Secondary | ICD-10-CM | POA: Diagnosis not present

## 2023-01-19 DIAGNOSIS — D1801 Hemangioma of skin and subcutaneous tissue: Secondary | ICD-10-CM | POA: Diagnosis not present

## 2023-01-19 DIAGNOSIS — L812 Freckles: Secondary | ICD-10-CM | POA: Diagnosis not present

## 2023-01-19 DIAGNOSIS — Z85820 Personal history of malignant melanoma of skin: Secondary | ICD-10-CM | POA: Diagnosis not present

## 2023-01-19 DIAGNOSIS — L821 Other seborrheic keratosis: Secondary | ICD-10-CM | POA: Diagnosis not present

## 2023-01-19 DIAGNOSIS — Z85828 Personal history of other malignant neoplasm of skin: Secondary | ICD-10-CM | POA: Diagnosis not present

## 2023-01-19 DIAGNOSIS — L853 Xerosis cutis: Secondary | ICD-10-CM | POA: Diagnosis not present

## 2023-01-24 ENCOUNTER — Emergency Department (HOSPITAL_BASED_OUTPATIENT_CLINIC_OR_DEPARTMENT_OTHER): Payer: Medicare Other

## 2023-01-24 ENCOUNTER — Emergency Department (HOSPITAL_BASED_OUTPATIENT_CLINIC_OR_DEPARTMENT_OTHER)
Admission: EM | Admit: 2023-01-24 | Discharge: 2023-01-24 | Disposition: A | Discharge status: Home / Self Care | Payer: Medicare Other

## 2023-01-24 ENCOUNTER — Encounter (HOSPITAL_BASED_OUTPATIENT_CLINIC_OR_DEPARTMENT_OTHER): Payer: Self-pay | Admitting: Emergency Medicine

## 2023-01-24 ENCOUNTER — Other Ambulatory Visit: Payer: Self-pay

## 2023-01-24 DIAGNOSIS — R7989 Other specified abnormal findings of blood chemistry: Secondary | ICD-10-CM | POA: Insufficient documentation

## 2023-01-24 DIAGNOSIS — K449 Diaphragmatic hernia without obstruction or gangrene: Secondary | ICD-10-CM | POA: Diagnosis not present

## 2023-01-24 DIAGNOSIS — E860 Dehydration: Secondary | ICD-10-CM | POA: Diagnosis not present

## 2023-01-24 DIAGNOSIS — K7689 Other specified diseases of liver: Secondary | ICD-10-CM | POA: Diagnosis not present

## 2023-01-24 DIAGNOSIS — R531 Weakness: Secondary | ICD-10-CM | POA: Insufficient documentation

## 2023-01-24 DIAGNOSIS — R42 Dizziness and giddiness: Secondary | ICD-10-CM | POA: Diagnosis present

## 2023-01-24 DIAGNOSIS — R1084 Generalized abdominal pain: Secondary | ICD-10-CM | POA: Diagnosis not present

## 2023-01-24 DIAGNOSIS — Z7982 Long term (current) use of aspirin: Secondary | ICD-10-CM | POA: Insufficient documentation

## 2023-01-24 DIAGNOSIS — K5730 Diverticulosis of large intestine without perforation or abscess without bleeding: Secondary | ICD-10-CM | POA: Diagnosis not present

## 2023-01-24 DIAGNOSIS — K439 Ventral hernia without obstruction or gangrene: Secondary | ICD-10-CM | POA: Diagnosis not present

## 2023-01-24 LAB — URINALYSIS, ROUTINE W REFLEX MICROSCOPIC
Bilirubin Urine: NEGATIVE
Glucose, UA: NEGATIVE mg/dL
Hgb urine dipstick: NEGATIVE
Ketones, ur: NEGATIVE mg/dL
Leukocytes,Ua: NEGATIVE
Nitrite: NEGATIVE
Protein, ur: NEGATIVE mg/dL
Specific Gravity, Urine: 1.025 (ref 1.005–1.030)
pH: 5.5 (ref 5.0–8.0)

## 2023-01-24 LAB — CBC
HCT: 43.2 % (ref 39.0–52.0)
Hemoglobin: 14.4 g/dL (ref 13.0–17.0)
MCH: 30 pg (ref 26.0–34.0)
MCHC: 33.3 g/dL (ref 30.0–36.0)
MCV: 90 fL (ref 80.0–100.0)
Platelets: 274 10*3/uL (ref 150–400)
RBC: 4.8 MIL/uL (ref 4.22–5.81)
RDW: 13 % (ref 11.5–15.5)
WBC: 11.5 10*3/uL — ABNORMAL HIGH (ref 4.0–10.5)
nRBC: 0 % (ref 0.0–0.2)

## 2023-01-24 LAB — HEPATIC FUNCTION PANEL
ALT: 16 U/L (ref 0–44)
AST: 20 U/L (ref 15–41)
Albumin: 3.5 g/dL (ref 3.5–5.0)
Alkaline Phosphatase: 75 U/L (ref 38–126)
Bilirubin, Direct: 0.1 mg/dL (ref 0.0–0.2)
Indirect Bilirubin: 0.5 mg/dL (ref 0.3–0.9)
Total Bilirubin: 0.6 mg/dL (ref <1.2)
Total Protein: 6.9 g/dL (ref 6.5–8.1)

## 2023-01-24 LAB — I-STAT CHEM 8, ED
BUN: 28 mg/dL — ABNORMAL HIGH (ref 8–23)
Calcium, Ion: 1.06 mmol/L — ABNORMAL LOW (ref 1.15–1.40)
Chloride: 103 mmol/L (ref 98–111)
Creatinine, Ser: 1.3 mg/dL — ABNORMAL HIGH (ref 0.61–1.24)
Glucose, Bld: 146 mg/dL — ABNORMAL HIGH (ref 70–99)
HCT: 45 % (ref 39.0–52.0)
Hemoglobin: 15.3 g/dL (ref 13.0–17.0)
Potassium: 3.8 mmol/L (ref 3.5–5.1)
Sodium: 139 mmol/L (ref 135–145)
TCO2: 22 mmol/L (ref 22–32)

## 2023-01-24 LAB — LIPASE, BLOOD: Lipase: 39 U/L (ref 11–51)

## 2023-01-24 LAB — CBG MONITORING, ED: Glucose-Capillary: 145 mg/dL — ABNORMAL HIGH (ref 70–99)

## 2023-01-24 MED ORDER — METOCLOPRAMIDE HCL 5 MG/ML IJ SOLN
5.0000 mg | Freq: Once | INTRAMUSCULAR | Status: AC
  Administered 2023-01-24: 5 mg via INTRAVENOUS
  Filled 2023-01-24: qty 2

## 2023-01-24 MED ORDER — SODIUM CHLORIDE 0.9 % IV BOLUS
1000.0000 mL | Freq: Once | INTRAVENOUS | Status: AC
  Administered 2023-01-24: 1000 mL via INTRAVENOUS

## 2023-01-24 MED ORDER — ONDANSETRON HCL 4 MG/2ML IJ SOLN
4.0000 mg | Freq: Once | INTRAMUSCULAR | Status: DC
  Filled 2023-01-24: qty 2

## 2023-01-24 MED ORDER — IOHEXOL 300 MG/ML  SOLN
75.0000 mL | Freq: Once | INTRAMUSCULAR | Status: AC | PRN
  Administered 2023-01-24: 75 mL via INTRAVENOUS

## 2023-01-24 MED ORDER — METOCLOPRAMIDE HCL 10 MG PO TABS: 10.0000 mg | ORAL_TABLET | Freq: Three times a day (TID) | ORAL | 0 refills | Status: AC | PRN

## 2023-01-24 NOTE — ED Triage Notes (Signed)
Pt states Hx of GI motility issues, states is unable to eat or drink. Feels weak, dizzy and dehydrated.

## 2023-01-24 NOTE — ED Notes (Signed)
Pt laid flat at 9:46 for ortho static vitals

## 2023-01-24 NOTE — ED Notes (Signed)
Patient transported to CT 

## 2023-01-24 NOTE — ED Provider Notes (Signed)
[place] [facility] AT [facility] Provider Note   CSN: [id number] Arrival date & time: 01/24/23  1930     History  Chief Complaint  Patient presents with   GI Problem    Alex Oconnor is a 89 y.o. male.  This is an 89 year old male has reported nausea, decreased appetite over the past week.  Reports long history of GI issues with hiatal hernia repair.  He is having normal bowel movements.  However states that he gets quite nauseated after he eats.  No vomiting, but does have some reflux/regurgitation into his mouth.  He states that he has had numerous similar episodes in the past, but similarly have not lasted as long as today.  He is primary concern is he feels generalized weakness and lightheaded with exertion   GI Problem       Home Medications Prior to Admission medications   Medication Sig Start Date End Date Taking? Authorizing Provider  albuterol (VENTOLIN HFA) 108 (90 Base) MCG/ACT inhaler Inhale 2 puffs into the lungs every 6 (six) hours as needed for wheezing or shortness of breath. 11/04/20   Leath-Warren, Sadie Haber, NP  aspirin 81 MG tablet Take 81 mg by mouth daily.    [provider]  benazepril (LOTENSIN) 10 MG tablet Take 10 mg by mouth daily. 07/25/14   [provider]  budesonide-formoterol (SYMBICORT) 160-4.5 MCG/ACT inhaler 2 puffs 05/11/20   [provider]  cholecalciferol (VITAMIN D) 1000 UNITS tablet Take 1,000 Units by mouth daily.    [provider]  Coenzyme Q10 100 MG capsule Take 100 mg by mouth daily.    [provider]  COVID-19 mRNA bivalent vaccine, Pfizer, (PFIZER COVID-19 VAC BIVALENT) injection Inject into the muscle. 02/06/21   Judyann Munson, MD  COVID-19 mRNA Vac-TriS, Pfizer, (PFIZER-BIONT COVID-19 VAC-TRIS) SUSP injection Inject into the muscle. Patient not taking: Reported on 11/04/2020 07/18/20   Judyann Munson, MD  escitalopram (LEXAPRO) 10 MG tablet Take 1 tablet by  mouth daily. 06/15/20   [provider]  fluticasone (FLONASE) 50 MCG/ACT nasal spray Place 1 spray into both nostrils daily as needed for allergies or rhinitis.    [provider]  fluticasone (FLOVENT HFA) 110 MCG/ACT inhaler Inhale 1 puff into the lungs daily as needed. Patient not taking: Reported on 11/04/2020    [provider]  LORazepam (ATIVAN) 1 MG tablet Take 0.5 mg by mouth at bedtime. Patient not taking: Reported on 11/04/2020    [provider]  Omeprazole-Sodium Bicarbonate (ZEGERID) 20-1100 MG CAPS capsule Take 1 capsule by mouth daily before breakfast.    [provider]  Probiotic Product (PROBIOTIC PO) Take 1 tablet by mouth daily.    [provider]  saw palmetto 500 MG capsule Take 500 mg by mouth every morning.     [provider]  simvastatin (ZOCOR) 20 MG tablet Take 20 mg by mouth every morning.     [provider]      Allergies    Pantoprazole, Penicillins, Plavix [clopidogrel bisulfate], Ciprofloxacin, and Sulfa antibiotics    Review of Systems   Review of Systems  Physical Exam Updated Vital Signs BP 125/84   Pulse 75   Temp (!) 97.4 F (36.3 C) (Oral)   Resp 18   Ht 5\' 8"  (1.727 m)   Wt 68 kg   SpO2 97%   BMI 22.81 kg/m  Physical Exam Vitals and nursing note reviewed.  Constitutional:  General: He is not in acute distress.    Appearance: He is not toxic-appearing.  HENT:     Nose: Nose normal.     Mouth/Throat:     Mouth: Mucous membranes are moist.  Eyes:     Conjunctiva/sclera: Conjunctivae normal.  Cardiovascular:     Rate and Rhythm: Normal rate and regular rhythm.     Pulses: Normal pulses.  Pulmonary:     Effort: Pulmonary effort is normal.     Breath sounds: Normal breath sounds.  Abdominal:     General: Abdomen is flat. There is no distension.     Palpations: Abdomen is soft.     Tenderness: There is no abdominal tenderness. There is no guarding or rebound.      Hernia: No hernia is present.  Musculoskeletal:     Right lower leg: No edema.     Left lower leg: No edema.  Skin:    General: Skin is warm.     Capillary Refill: Capillary refill takes less than 2 seconds.  Neurological:     Mental Status: He is alert.  Psychiatric:        Mood and Affect: Mood normal.        Behavior: Behavior normal.     ED Results / Procedures / Treatments   Labs (all labs ordered are listed, but only abnormal results are displayed) Labs Reviewed  CBC - Abnormal; Notable for the following components:      Result Value   WBC 11.5 (*)    All other components within normal limits  CBG MONITORING, ED - Abnormal; Notable for the following components:   Glucose-Capillary 145 (*)    All other components within normal limits  I-STAT CHEM 8, ED - Abnormal; Notable for the following components:   BUN 28 (*)    Creatinine, Ser 1.30 (*)    Glucose, Bld 146 (*)    Calcium, Ion 1.06 (*)    All other components within normal limits  URINALYSIS, ROUTINE W REFLEX MICROSCOPIC  HEPATIC FUNCTION PANEL  LIPASE, BLOOD    EKG None  Radiology CT ABDOMEN PELVIS W CONTRAST  Result Date: 01/24/2023 CLINICAL DATA:  Bowel obstruction suspected. EXAM: CT ABDOMEN AND PELVIS WITH CONTRAST TECHNIQUE: Multidetector CT imaging of the abdomen and pelvis was performed using the standard protocol following bolus administration of intravenous contrast. RADIATION DOSE REDUCTION: This exam was performed according to the departmental dose-optimization program which includes automated exposure control, adjustment of the mA and/or kV according to patient size and/or use of iterative reconstruction technique. CONTRAST:  75mL OMNIPAQUE IOHEXOL 300 MG/ML  SOLN COMPARISON:  CT abdomen and pelvis 04/30/2020 FINDINGS: Lower chest: There is a 6 mm ground-glass nodule in the left lower lung image 302/2 which is unchanged from prior. Hepatobiliary: There are 2 hepatic cysts measuring up to 17 mm.  Gallbladder and bile ducts are within normal limits. Pancreas: Unremarkable. No pancreatic ductal dilatation or surrounding inflammatory changes. Spleen: Normal in size without focal abnormality. Adrenals/Urinary Tract: The bladder is distended, but otherwise within normal limits. There is no hydronephrosis or perinephric fluid. There are bilateral renal cysts, right greater than left. The largest cyst is in the right kidney measuring up to 5.2 cm. These are similar to prior examination. The adrenal glands are within normal limits. Stomach/Bowel: There is a moderate-sized hiatal hernia. The stomach is nondilated. Appendix appears normal. No evidence of bowel wall thickening, distention, or inflammatory changes. There is diffuse colonic diverticulosis. Vascular/Lymphatic: Aortic atherosclerosis. No enlarged  abdominal or pelvic lymph nodes. Reproductive: Prostate gland is enlarged. Other: There is a small fat containing ventral hernia just right of midline superior to the umbilicus. There is no ascites. Musculoskeletal: L4-5 posterior fusion hardware is present. IMPRESSION: 1. No acute localizing process in the abdomen or pelvis. 2. Moderate-sized hiatal hernia. 3. Colonic diverticulosis. 4. Prostatomegaly with distended bladder. 5. Small fat containing ventral hernia just right of midline superior to the umbilicus. 6. Stable 6 mm ground-glass nodule in the left lower lung. Initial follow-up with CT at 6 months is recommended to confirm persistence. If persistent, repeat CT is recommended every 2 years until 5 years of stability has been established. This recommendation follows the consensus statement: Guidelines for Management of Incidental Pulmonary Nodules Detected on CT Images: From the Fleischner Society 2017; Radiology 2017; 284:228-243. Aortic Atherosclerosis (ICD10-I70.0). Electronically Signed   By: Darliss Cheney M.D.   On: 01/24/2023 23:14    Procedures Procedures    Medications Ordered in  ED Medications  sodium chloride 0.9 % bolus 1,000 mL (1,000 mLs Intravenous New Bag/Given 01/24/23 2138)  metoCLOPramide (REGLAN) injection 5 mg (5 mg Intravenous Given 01/24/23 2153)  iohexol (OMNIPAQUE) 300 MG/ML solution 75 mL (75 mLs Intravenous Contrast Given 01/24/23 2244)    ED Course/ Medical Decision Making/ A&P Clinical Course as of 01/24/23 2323  Sat Jan 24, 2023  2319 CT ABDOMEN PELVIS W CONTRAST IMPRESSION: 1. No acute localizing process in the abdomen or pelvis. 2. Moderate-sized hiatal hernia. 3. Colonic diverticulosis. 4. Prostatomegaly with distended bladder. 5. Small fat containing ventral hernia just right of midline superior to the umbilicus. 6. Stable 6 mm ground-glass nodule in the left lower lung. Initial follow-up with CT at 6 months is recommended to confirm persistence. If persistent, repeat CT is recommended every 2 years until 5 years of stability has been established. This recommendation follows the consensus statement: Guidelines for Management of Incidental Pulmonary Nodules Detected on CT Images: From the Fleischner Society 2017; Radiology 2017; 284:228-243.  Aortic Atherosclerosis (ICD10-I70.0).   [TY]    Clinical Course User Index [TY] Coral Spikes, DO                                 Medical Decision Making This is an 89 year old male with history of recurrent bowel bacterial overgrowth, bloating distention hiatal hernia status post repair, TIAs, gastroparesis presenting emergency department with nausea, decreased p.o. intake and generalized weakness.  He is afebrile nontachycardic maintaining oxygen saturation on room air.  Has a benign abdominal exam.  Labs largely reassuring.  Minor leukocytosis.  No transaminitis to suggest hepatobiliary disease.  Lipase normal.  Pancreatitis unlikely.  Minor elevation in his BUN and creatinine, but appears essentially at his baseline.  And not consistent with AKI.  UA negative for UTI.  Given patient's  stage and complex past medical history CT abdomen obtained which showed no acute pathology.  Patient treated with Reglan and IV fluids.  Feels much improved after fluids and anti-emetics. He has appropriate follow-up outpatient.  Will discharge in stable condition.  Amount and/or Complexity of Data Reviewed External Data Reviewed:     Details: Saw gastric specialist at [facility] for per their note in August : "ASSESSMENT:   Mr. Kretzer is an 89 year old gentleman with a history of recurrent small bowel bacterial overgrowth, abdominal bloating and distention, history of large hiatal hernia status post fundoplication and repair. He may continue with his Gaviscon  after meals in order to address any reflux symptoms he may have. He is continued with his acid reducing medications on a daily basis. He may continue with his current regimen without any changes and will follow-up in 6 months or earlier if needed.  PLAN:   Continue with current regimen with Gaviscon as needed after meals or when having more reflux symptoms. Please let me know there is any questions or issues.  Follow-up in 6 months.  I personally spent 40 minutes face-to-face and non-face-to-face in the care of this patient, which includes all pre, intra, and post visit time on the date of service. " Labs: ordered. Radiology: ordered.  Risk Prescription drug management.          Final Clinical Impression(s) / ED Diagnoses Final diagnoses:  None    Rx / DC Orders ED Discharge Orders     None         Coral Spikes, DO 01/24/23 2323

## 2023-01-24 NOTE — Discharge Instructions (Addendum)
Please follow-up with primary doctor.  Please try to maintain adequate hydration with frequent sips liquids.  We have prescribed you some nausea meds which you may use if you have persistent nausea.  Please follow-up with your gastroenterologist.  Return immediately felt fevers, chills, worsening abdominal pain.  CT scan did show pulmonary nodule; please follow up with your primary doctor.   6. Stable 6 mm ground-glass nodule in the left lower lung. Initial  follow-up with CT at 6 months is recommended to confirm persistence.  If persistent, repeat CT is recommended every 2 years until 5 years  of stability has been established. This recommendation follows the  consensus statement: Guidelines for Management of Incidental  "

## 2023-02-02 DIAGNOSIS — R110 Nausea: Secondary | ICD-10-CM | POA: Diagnosis not present

## 2023-02-02 DIAGNOSIS — Z8719 Personal history of other diseases of the digestive system: Secondary | ICD-10-CM | POA: Diagnosis not present

## 2023-02-02 DIAGNOSIS — R142 Eructation: Secondary | ICD-10-CM | POA: Diagnosis not present

## 2023-02-02 DIAGNOSIS — Z9889 Other specified postprocedural states: Secondary | ICD-10-CM | POA: Diagnosis not present

## 2023-02-02 DIAGNOSIS — K449 Diaphragmatic hernia without obstruction or gangrene: Secondary | ICD-10-CM | POA: Diagnosis not present

## 2023-02-02 DIAGNOSIS — K224 Dyskinesia of esophagus: Secondary | ICD-10-CM | POA: Diagnosis not present

## 2023-03-23 DIAGNOSIS — K449 Diaphragmatic hernia without obstruction or gangrene: Secondary | ICD-10-CM | POA: Diagnosis not present

## 2023-03-23 DIAGNOSIS — K219 Gastro-esophageal reflux disease without esophagitis: Secondary | ICD-10-CM | POA: Diagnosis not present

## 2023-04-13 DIAGNOSIS — K224 Dyskinesia of esophagus: Secondary | ICD-10-CM | POA: Diagnosis not present

## 2023-04-13 DIAGNOSIS — Z9889 Other specified postprocedural states: Secondary | ICD-10-CM | POA: Diagnosis not present

## 2023-04-13 DIAGNOSIS — K219 Gastro-esophageal reflux disease without esophagitis: Secondary | ICD-10-CM | POA: Diagnosis not present

## 2023-04-13 DIAGNOSIS — K449 Diaphragmatic hernia without obstruction or gangrene: Secondary | ICD-10-CM | POA: Diagnosis not present

## 2023-05-12 DIAGNOSIS — G252 Other specified forms of tremor: Secondary | ICD-10-CM | POA: Diagnosis not present

## 2023-05-12 DIAGNOSIS — J302 Other seasonal allergic rhinitis: Secondary | ICD-10-CM | POA: Diagnosis not present

## 2023-05-12 DIAGNOSIS — R400 Somnolence: Secondary | ICD-10-CM | POA: Diagnosis not present

## 2023-05-12 DIAGNOSIS — R519 Headache, unspecified: Secondary | ICD-10-CM | POA: Diagnosis not present

## 2023-05-12 DIAGNOSIS — H6123 Impacted cerumen, bilateral: Secondary | ICD-10-CM | POA: Diagnosis not present

## 2023-05-12 DIAGNOSIS — R42 Dizziness and giddiness: Secondary | ICD-10-CM | POA: Diagnosis not present

## 2023-05-12 DIAGNOSIS — N401 Enlarged prostate with lower urinary tract symptoms: Secondary | ICD-10-CM | POA: Diagnosis not present

## 2023-05-15 DIAGNOSIS — I10 Essential (primary) hypertension: Secondary | ICD-10-CM | POA: Diagnosis not present

## 2023-05-15 DIAGNOSIS — I2510 Atherosclerotic heart disease of native coronary artery without angina pectoris: Secondary | ICD-10-CM | POA: Diagnosis not present

## 2023-05-25 DIAGNOSIS — N400 Enlarged prostate without lower urinary tract symptoms: Secondary | ICD-10-CM | POA: Diagnosis not present

## 2023-05-25 DIAGNOSIS — I2510 Atherosclerotic heart disease of native coronary artery without angina pectoris: Secondary | ICD-10-CM | POA: Diagnosis not present

## 2023-05-25 DIAGNOSIS — I10 Essential (primary) hypertension: Secondary | ICD-10-CM | POA: Diagnosis not present

## 2023-05-25 DIAGNOSIS — J4530 Mild persistent asthma, uncomplicated: Secondary | ICD-10-CM | POA: Diagnosis not present

## 2023-05-28 DIAGNOSIS — K219 Gastro-esophageal reflux disease without esophagitis: Secondary | ICD-10-CM | POA: Diagnosis not present

## 2023-05-28 DIAGNOSIS — K2289 Other specified disease of esophagus: Secondary | ICD-10-CM | POA: Diagnosis not present

## 2023-05-28 DIAGNOSIS — R948 Abnormal results of function studies of other organs and systems: Secondary | ICD-10-CM | POA: Diagnosis not present

## 2023-06-01 DIAGNOSIS — K219 Gastro-esophageal reflux disease without esophagitis: Secondary | ICD-10-CM | POA: Diagnosis not present

## 2023-06-01 DIAGNOSIS — R142 Eructation: Secondary | ICD-10-CM | POA: Diagnosis not present

## 2023-06-01 DIAGNOSIS — R198 Other specified symptoms and signs involving the digestive system and abdomen: Secondary | ICD-10-CM | POA: Diagnosis not present

## 2023-06-01 DIAGNOSIS — Z9889 Other specified postprocedural states: Secondary | ICD-10-CM | POA: Diagnosis not present

## 2023-06-01 DIAGNOSIS — R1110 Vomiting, unspecified: Secondary | ICD-10-CM | POA: Diagnosis not present

## 2023-06-13 DIAGNOSIS — I10 Essential (primary) hypertension: Secondary | ICD-10-CM | POA: Diagnosis not present

## 2023-06-13 DIAGNOSIS — I2510 Atherosclerotic heart disease of native coronary artery without angina pectoris: Secondary | ICD-10-CM | POA: Diagnosis not present

## 2023-06-15 DIAGNOSIS — I10 Essential (primary) hypertension: Secondary | ICD-10-CM | POA: Diagnosis not present

## 2023-06-15 DIAGNOSIS — I2510 Atherosclerotic heart disease of native coronary artery without angina pectoris: Secondary | ICD-10-CM | POA: Diagnosis not present

## 2023-06-15 DIAGNOSIS — K589 Irritable bowel syndrome without diarrhea: Secondary | ICD-10-CM | POA: Diagnosis not present

## 2023-06-15 DIAGNOSIS — J302 Other seasonal allergic rhinitis: Secondary | ICD-10-CM | POA: Diagnosis not present

## 2023-06-15 DIAGNOSIS — Z0000 Encounter for general adult medical examination without abnormal findings: Secondary | ICD-10-CM | POA: Diagnosis not present

## 2023-06-15 DIAGNOSIS — G252 Other specified forms of tremor: Secondary | ICD-10-CM | POA: Diagnosis not present

## 2023-06-15 DIAGNOSIS — J4530 Mild persistent asthma, uncomplicated: Secondary | ICD-10-CM | POA: Diagnosis not present

## 2023-06-15 DIAGNOSIS — F439 Reaction to severe stress, unspecified: Secondary | ICD-10-CM | POA: Diagnosis not present

## 2023-06-15 DIAGNOSIS — E782 Mixed hyperlipidemia: Secondary | ICD-10-CM | POA: Diagnosis not present

## 2023-06-15 DIAGNOSIS — R7303 Prediabetes: Secondary | ICD-10-CM | POA: Diagnosis not present

## 2023-06-15 DIAGNOSIS — K909 Intestinal malabsorption, unspecified: Secondary | ICD-10-CM | POA: Diagnosis not present

## 2023-06-15 DIAGNOSIS — K219 Gastro-esophageal reflux disease without esophagitis: Secondary | ICD-10-CM | POA: Diagnosis not present

## 2023-06-24 DIAGNOSIS — N400 Enlarged prostate without lower urinary tract symptoms: Secondary | ICD-10-CM | POA: Diagnosis not present

## 2023-06-24 DIAGNOSIS — I10 Essential (primary) hypertension: Secondary | ICD-10-CM | POA: Diagnosis not present

## 2023-06-24 DIAGNOSIS — I2510 Atherosclerotic heart disease of native coronary artery without angina pectoris: Secondary | ICD-10-CM | POA: Diagnosis not present

## 2023-06-24 DIAGNOSIS — J4530 Mild persistent asthma, uncomplicated: Secondary | ICD-10-CM | POA: Diagnosis not present

## 2023-07-06 DIAGNOSIS — K449 Diaphragmatic hernia without obstruction or gangrene: Secondary | ICD-10-CM | POA: Diagnosis not present

## 2023-07-06 DIAGNOSIS — Z8719 Personal history of other diseases of the digestive system: Secondary | ICD-10-CM | POA: Diagnosis not present

## 2023-07-06 DIAGNOSIS — Z9889 Other specified postprocedural states: Secondary | ICD-10-CM | POA: Diagnosis not present

## 2023-07-06 DIAGNOSIS — K219 Gastro-esophageal reflux disease without esophagitis: Secondary | ICD-10-CM | POA: Diagnosis not present

## 2023-07-13 DIAGNOSIS — I10 Essential (primary) hypertension: Secondary | ICD-10-CM | POA: Diagnosis not present

## 2023-07-13 DIAGNOSIS — L812 Freckles: Secondary | ICD-10-CM | POA: Diagnosis not present

## 2023-07-13 DIAGNOSIS — Z85828 Personal history of other malignant neoplasm of skin: Secondary | ICD-10-CM | POA: Diagnosis not present

## 2023-07-13 DIAGNOSIS — Z85820 Personal history of malignant melanoma of skin: Secondary | ICD-10-CM | POA: Diagnosis not present

## 2023-07-13 DIAGNOSIS — L821 Other seborrheic keratosis: Secondary | ICD-10-CM | POA: Diagnosis not present

## 2023-07-13 DIAGNOSIS — L218 Other seborrheic dermatitis: Secondary | ICD-10-CM | POA: Diagnosis not present

## 2023-07-13 DIAGNOSIS — D225 Melanocytic nevi of trunk: Secondary | ICD-10-CM | POA: Diagnosis not present

## 2023-07-13 DIAGNOSIS — I2510 Atherosclerotic heart disease of native coronary artery without angina pectoris: Secondary | ICD-10-CM | POA: Diagnosis not present

## 2023-07-13 DIAGNOSIS — D1801 Hemangioma of skin and subcutaneous tissue: Secondary | ICD-10-CM | POA: Diagnosis not present

## 2023-07-13 DIAGNOSIS — D692 Other nonthrombocytopenic purpura: Secondary | ICD-10-CM | POA: Diagnosis not present

## 2023-07-15 DIAGNOSIS — K219 Gastro-esophageal reflux disease without esophagitis: Secondary | ICD-10-CM | POA: Diagnosis not present

## 2023-07-15 DIAGNOSIS — Z8673 Personal history of transient ischemic attack (TIA), and cerebral infarction without residual deficits: Secondary | ICD-10-CM | POA: Diagnosis not present

## 2023-07-15 DIAGNOSIS — K3184 Gastroparesis: Secondary | ICD-10-CM | POA: Diagnosis not present

## 2023-07-15 DIAGNOSIS — Z7982 Long term (current) use of aspirin: Secondary | ICD-10-CM | POA: Diagnosis not present

## 2023-07-15 DIAGNOSIS — Z9889 Other specified postprocedural states: Secondary | ICD-10-CM | POA: Diagnosis not present

## 2023-07-15 DIAGNOSIS — Z8719 Personal history of other diseases of the digestive system: Secondary | ICD-10-CM | POA: Diagnosis not present

## 2023-07-15 DIAGNOSIS — Z955 Presence of coronary angioplasty implant and graft: Secondary | ICD-10-CM | POA: Diagnosis not present

## 2023-07-15 DIAGNOSIS — K449 Diaphragmatic hernia without obstruction or gangrene: Secondary | ICD-10-CM | POA: Diagnosis not present

## 2023-07-15 DIAGNOSIS — I10 Essential (primary) hypertension: Secondary | ICD-10-CM | POA: Diagnosis not present

## 2023-07-15 DIAGNOSIS — G4733 Obstructive sleep apnea (adult) (pediatric): Secondary | ICD-10-CM | POA: Diagnosis not present

## 2023-07-15 DIAGNOSIS — K660 Peritoneal adhesions (postprocedural) (postinfection): Secondary | ICD-10-CM | POA: Diagnosis not present

## 2023-07-15 DIAGNOSIS — I2510 Atherosclerotic heart disease of native coronary artery without angina pectoris: Secondary | ICD-10-CM | POA: Diagnosis not present

## 2023-07-15 DIAGNOSIS — Z87891 Personal history of nicotine dependence: Secondary | ICD-10-CM | POA: Diagnosis not present

## 2023-07-15 DIAGNOSIS — Z880 Allergy status to penicillin: Secondary | ICD-10-CM | POA: Diagnosis not present

## 2023-07-15 DIAGNOSIS — E1143 Type 2 diabetes mellitus with diabetic autonomic (poly)neuropathy: Secondary | ICD-10-CM | POA: Diagnosis not present

## 2023-07-15 DIAGNOSIS — K224 Dyskinesia of esophagus: Secondary | ICD-10-CM | POA: Diagnosis not present

## 2023-07-25 DIAGNOSIS — N400 Enlarged prostate without lower urinary tract symptoms: Secondary | ICD-10-CM | POA: Diagnosis not present

## 2023-07-25 DIAGNOSIS — J4530 Mild persistent asthma, uncomplicated: Secondary | ICD-10-CM | POA: Diagnosis not present

## 2023-07-25 DIAGNOSIS — I10 Essential (primary) hypertension: Secondary | ICD-10-CM | POA: Diagnosis not present

## 2023-07-25 DIAGNOSIS — I2510 Atherosclerotic heart disease of native coronary artery without angina pectoris: Secondary | ICD-10-CM | POA: Diagnosis not present

## 2023-07-27 DIAGNOSIS — R339 Retention of urine, unspecified: Secondary | ICD-10-CM | POA: Diagnosis not present

## 2023-07-30 DIAGNOSIS — R509 Fever, unspecified: Secondary | ICD-10-CM | POA: Diagnosis not present

## 2023-07-30 DIAGNOSIS — N390 Urinary tract infection, site not specified: Secondary | ICD-10-CM | POA: Diagnosis not present

## 2023-07-30 DIAGNOSIS — R102 Pelvic and perineal pain: Secondary | ICD-10-CM | POA: Diagnosis not present

## 2023-08-01 ENCOUNTER — Emergency Department (HOSPITAL_COMMUNITY)
Admission: EM | Admit: 2023-08-01 | Discharge: 2023-08-01 | Disposition: A | Discharge status: Home / Self Care | Attending: Emergency Medicine | Admitting: Emergency Medicine

## 2023-08-01 ENCOUNTER — Encounter (HOSPITAL_COMMUNITY): Payer: Self-pay | Admitting: *Deleted

## 2023-08-01 ENCOUNTER — Emergency Department (HOSPITAL_COMMUNITY)

## 2023-08-01 ENCOUNTER — Other Ambulatory Visit: Payer: Self-pay

## 2023-08-01 DIAGNOSIS — K3184 Gastroparesis: Secondary | ICD-10-CM | POA: Diagnosis not present

## 2023-08-01 DIAGNOSIS — B9689 Other specified bacterial agents as the cause of diseases classified elsewhere: Secondary | ICD-10-CM | POA: Diagnosis not present

## 2023-08-01 DIAGNOSIS — Z8249 Family history of ischemic heart disease and other diseases of the circulatory system: Secondary | ICD-10-CM | POA: Diagnosis not present

## 2023-08-01 DIAGNOSIS — Z8673 Personal history of transient ischemic attack (TIA), and cerebral infarction without residual deficits: Secondary | ICD-10-CM | POA: Diagnosis not present

## 2023-08-01 DIAGNOSIS — N390 Urinary tract infection, site not specified: Secondary | ICD-10-CM | POA: Insufficient documentation

## 2023-08-01 DIAGNOSIS — D72828 Other elevated white blood cell count: Secondary | ICD-10-CM | POA: Diagnosis not present

## 2023-08-01 DIAGNOSIS — K439 Ventral hernia without obstruction or gangrene: Secondary | ICD-10-CM | POA: Diagnosis not present

## 2023-08-01 DIAGNOSIS — K529 Noninfective gastroenteritis and colitis, unspecified: Secondary | ICD-10-CM | POA: Diagnosis not present

## 2023-08-01 DIAGNOSIS — J45909 Unspecified asthma, uncomplicated: Secondary | ICD-10-CM | POA: Diagnosis not present

## 2023-08-01 DIAGNOSIS — R338 Other retention of urine: Secondary | ICD-10-CM | POA: Diagnosis not present

## 2023-08-01 DIAGNOSIS — Z87891 Personal history of nicotine dependence: Secondary | ICD-10-CM | POA: Diagnosis not present

## 2023-08-01 DIAGNOSIS — K449 Diaphragmatic hernia without obstruction or gangrene: Secondary | ICD-10-CM | POA: Diagnosis not present

## 2023-08-01 DIAGNOSIS — I2510 Atherosclerotic heart disease of native coronary artery without angina pectoris: Secondary | ICD-10-CM | POA: Diagnosis not present

## 2023-08-01 DIAGNOSIS — G4730 Sleep apnea, unspecified: Secondary | ICD-10-CM | POA: Diagnosis not present

## 2023-08-01 DIAGNOSIS — K580 Irritable bowel syndrome with diarrhea: Secondary | ICD-10-CM | POA: Diagnosis not present

## 2023-08-01 DIAGNOSIS — N10 Acute tubulo-interstitial nephritis: Secondary | ICD-10-CM | POA: Diagnosis not present

## 2023-08-01 DIAGNOSIS — Z7982 Long term (current) use of aspirin: Secondary | ICD-10-CM | POA: Insufficient documentation

## 2023-08-01 DIAGNOSIS — Z7951 Long term (current) use of inhaled steroids: Secondary | ICD-10-CM | POA: Diagnosis not present

## 2023-08-01 DIAGNOSIS — K440 Diaphragmatic hernia with obstruction, without gangrene: Secondary | ICD-10-CM | POA: Diagnosis not present

## 2023-08-01 DIAGNOSIS — I10 Essential (primary) hypertension: Secondary | ICD-10-CM | POA: Diagnosis not present

## 2023-08-01 DIAGNOSIS — I110 Hypertensive heart disease with heart failure: Secondary | ICD-10-CM | POA: Diagnosis not present

## 2023-08-01 DIAGNOSIS — K219 Gastro-esophageal reflux disease without esophagitis: Secondary | ICD-10-CM | POA: Diagnosis not present

## 2023-08-01 DIAGNOSIS — R197 Diarrhea, unspecified: Secondary | ICD-10-CM | POA: Diagnosis not present

## 2023-08-01 DIAGNOSIS — E876 Hypokalemia: Secondary | ICD-10-CM | POA: Diagnosis not present

## 2023-08-01 DIAGNOSIS — E860 Dehydration: Secondary | ICD-10-CM | POA: Diagnosis not present

## 2023-08-01 DIAGNOSIS — Z66 Do not resuscitate: Secondary | ICD-10-CM | POA: Diagnosis not present

## 2023-08-01 DIAGNOSIS — Z955 Presence of coronary angioplasty implant and graft: Secondary | ICD-10-CM | POA: Diagnosis not present

## 2023-08-01 DIAGNOSIS — Z87440 Personal history of urinary (tract) infections: Secondary | ICD-10-CM | POA: Diagnosis not present

## 2023-08-01 DIAGNOSIS — I5032 Chronic diastolic (congestive) heart failure: Secondary | ICD-10-CM | POA: Diagnosis not present

## 2023-08-01 DIAGNOSIS — E785 Hyperlipidemia, unspecified: Secondary | ICD-10-CM | POA: Diagnosis not present

## 2023-08-01 LAB — URINALYSIS, W/ REFLEX TO CULTURE (INFECTION SUSPECTED)
Bacteria, UA: NONE SEEN
Bilirubin Urine: NEGATIVE
Glucose, UA: NEGATIVE mg/dL
Hgb urine dipstick: NEGATIVE
Ketones, ur: NEGATIVE mg/dL
Leukocytes,Ua: NEGATIVE
Nitrite: POSITIVE — AB
Protein, ur: NEGATIVE mg/dL
Specific Gravity, Urine: 1.01 (ref 1.005–1.030)
WBC, UA: >50 WBC/hpf (ref 0–5)
pH: 5 (ref 5.0–8.0)

## 2023-08-01 LAB — BASIC METABOLIC PANEL WITH GFR
Anion gap: 7 (ref 5–15)
BUN: 18 mg/dL (ref 8–23)
CO2: 23 mmol/L (ref 22–32)
Calcium: 8.3 mg/dL — ABNORMAL LOW (ref 8.9–10.3)
Chloride: 107 mmol/L (ref 98–111)
Creatinine, Ser: 1.26 mg/dL — ABNORMAL HIGH (ref 0.61–1.24)
GFR, Estimated: 55 mL/min — ABNORMAL LOW (ref >60)
Glucose, Bld: 101 mg/dL — ABNORMAL HIGH (ref 70–99)
Potassium: 3.8 mmol/L (ref 3.5–5.1)
Sodium: 137 mmol/L (ref 135–145)

## 2023-08-01 LAB — CBC WITH DIFFERENTIAL/PLATELET
Abs Immature Granulocytes: 0.07 10*3/uL (ref 0.00–0.07)
Basophils Absolute: 0.1 10*3/uL (ref 0.0–0.1)
Basophils Relative: 1 %
Eosinophils Absolute: 0.2 10*3/uL (ref 0.0–0.5)
Eosinophils Relative: 2 %
HCT: 37.4 % — ABNORMAL LOW (ref 39.0–52.0)
Hemoglobin: 12.3 g/dL — ABNORMAL LOW (ref 13.0–17.0)
Immature Granulocytes: 1 %
Lymphocytes Relative: 19 %
Lymphs Abs: 1.7 10*3/uL (ref 0.7–4.0)
MCH: 30.4 pg (ref 26.0–34.0)
MCHC: 32.9 g/dL (ref 30.0–36.0)
MCV: 92.6 fL (ref 80.0–100.0)
Monocytes Absolute: 0.8 10*3/uL (ref 0.1–1.0)
Monocytes Relative: 9 %
Neutro Abs: 6.1 10*3/uL (ref 1.7–7.7)
Neutrophils Relative %: 68 %
Platelets: 369 10*3/uL (ref 150–400)
RBC: 4.04 MIL/uL — ABNORMAL LOW (ref 4.22–5.81)
RDW: 13.2 % (ref 11.5–15.5)
WBC: 8.9 10*3/uL (ref 4.0–10.5)
nRBC: 0 % (ref 0.0–0.2)

## 2023-08-01 MED ORDER — SODIUM CHLORIDE 0.9 % IV BOLUS
1000.0000 mL | Freq: Once | INTRAVENOUS | Status: AC
  Administered 2023-08-01: 1000 mL via INTRAVENOUS

## 2023-08-01 MED ORDER — CEPHALEXIN 500 MG PO CAPS: 500.0000 mg | ORAL_CAPSULE | Freq: Four times a day (QID) | ORAL | 0 refills | Status: AC

## 2023-08-01 MED ORDER — SODIUM CHLORIDE 0.9 % IV SOLN: INTRAVENOUS | Status: DC

## 2023-08-01 NOTE — ED Provider Notes (Signed)
 [place] EMERGENCY DEPARTMENT AT [facility] Provider Note   CSN: [id number] Arrival date & time: 08/01/23  1015     History  Chief Complaint  Patient presents with   UTI    Alex Oconnor is a 89 y.o. male.  89 year old male who presents with dysuria and suprapubic pressure with concern for possible pyelonephritis.  Had symptoms starting this week after having his Foley catheter removed.  Went to urgent care at 2 days ago and was placed on ciprofloxacin  for which he has had 2 doses.  States he has had some lower extremity discomfort as well as some paresthesias in his hands.  Denies any fever.  Does note some diarrhea.  According to his wife was at bedside patient white count of 18,000.  Was seen again today for a follow-up visit and they are concerned about pyelonephritis and he was sent here       Home Medications Prior to Admission medications   Medication Sig Start Date End Date Taking? Authorizing Provider  albuterol  (VENTOLIN  HFA) 108 (90 Base) MCG/ACT inhaler Inhale 2 puffs into the lungs every 6 (six) hours as needed for wheezing or shortness of breath. 11/04/20   Leath-Warren, Belen Bowers, NP  aspirin  81 MG tablet Take 81 mg by mouth daily.    [provider]  benazepril  (LOTENSIN ) 10 MG tablet Take 10 mg by mouth daily. 07/25/14   [provider]  budesonide -formoterol (SYMBICORT) 160-4.5 MCG/ACT inhaler 2 puffs 05/11/20   [provider]  cholecalciferol  (VITAMIN D) 1000 UNITS tablet Take 1,000 Units by mouth daily.    [provider]  Coenzyme Q10 100 MG capsule Take 100 mg by mouth daily.    [provider]  COVID-19 mRNA bivalent vaccine, Pfizer, (PFIZER COVID-19 VAC BIVALENT) injection Inject into the muscle. 02/06/21   Liane Redman, MD  COVID-19 mRNA Vac-TriS, Pfizer, (PFIZER-BIONT COVID-19 VAC-TRIS) SUSP injection Inject into the muscle. Patient not taking: Reported on 11/04/2020 07/18/20   Liane Redman, MD   escitalopram (LEXAPRO) 10 MG tablet Take 1 tablet by mouth daily. 06/15/20   [provider]  fluticasone  (FLONASE ) 50 MCG/ACT nasal spray Place 1 spray into both nostrils daily as needed for allergies or rhinitis.    [provider]  fluticasone  (FLOVENT  HFA) 110 MCG/ACT inhaler Inhale 1 puff into the lungs daily as needed. Patient not taking: Reported on 11/04/2020    [provider]  LORazepam  (ATIVAN ) 1 MG tablet Take 0.5 mg by mouth at bedtime. Patient not taking: Reported on 11/04/2020    [provider]  metoCLOPramide  (REGLAN ) 10 MG tablet Take 1 tablet (10 mg total) by mouth every 8 (eight) hours as needed for up to 5 days for nausea or vomiting. 01/24/23 01/29/23  Rolinda Climes, DO  Omeprazole-Sodium Bicarbonate (ZEGERID) 20-1100 MG CAPS capsule Take 1 capsule by mouth daily before breakfast.    [provider]  Probiotic Product (PROBIOTIC PO) Take 1 tablet by mouth daily.    [provider]  saw palmetto  500 MG capsule Take 500 mg by mouth every morning.     [provider]  simvastatin  (ZOCOR ) 20 MG tablet Take 20 mg by mouth every morning.     [provider]      Allergies    Pantoprazole , Penicillins, Plavix  [clopidogrel  bisulfate], Ciprofloxacin , and Sulfa antibiotics    Review of Systems   Review of Systems  All other systems reviewed and are negative.   Physical Exam Updated Vital  Signs BP (!) 145/76 (BP Location: Right Arm)   Pulse 70   Temp 98 F (36.7 C) (Oral)   Resp 18   Ht 1.727 m (5\' 8" )   Wt 65.3 kg   SpO2 99%   BMI 21.90 kg/m  Physical Exam Vitals and nursing note reviewed.  Constitutional:      General: He is not in acute distress.    Appearance: Normal appearance. He is well-developed. He is not toxic-appearing.  HENT:     Head: Normocephalic and atraumatic.  Eyes:     General: Lids are normal.     Conjunctiva/sclera: Conjunctivae normal.     Pupils: Pupils are equal,  round, and reactive to light.  Neck:     Thyroid : No thyroid  mass.     Trachea: No tracheal deviation.  Cardiovascular:     Rate and Rhythm: Normal rate and regular rhythm.     Heart sounds: Normal heart sounds. No murmur heard.    No gallop.  Pulmonary:     Effort: Pulmonary effort is normal. No respiratory distress.     Breath sounds: Normal breath sounds. No stridor. No decreased breath sounds, wheezing, rhonchi or rales.  Abdominal:     General: There is no distension.     Palpations: Abdomen is soft.     Tenderness: There is no abdominal tenderness. There is no rebound.  Musculoskeletal:        General: No tenderness. Normal range of motion.     Cervical back: Normal range of motion and neck supple.  Skin:    General: Skin is warm and dry.     Findings: No abrasion or rash.  Neurological:     Mental Status: He is alert and oriented to person, place, and time. Mental status is at baseline.     GCS: GCS eye subscore is 4. GCS verbal subscore is 5. GCS motor subscore is 6.     Cranial Nerves: No cranial nerve deficit.     Sensory: No sensory deficit.     Motor: Motor function is intact.  Psychiatric:        Attention and Perception: Attention normal.        Speech: Speech normal.        Behavior: Behavior normal.     ED Results / Procedures / Treatments   Labs (all labs ordered are listed, but only abnormal results are displayed) Labs Reviewed  CBC WITH DIFFERENTIAL/PLATELET  BASIC METABOLIC PANEL WITH GFR  URINALYSIS, W/ REFLEX TO CULTURE (INFECTION SUSPECTED)    EKG None  Radiology No results found.  Procedures Procedures    Medications Ordered in ED Medications  0.9 %  sodium chloride  infusion (has no administration in time range)  sodium chloride  0.9 % bolus 1,000 mL (has no administration in time range)    ED Course/ Medical Decision Making/ A&P                                 Medical Decision Making Amount and/or Complexity of Data  Reviewed Labs: ordered. Radiology: ordered.  Risk Prescription drug management.   Patient presented with concern for possible nephritis.  He has normal white count at this time.  Urinalysis does show infection.  He has no flank pain.  Patient states he feels better after fluids.  Patient offered admission but states would like to go home and try oral antibiotics.  His family was at bedside and  he agreed with this.  Will prescribe him Keflex and he will follow-up with his doctor        Final Clinical Impression(s) / ED Diagnoses Final diagnoses:  None    Rx / DC Orders ED Discharge Orders     None         Lind Repine, MD 08/01/23 1428

## 2023-08-01 NOTE — ED Notes (Signed)
 Patient transported to CT

## 2023-08-01 NOTE — ED Triage Notes (Signed)
 Pt presents with ongoing UTI, Had a foley after  hernia repair at [facility]. Has been on Levaquin  until today due to reactions to other antibiotics. Pt reports diarrhea, bloating, belching (concerning due to recent surgery) They feel it is a reaction to the Levaquin 

## 2023-08-02 LAB — URINE CULTURE

## 2023-08-03 ENCOUNTER — Other Ambulatory Visit: Payer: Self-pay

## 2023-08-03 ENCOUNTER — Encounter (HOSPITAL_COMMUNITY): Payer: Self-pay | Admitting: Internal Medicine

## 2023-08-03 ENCOUNTER — Inpatient Hospital Stay (HOSPITAL_COMMUNITY)
Admission: EM | Admit: 2023-08-03 | Discharge: 2023-08-05 | DRG: 392 | Disposition: A | Discharge status: Home / Self Care | Attending: Internal Medicine | Admitting: Internal Medicine

## 2023-08-03 DIAGNOSIS — R338 Other retention of urine: Secondary | ICD-10-CM | POA: Diagnosis present

## 2023-08-03 DIAGNOSIS — K439 Ventral hernia without obstruction or gangrene: Secondary | ICD-10-CM | POA: Diagnosis present

## 2023-08-03 DIAGNOSIS — Z79899 Other long term (current) drug therapy: Secondary | ICD-10-CM

## 2023-08-03 DIAGNOSIS — Z8673 Personal history of transient ischemic attack (TIA), and cerebral infarction without residual deficits: Secondary | ICD-10-CM

## 2023-08-03 DIAGNOSIS — Z8249 Family history of ischemic heart disease and other diseases of the circulatory system: Secondary | ICD-10-CM

## 2023-08-03 DIAGNOSIS — K449 Diaphragmatic hernia without obstruction or gangrene: Secondary | ICD-10-CM | POA: Diagnosis present

## 2023-08-03 DIAGNOSIS — I110 Hypertensive heart disease with heart failure: Secondary | ICD-10-CM | POA: Diagnosis present

## 2023-08-03 DIAGNOSIS — E785 Hyperlipidemia, unspecified: Secondary | ICD-10-CM | POA: Diagnosis present

## 2023-08-03 DIAGNOSIS — K219 Gastro-esophageal reflux disease without esophagitis: Secondary | ICD-10-CM | POA: Diagnosis present

## 2023-08-03 DIAGNOSIS — G4730 Sleep apnea, unspecified: Secondary | ICD-10-CM | POA: Diagnosis present

## 2023-08-03 DIAGNOSIS — Z880 Allergy status to penicillin: Secondary | ICD-10-CM

## 2023-08-03 DIAGNOSIS — J45909 Unspecified asthma, uncomplicated: Secondary | ICD-10-CM | POA: Diagnosis present

## 2023-08-03 DIAGNOSIS — I10 Essential (primary) hypertension: Secondary | ICD-10-CM | POA: Diagnosis present

## 2023-08-03 DIAGNOSIS — E860 Dehydration: Secondary | ICD-10-CM | POA: Diagnosis not present

## 2023-08-03 DIAGNOSIS — Z882 Allergy status to sulfonamides status: Secondary | ICD-10-CM

## 2023-08-03 DIAGNOSIS — Z87891 Personal history of nicotine dependence: Secondary | ICD-10-CM

## 2023-08-03 DIAGNOSIS — E876 Hypokalemia: Secondary | ICD-10-CM | POA: Diagnosis present

## 2023-08-03 DIAGNOSIS — Z7951 Long term (current) use of inhaled steroids: Secondary | ICD-10-CM

## 2023-08-03 DIAGNOSIS — Z87440 Personal history of urinary (tract) infections: Secondary | ICD-10-CM

## 2023-08-03 DIAGNOSIS — Z881 Allergy status to other antibiotic agents status: Secondary | ICD-10-CM

## 2023-08-03 DIAGNOSIS — R197 Diarrhea, unspecified: Secondary | ICD-10-CM | POA: Diagnosis not present

## 2023-08-03 DIAGNOSIS — K580 Irritable bowel syndrome with diarrhea: Principal | ICD-10-CM | POA: Diagnosis present

## 2023-08-03 DIAGNOSIS — K3184 Gastroparesis: Secondary | ICD-10-CM | POA: Diagnosis present

## 2023-08-03 DIAGNOSIS — K529 Noninfective gastroenteritis and colitis, unspecified: Secondary | ICD-10-CM | POA: Diagnosis not present

## 2023-08-03 DIAGNOSIS — Z7982 Long term (current) use of aspirin: Secondary | ICD-10-CM

## 2023-08-03 DIAGNOSIS — Z66 Do not resuscitate: Secondary | ICD-10-CM | POA: Diagnosis present

## 2023-08-03 DIAGNOSIS — K440 Diaphragmatic hernia with obstruction, without gangrene: Secondary | ICD-10-CM | POA: Diagnosis present

## 2023-08-03 DIAGNOSIS — I5032 Chronic diastolic (congestive) heart failure: Secondary | ICD-10-CM | POA: Diagnosis present

## 2023-08-03 DIAGNOSIS — Z9079 Acquired absence of other genital organ(s): Secondary | ICD-10-CM

## 2023-08-03 DIAGNOSIS — N390 Urinary tract infection, site not specified: Secondary | ICD-10-CM | POA: Diagnosis present

## 2023-08-03 DIAGNOSIS — Z955 Presence of coronary angioplasty implant and graft: Secondary | ICD-10-CM

## 2023-08-03 DIAGNOSIS — I2510 Atherosclerotic heart disease of native coronary artery without angina pectoris: Secondary | ICD-10-CM | POA: Diagnosis not present

## 2023-08-03 DIAGNOSIS — B9689 Other specified bacterial agents as the cause of diseases classified elsewhere: Secondary | ICD-10-CM | POA: Diagnosis present

## 2023-08-03 DIAGNOSIS — Z888 Allergy status to other drugs, medicaments and biological substances status: Secondary | ICD-10-CM

## 2023-08-03 LAB — CBC
HCT: 42.8 % (ref 39.0–52.0)
Hemoglobin: 14 g/dL (ref 13.0–17.0)
MCH: 30.7 pg (ref 26.0–34.0)
MCHC: 32.7 g/dL (ref 30.0–36.0)
MCV: 93.9 fL (ref 80.0–100.0)
Platelets: 470 10*3/uL — ABNORMAL HIGH (ref 150–400)
RBC: 4.56 MIL/uL (ref 4.22–5.81)
RDW: 13.4 % (ref 11.5–15.5)
WBC: 9.8 10*3/uL (ref 4.0–10.5)
nRBC: 0 % (ref 0.0–0.2)

## 2023-08-03 LAB — C DIFFICILE QUICK SCREEN W PCR REFLEX
C Diff antigen: NEGATIVE
C Diff interpretation: NOT DETECTED
C Diff toxin: NEGATIVE

## 2023-08-03 LAB — COMPREHENSIVE METABOLIC PANEL WITH GFR
ALT: 18 U/L (ref 0–44)
AST: 21 U/L (ref 15–41)
Albumin: 3.6 g/dL (ref 3.5–5.0)
Alkaline Phosphatase: 76 U/L (ref 38–126)
Anion gap: 13 (ref 5–15)
BUN: 18 mg/dL (ref 8–23)
CO2: 22 mmol/L (ref 22–32)
Calcium: 8.9 mg/dL (ref 8.9–10.3)
Chloride: 105 mmol/L (ref 98–111)
Creatinine, Ser: 1.04 mg/dL (ref 0.61–1.24)
GFR, Estimated: >60 mL/min (ref >60)
Glucose, Bld: 99 mg/dL (ref 70–99)
Potassium: 3.6 mmol/L (ref 3.5–5.1)
Sodium: 140 mmol/L (ref 135–145)
Total Bilirubin: 0.4 mg/dL (ref 0.0–1.2)
Total Protein: 7.9 g/dL (ref 6.5–8.1)

## 2023-08-03 LAB — MAGNESIUM: Magnesium: 1.9 mg/dL (ref 1.7–2.4)

## 2023-08-03 LAB — URINALYSIS, ROUTINE W REFLEX MICROSCOPIC
Bilirubin Urine: NEGATIVE
Glucose, UA: NEGATIVE mg/dL
Ketones, ur: NEGATIVE mg/dL
Nitrite: NEGATIVE
Protein, ur: NEGATIVE mg/dL
Specific Gravity, Urine: 1.01 (ref 1.005–1.030)
pH: 5 (ref 5.0–8.0)

## 2023-08-03 LAB — TSH: TSH: 1.755 u[IU]/mL (ref 0.350–4.500)

## 2023-08-03 LAB — PHOSPHORUS: Phosphorus: 4 mg/dL (ref 2.5–4.6)

## 2023-08-03 LAB — CK: Total CK: 86 U/L (ref 49–397)

## 2023-08-03 MED ORDER — FLUTICASONE FUROATE-VILANTEROL 200-25 MCG/ACT IN AEPB
1.0000 | INHALATION_SPRAY | Freq: Every day | RESPIRATORY_TRACT | Status: DC
  Administered 2023-08-04 – 2023-08-05 (×2): 1 via RESPIRATORY_TRACT
  Filled 2023-08-03: qty 28

## 2023-08-03 MED ORDER — ACETAMINOPHEN 325 MG PO TABS: 650.0000 mg | ORAL_TABLET | Freq: Four times a day (QID) | ORAL | Status: DC | PRN

## 2023-08-03 MED ORDER — PYRIDOSTIGMINE BROMIDE 60 MG PO TABS
60.0000 mg | ORAL_TABLET | Freq: Three times a day (TID) | ORAL | Status: DC
  Administered 2023-08-04 – 2023-08-05 (×4): 60 mg via ORAL
  Filled 2023-08-03 (×6): qty 1

## 2023-08-03 MED ORDER — LACTATED RINGERS IV BOLUS
1000.0000 mL | Freq: Once | INTRAVENOUS | Status: AC
  Administered 2023-08-03: 1000 mL via INTRAVENOUS

## 2023-08-03 MED ORDER — SODIUM CHLORIDE 0.9 % IV SOLN: INTRAVENOUS | Status: AC

## 2023-08-03 MED ORDER — ACETAMINOPHEN 650 MG RE SUPP: 650.0000 mg | Freq: Four times a day (QID) | RECTAL | Status: DC | PRN

## 2023-08-03 MED ORDER — HYDROCODONE-ACETAMINOPHEN 5-325 MG PO TABS: 1.0000 | ORAL_TABLET | ORAL | Status: DC | PRN

## 2023-08-03 MED ORDER — ONDANSETRON HCL 4 MG/2ML IJ SOLN: 4.0000 mg | Freq: Four times a day (QID) | INTRAMUSCULAR | Status: DC | PRN

## 2023-08-03 MED ORDER — ONDANSETRON HCL 4 MG PO TABS: 4.0000 mg | ORAL_TABLET | Freq: Four times a day (QID) | ORAL | Status: DC | PRN

## 2023-08-03 NOTE — Subjective & Objective (Signed)
 Pt recently admitted to [facility] with hernia repair, needed foley due to urinary retention was able to DC foley at follow up on 07/27/2023 UA was suspicious for UTI started on Levaquin  07/30/2023 (cipro  causes rash) had 2 doses and started to have  lower extremity discomfort as well as some paresthesias in his hands.  As well as diarrhea WBC 18    CT abd 2 days ago non acute US  still was worrisome for UTI  Pt felt that he would get better and was dc to home on KEflex instead He continued to have  severe diarrhea 10-20 BM watery per day and came back to ER

## 2023-08-03 NOTE — Assessment & Plan Note (Signed)
 States asymptomatic Send urine for culture Given ongoing diarrhea and no symptoms of UTI we will hold off on antibiotics for now

## 2023-08-03 NOTE — ED Provider Notes (Signed)
 [place] EMERGENCY DEPARTMENT AT [facility] Provider Note   CSN: [id number] Arrival date & time: 08/03/23  1552     History No chief complaint on file.   Alex Oconnor is a 89 y.o. male with h/o HTN presents emerged from today for evaluation of continued diarrhea.  Patient had hiatal hernia repair on 5-28 with [facility].  Had prolonged indwelling urinary catheter for around 10 days to have it removed and then was having urinary symptoms.  He was put on levofloxacin  (?) for a few days and then was having diarrhea.  His wife reports that he was suffering with some diarrhea in the postop setting as well.  Reports that he did have a temperature of around 100.4 with some chills around 5 days previous to today.  He was seen here 2 days ago and was offered admission however declined as he thought he would get better when he got home.  He has had no worsening of his symptoms however he has not had any improvement.  He is having around greater than 20 episodes of nonbloody or black stools.  He saying his stomach is feeling more discomfort and is making loud noises more than this pain.  No nausea or vomiting but is having decreased p.o. intake.  Wife reports that he has been urinating less but is not having any discomfort with doing so.  No other reported fevers but wife's been giving him some Tylenol .  They were seen at [facility] walk-in clinic again where sent to the ER for admission for potential C. difficile.  HPI     Home Medications Prior to Admission medications   Medication Sig Start Date End Date Taking? Authorizing Provider  albuterol  (VENTOLIN  HFA) 108 (90 Base) MCG/ACT inhaler Inhale 2 puffs into the lungs every 6 (six) hours as needed for wheezing or shortness of breath. 11/04/20   Leath-Warren, Belen Bowers, NP  aspirin  81 MG tablet Take 81 mg by mouth daily.    [provider]  benazepril  (LOTENSIN ) 10 MG tablet Take 10 mg by mouth daily. 07/25/14   [provider]   budesonide -formoterol (SYMBICORT) 160-4.5 MCG/ACT inhaler 2 puffs 05/11/20   [provider]  cephALEXin (KEFLEX) 500 MG capsule Take 1 capsule (500 mg total) by mouth 4 (four) times daily. 08/01/23   Lind Repine, MD  cholecalciferol  (VITAMIN D) 1000 UNITS tablet Take 1,000 Units by mouth daily.    [provider]  Coenzyme Q10 100 MG capsule Take 100 mg by mouth daily.    [provider]  COVID-19 mRNA bivalent vaccine, Pfizer, (PFIZER COVID-19 VAC BIVALENT) injection Inject into the muscle. 02/06/21   Liane Redman, MD  COVID-19 mRNA Vac-TriS, Pfizer, (PFIZER-BIONT COVID-19 VAC-TRIS) SUSP injection Inject into the muscle. Patient not taking: Reported on 11/04/2020 07/18/20   Liane Redman, MD  escitalopram (LEXAPRO) 10 MG tablet Take 1 tablet by mouth daily. 06/15/20   [provider]  fluticasone  (FLONASE ) 50 MCG/ACT nasal spray Place 1 spray into both nostrils daily as needed for allergies or rhinitis.    [provider]  fluticasone  (FLOVENT  HFA) 110 MCG/ACT inhaler Inhale 1 puff into the lungs daily as needed. Patient not taking: Reported on 11/04/2020    [provider]  LORazepam  (ATIVAN ) 1 MG tablet Take 0.5 mg by mouth at bedtime. Patient not taking: Reported on 11/04/2020    [provider]  metoCLOPramide  (REGLAN ) 10 MG tablet Take 1 tablet (10 mg total) by mouth every 8 (eight) hours  as needed for up to 5 days for nausea or vomiting. 01/24/23 01/29/23  Rolinda Climes, DO  Omeprazole-Sodium Bicarbonate (ZEGERID) 20-1100 MG CAPS capsule Take 1 capsule by mouth daily before breakfast.    [provider]  Probiotic Product (PROBIOTIC PO) Take 1 tablet by mouth daily.    [provider]  saw palmetto  500 MG capsule Take 500 mg by mouth every morning.     [provider]  simvastatin  (ZOCOR ) 20 MG tablet Take 20 mg by mouth every morning.     [provider]      Allergies    Pantoprazole ,  Penicillins, Plavix  [clopidogrel  bisulfate], Ciprofloxacin , and Sulfa antibiotics    Review of Systems   Review of Systems  Constitutional:  Negative for chills and fever.  Respiratory:  Negative for shortness of breath.   Cardiovascular:  Negative for chest pain.  Gastrointestinal:  Positive for abdominal pain and diarrhea. Negative for anal bleeding, blood in stool, nausea and vomiting.  Genitourinary:  Positive for decreased urine volume. Negative for dysuria and hematuria.    Physical Exam Updated Vital Signs BP (!) 162/90 (BP Location: Left Arm)   Pulse 85   Temp 97.8 F (36.6 C) (Oral)   Resp 16   Ht 5' 7.5" (1.715 m)   Wt 67.3 kg   SpO2 99%   BMI 22.90 kg/m  Physical Exam Vitals and nursing note reviewed.  Constitutional:      General: He is not in acute distress.    Appearance: He is not toxic-appearing.     Comments: Pleasant, appears well for age  HENT:     Mouth/Throat:     Mouth: Mucous membranes are dry.  Eyes:     General: No scleral icterus. Cardiovascular:     Rate and Rhythm: Normal rate.  Pulmonary:     Effort: Pulmonary effort is normal. No respiratory distress.  Abdominal:     Palpations: Abdomen is soft.     Tenderness: There is no guarding or rebound.     Comments: Reports diffuse discomfort upon palpation but there is no guarding or rebound.  No point tenderness.  Soft.  Has the need to have a bowel movement upon palpation.  Skin:    General: Skin is warm and dry.  Neurological:     Mental Status: He is alert.     Gait: Gait normal.     ED Results / Procedures / Treatments   Labs (all labs ordered are listed, but only abnormal results are displayed) Labs Reviewed  CBC - Abnormal; Notable for the following components:      Result Value   Platelets 470 (*)    All other components within normal limits  URINALYSIS, ROUTINE W REFLEX MICROSCOPIC - Abnormal; Notable for the following components:   APPearance HAZY (*)    Hgb urine dipstick  SMALL (*)    Leukocytes,Ua SMALL (*)    Bacteria, UA RARE (*)    All other components within normal limits  C DIFFICILE QUICK SCREEN W PCR REFLEX    GASTROINTESTINAL PANEL BY PCR, STOOL (REPLACES STOOL CULTURE)  URINE CULTURE  COMPREHENSIVE METABOLIC PANEL WITH GFR    EKG None  Radiology No results found.  Procedures Procedures   Medications Ordered in ED Medications - No data to display  ED Course/ Medical Decision Making/ A&P    Medical Decision Making Amount and/or Complexity of Data Reviewed Labs: ordered. Radiology: ordered.  Risk Decision regarding hospitalization.   89 y.o. male  presents to the ER for evaluation of diarrhea. Differential diagnosis includes but is not limited to Infectious diarrhea, GI Bleed, Appendicitis, Mesenteric Ischemia, Diverticulitis, endocrine causes (adrenal, thyroid ), IBD. Vital signs blood pressure 160/90 otherwise unremarkable. Physical exam as noted above.   On previous chart evaluation, patient was seen on 08/01/23.  For similar symptoms.  Was offered admission however declined and wanted to be treated at home.  He denies any worsening of his symptoms but reports they have not improved any.  He went back to [place] walk-in and encouraged him to be admitted to the ER.  His CT imaging did not show any acute findings however was concern for a hernia.  On my abdominal examination, do not feel any large mass or hernia of the abdomen.  It is soft.  Reports more, "discomfort than pain" upon palpation. Soft. Labs ordered.  Patient has been here for an hour and a half and has already had 4 bowel movements.  I independently reviewed and interpreted the patient's labs.  CBC shows prior [birth year]27/1966 which is new increased, could be inflammatory process.  No leukocytosis or anemia.  Patient did have hemoglobin of 12.3 just 2 days prior, could be more hemoconcentration with dehydration from diarrhea.  CMP shows no electrolyte or LFT abnormalities.  Urinalysis is  hazy urine with small amount hemoglobin present.  Small amount leukocytes with 1120 white blood cells but rare bacteria.  This does appear different from patient's previous urine which is nitrate positive and greater than 50 white blood cells but that was it.  I have added on a urine culture.  I have also added on C. difficile and gastro PCR panel.  Patient recently had CT imaging of his belly 2 days ago that showed 1. No hydronephrosis or nephrolithiasis. 2. Air within the urinary bladder may have been introduced by recent instrumentation. Correlation with urinalysis recommended to exclude cystitis. 3. Colonic diverticulosis. No bowel obstruction. Normal appendix. 4. Small fat containing ventral hernia to the right of the midline. Correlation with clinical exam and point tenderness recommended to exclude strangulation/incarceration. 5. Soft tissue thickening of the GE junction may be postsurgical. Further evaluation with upper GI study recommended.  Patient has very minimal tenderness with on palpation to the abdomen just describes it as more uncomfortable than painful.  It does make him feel that he needs to have a bowel movement.  He is not having nausea or vomiting but has had decreased p.o. intake because of his diarrhea with more than 20 episodes per day.  Not black or bloody.  Given his recent antibiotic use and hospitalization, and concern for C. difficile.  Given his age it does put him at risk for dehydration or possible falls. Will order CT abd to r/o SBO with the questionable hernia seen on previous imaging after speaking with Dr. Hendrick Locke.  Will admit to [facility] hospitalist.  Admitted to Dr. Hendrick Locke.   Portions of this report may have been transcribed using voice recognition software. Every effort was made to ensure accuracy; however, inadvertent computerized transcription errors may be present.    Final Clinical Impression(s) / ED Diagnoses Final diagnoses:  Dehydration  Diarrhea,  unspecified type    Rx / DC Orders ED Discharge Orders     None         Spence Dux, PA-C 08/03/23 2020    Almond Army, MD 08/04/23 [phone]

## 2023-08-03 NOTE — Plan of Care (Signed)
  Problem: Education: Goal: Knowledge of General Education information will improve Description: Including pain rating scale, medication(s)/side effects and non-pharmacologic comfort measures 08/03/2023 2257 by Karren Paddock, RN Outcome: Progressing 08/03/2023 2256 by Karren Paddock, RN Outcome: Progressing   Problem: Activity: Goal: Risk for activity intolerance will decrease 08/03/2023 2257 by Karren Paddock, RN Outcome: Progressing 08/03/2023 2256 by Karren Paddock, RN Outcome: Progressing   Problem: Coping: Goal: Level of anxiety will decrease 08/03/2023 2257 by Karren Paddock, RN Outcome: Progressing 08/03/2023 2256 by Karren Paddock, RN Outcome: Progressing   Problem: Safety: Goal: Ability to remain free from injury will improve Outcome: Progressing   Problem: Skin Integrity: Goal: Risk for impaired skin integrity will decrease Outcome: Progressing

## 2023-08-03 NOTE — Assessment & Plan Note (Signed)
Resume aspirin and statin. 

## 2023-08-03 NOTE — Assessment & Plan Note (Signed)
 Status post repair

## 2023-08-03 NOTE — Assessment & Plan Note (Signed)
 Mildly tender will obtain CT to eval for any evidence of bowel incarceration

## 2023-08-03 NOTE — Assessment & Plan Note (Signed)
 Gastric panel and C. difficile ordered CT abdomen pelvis ordered Will notify GI as patient has had issues with recurrent diarrhea

## 2023-08-03 NOTE — ED Triage Notes (Signed)
 Patient complains of diarrhea x 7 days. Patient hernia surgery on May 28th and sent home with indwelling cath, had removed and has been on antibiotics and now is having diarrhea that is not getting any better. Patient was seen in [facility] ED on Saturday and discussed admitting for observation vs being discharged. Patient chose to go home and the diarrhea has not stopped. Patient is still on antibiotics and having non stop diarrhea, and went to walk in clinic and was sent to ED. Patient concerned about C Diff. JRPRN

## 2023-08-03 NOTE — Assessment & Plan Note (Signed)
 Chronic stable On aspirin  81 mg po qday Continue zocor 

## 2023-08-03 NOTE — Assessment & Plan Note (Signed)
Rehydrate and follow fluid status 

## 2023-08-03 NOTE — H&P (Signed)
 Alex Oconnor QIO:[id number] DOB: [birth year] DOA: 08/03/2023     PCP: Benedetta Bradley, MD   Outpatient Specialists:   GI Dr.  Dellis Fermo   Patient arrived to ER on 08/03/23 at 1552 Referred by Attending Almond Army, MD   Patient coming from:    Lives   With family  [place]    Chief Complaint:  diarrhea   HPI: Alex Oconnor is a 89 y.o. male with medical history significant of  Hiatal hernia sp repair, GERD,  CAD, HTN, hx of Urinary retention, HLD  Presented with   diarrhea Pt recently admitted to [facility] with hernia repair, needed foley due to urinary retention was able to DC foley at follow up on 07/27/2023 UA was suspicious for UTI started on Levaquin  07/30/2023 (cipro  causes rash) had 2 doses and started to have  lower extremity discomfort as well as some paresthesias in his hands.  As well as diarrhea WBC 18    CT abd 2 days ago non acute US  still was worrisome for UTI  Pt felt that he would get better and was dc to home on KEflex instead He continued to have  severe diarrhea 10-20 BM watery per day and came back to ER  Denies any dysuria No fevers or chills.  States that the diarrhea started even prior to antibiotics and he has been having intermittent diarrhea even while Hospitalized  He has been feeling more weak  Has to use a walker although at baseline was walking independently Denies significant ETOH intake   Does not smoke   Denies marijuana use      Regarding pertinent Chronic problems:    Hyperlipidemia -  on statins Zocor  (simvastatin )  Lipid Panel     Component Value Date/Time   CHOL 103 12/07/2011 0715   TRIG 147 12/07/2011 0715   HDL 25 (L) 12/07/2011 0715   CHOLHDL 4.1 12/07/2011 0715   VLDL 29 12/07/2011 0715   LDLCALC 49 12/07/2011 0715     HTN on Telmisartan   chronic CHF diastolic   - last echo  Recent Results (from the past 32440 hours)  ECHOCARDIOGRAM COMPLETE   Collection Time: 05/15/20  1:52 PM  Result Value   Area-P  1/2 2.60   S' Lateral 2.80   Narrative      ECHOCARDIOGRAM REPORT        1. Left ventricular ejection fraction, by estimation, is 55 to 60%. The left ventricle has normal function. The left ventricle has no regional wall motion abnormalities. There is mild asymmetric left ventricular hypertrophy. Left ventricular diastolic  parameters are consistent with Grade I diastolic dysfunction (impaired relaxation).  2. Right ventricular systolic function is normal. The right ventricular size is normal. There is normal pulmonary artery systolic pressure.  3. The mitral valve is normal in structure. Mild mitral valve regurgitation.  4. The aortic valve is normal in structure. There is mild calcification of the aortic valve. Aortic valve regurgitation is not visualized.  5. Aortic dilatation noted. There is mild dilatation of the ascending aorta, measuring 41 mm.            CAD  - On Aspirin , statin,        While in ER:    Continues to have watery BM's UA with WBC and rare bacteria    Lab Orders         C Difficile Quick Screen w PCR reflex         Gastrointestinal  Panel by PCR , Stool         Urine Culture         Comprehensive metabolic panel         CBC         Urinalysis, Routine w reflex microscopic -Urine, Clean Catch       CTabd/pelvis -  . Air within the urinary bladder may have been introduced by recent instrumentation.  Small fat containing ventral hernia to the right of the midline. Correlation with clinical exam and point tenderness recommended to exclude strangulation/incarceration.    Following Medications were ordered in ER: Medications  lactated ringers  bolus 1,000 mL (1,000 mLs Intravenous New Bag/Given 08/03/23 1859)        ED Triage Vitals  Encounter Vitals Group     BP 08/03/23 1556 (!) 162/90     Systolic BP Percentile --      Diastolic BP Percentile --      Pulse Rate 08/03/23 1556 85     Resp 08/03/23 1556 16     Temp 08/03/23 1556 97.8 F (36.6 C)      Temp Source 08/03/23 1556 Oral     SpO2 08/03/23 1556 99 %     Weight 08/03/23 1556 148 lb 6.4 oz (67.3 kg)     Height 08/03/23 1556 5' 7.5" (1.715 m)     Head Circumference --      Peak Flow --      Pain Score 08/03/23 1621 3     Pain Loc --      Pain Education --      Exclude from Growth Chart --   ZOXW(96)@     _________________________________________ Significant initial  Findings: Abnormal Labs Reviewed  CBC - Abnormal; Notable for the following components:      Result Value   Platelets 470 (*)    All other components within normal limits  URINALYSIS, ROUTINE W REFLEX MICROSCOPIC - Abnormal; Notable for the following components:   APPearance HAZY (*)    Hgb urine dipstick SMALL (*)    Leukocytes,Ua SMALL (*)    Bacteria, UA RARE (*)    All other components within normal limits      ECG: Ordered  The recent clinical data is shown below. Vitals:   08/03/23 1556  BP: (!) 162/90  Pulse: 85  Resp: 16  Temp: 97.8 F (36.6 C)  TempSrc: Oral  SpO2: 99%  Weight: 67.3 kg  Height: 5' 7.5" (1.715 m)    WBC     Component Value Date/Time   WBC 9.8 08/03/2023 1645   LYMPHSABS 1.7 08/01/2023 1218   MONOABS 0.8 08/01/2023 1218   EOSABS 0.2 08/01/2023 1218   BASOSABS 0.1 08/01/2023 1218      UA possible UTI      Urine analysis:    Component Value Date/Time   COLORURINE YELLOW 08/03/2023 1646   APPEARANCEUR HAZY (A) 08/03/2023 1646   LABSPEC 1.010 08/03/2023 1646   PHURINE 5.0 08/03/2023 1646   GLUCOSEU NEGATIVE 08/03/2023 1646   HGBUR SMALL (A) 08/03/2023 1646   BILIRUBINUR NEGATIVE 08/03/2023 1646   KETONESUR NEGATIVE 08/03/2023 1646   PROTEINUR NEGATIVE 08/03/2023 1646   NITRITE NEGATIVE 08/03/2023 1646   LEUKOCYTESUR SMALL (A) 08/03/2023 1646    Results for orders placed or performed during the hospital encounter of 08/01/23  Urine Culture     Status: None   Collection Time: 08/01/23 12:18 PM   Specimen: Urine, Random  Result Value Ref Range Status  Specimen Description   Final    URINE, RANDOM Performed at [facility], [address], [place], [place] [address]    Special Requests URINE, CLEAN CATCH  Final   Culture   Final    NO GROWTH Performed at [facility], [address], [place], [place] [address]    Report Status 08/02/2023 FINAL  Final    _____________________________________________ Recent Labs  Lab 08/01/23 1218 08/03/23 1645  NA 137 140  K 3.8 3.6  CO2 23 22  GLUCOSE 101* 99  BUN 18 18  CREATININE 1.26* 1.04  CALCIUM 8.3* 8.9    Cr   stable,  Up from baseline see below Lab Results  Component Value Date   CREATININE 1.04 08/03/2023   CREATININE 1.26 (H) 08/01/2023   CREATININE 1.30 (H) 01/24/2023    Recent Labs  Lab 08/03/23 1645  AST 21  ALT 18  ALKPHOS 76  BILITOT 0.4  PROT 7.9  ALBUMIN 3.6   Lab Results  Component Value Date   CALCIUM 8.9 08/03/2023       Plt: Lab Results  Component Value Date   PLT 470 (H) 08/03/2023    Recent Labs  Lab 08/01/23 1218 08/03/23 1645  WBC 8.9 9.8  NEUTROABS 6.1  --   HGB 12.3* 14.0  HCT 37.4* 42.8  MCV 92.6 93.9  PLT 369 470*    HG/HCT  stable,       Component Value Date/Time   HGB 14.0 08/03/2023 1645   HCT 42.8 08/03/2023 1645   MCV 93.9 08/03/2023 1645    _______________________________________________ Hospitalist was called for admission for severe diarrhea   The following Work up has been ordered so far:  Orders Placed This Encounter  Procedures   C Difficile Quick Screen w PCR reflex   Gastrointestinal Panel by PCR , Stool   Urine Culture   Comprehensive metabolic panel   CBC   Urinalysis, Routine w reflex microscopic -Urine, Clean Catch   Diet NPO time specified   Saline Lock IV, Maintain IV access (when placed in a treatment room)   Consult to hospitalist   Enteric precautions (UV disinfection) C difficile, Norovirus     OTHER Significant initial  Findings:  labs showing:     DM  labs:   HbA1C: No results for input(s): "HGBA1C" in the last 8760 hours.     CBG (last 3)  No results for input(s): "GLUCAP" in the last 72 hours.        Cultures:    Component Value Date/Time   SDES  08/01/2023 1218    URINE, RANDOM Performed at [facility], [address], [place], [place] [address]    [facility] URINE, CLEAN CATCH 08/01/2023 1218   CULT  08/01/2023 1218    NO GROWTH Performed at [facility], [address], [place], [place] [address]    REPTSTATUS 08/02/2023 FINAL 08/01/2023 1218     Radiological Exams on Admission: No results found. _______________________________________________________________________________________________________ Latest  Blood pressure (!) 162/90, pulse 85, temperature 97.8 F (36.6 C), temperature source Oral, resp. rate 16, height 5' 7.5" (1.715 m), weight 67.3 kg, SpO2 99%.   Vitals  labs and radiology finding personally reviewed  Review of Systems:    Pertinent positives include:   fatigue, , abdominal pain,diarrhea,  Constitutional:  No weight loss, night sweats, Fevers, chills, weight loss  HEENT:  No headaches, Difficulty swallowing,Tooth/dental problems,Sore throat,  No sneezing, itching, ear ache, nasal congestion, post  nasal drip,  Cardio-vascular:  No chest pain, Orthopnea, PND, anasarca, dizziness, palpitations.no Bilateral lower extremity swelling  GI:  No heartburn, indigestion nausea, vomiting, change in bowel habits, loss of appetite, melena, blood in stool, hematemesis Resp:  no shortness of breath at rest. No dyspnea on exertion, No excess mucus, no productive cough, No non-productive cough, No coughing up of blood.No change in color of mucus.No wheezing. Skin:  no rash or lesions. No jaundice GU:  no dysuria, change in color of urine, no urgency or frequency. No straining to urinate.  No flank pain.  Musculoskeletal:  No joint pain or no joint swelling. No decreased range of motion.  No back pain.  Psych:  No change in mood or affect. No depression or anxiety. No memory loss.  Neuro: no localizing neurological complaints, no tingling, no weakness, no double vision, no gait abnormality, no slurred speech, no confusion  All systems reviewed and apart from HOPI all are negative _______________________________________________________________________________________________ Past Medical History:   Past Medical History:  Diagnosis Date   Arthritis    Asthma    Carotid artery occlusion    Cataract of both eyes    Complication of anesthesia    pts wife states BP drops; also had bout with pulse postop    Coronary artery disease    stent placed 1999   Dysrhythmia    Gastroparesis    GERD (gastroesophageal reflux disease)    Hard of hearing    Headache(784.0)    saw in ED 08/2006   History of hiatal hernia    Hyperlipemia    Hypertension    Irritable bowel syndrome    Numbness    left arm pts wife states MD aware and no issues identified   Rotator cuff disorder    left    Sleep apnea    10/2001; does not use CPAP   TIA (transient ischemic attack)    11/2011   Urinary hesitancy    Wears glasses      Past Surgical History:  Procedure Laterality Date   abd ultrasound      03/2007-xray of abd;01/2008-ultrasound of abd;01/2009-ultrasound of abd   BACK SURGERY     MRI lower back 09/2010;back injected 10/2010;11/2010 spinal lumbar fusion L4-5   CARDIAC CATHETERIZATION     May 1999   CARDIAC CATHETERIZATION N/A 08/23/2014   Procedure: Left Heart Cath and Coronary Angiography;  Surgeon: Arty Binning, MD;  Location: [facility] INVASIVE CV LAB;  Service: Cardiovascular;  Laterality: N/A;   CARDIOVASCULAR STRESS TEST     10/1999;09/2002   CAROTID ENDARTERECTOMY     right side 1997   COLONOSCOPY     colonscopy     02/2007   CORONARY STENT PLACEMENT     eyelid surgery     removal of ingrown eyelashes   GREEN LIGHT LASER TURP (TRANSURETHRAL RESECTION OF PROSTATE N/A 06/19/2015    Procedure: GREEN LIGHT LASER TURP (TRANSURETHRAL RESECTION OF PROSTATE;  Surgeon: Homero Luster, MD;  Location: [facility] ORS;  Service: Urology;  Laterality: N/A;   HERNIA REPAIR     2001; inguinal - right    HIATAL HERNIA REPAIR N/A 12/08/2013   Procedure: LAPAROSCOPIC REPAIR OF HIATAL HERNIA;  Surgeon: Azucena Bollard, MD;  Location: [facility];  Service: General;  Laterality: N/A;   injection of knees     06/2012 both knees injected;10/2013 both knees injected    INSERTION OF MESH N/A 12/08/2013   Procedure: INSERTION OF MESH;  Surgeon: Azucena Bollard, MD;  Location: [facility];  Service: General;  Laterality: N/A;   KNEE ARTHROSCOPY     right knee 06/2002;12/2005-left knee   right hip injected     08/2013   TONSILLECTOMY      Social History:  Ambulatory   independently lately  walker       reports that he quit smoking about 42 years ago. His smoking use included cigarettes. He started smoking about 62 years ago. He has a 40 pack-year smoking history. He has never used smokeless tobacco. He reports current alcohol  use. He reports that he does not use drugs.   Family History:   Family History  Problem Relation Age of Onset   Congestive Heart Failure Brother    ______________________________________________________________________________________________ Allergies: Allergies  Allergen Reactions   Pantoprazole  Rash   Prednisone Other (See Comments)    Severe insomnia   Penicillins Hives    Has patient had a PCN reaction causing immediate rash, facial/tongue/throat swelling, SOB or lightheadedness with hypotension:  Has patient had a PCN reaction causing severe rash involving mucus membranes or skin necrosis:  Has patient had a PCN reaction that required hospitalization  Has patient had a PCN reaction occurring within the last 10 years: No If all of the above answers are "NO", then may proceed with Cephalosporin use.    Plavix  [Clopidogrel  Bisulfate] Other (See Comments)    Developed a  taste disorder.   Ciprofloxacin  Rash   Sulfa Antibiotics Itching and Rash     Prior to Admission medications   Medication Sig Start Date End Date Taking? Authorizing Provider  albuterol  (VENTOLIN  HFA) 108 (90 Base) MCG/ACT inhaler Inhale 2 puffs into the lungs every 6 (six) hours as needed for wheezing or shortness of breath. 11/04/20  Yes Leath-Warren, Belen Bowers, NP  aspirin  81 MG tablet Take 81 mg by mouth daily.   Yes [provider]  budesonide -formoterol (SYMBICORT) 160-4.5 MCG/ACT inhaler Inhale 2 puffs into the lungs in the morning and at bedtime. 05/11/20  Yes [provider]  cephALEXin (KEFLEX) 500 MG capsule Take 1 capsule (500 mg total) by mouth 4 (four) times daily. 08/01/23  Yes Lind Repine, MD  cholecalciferol  (VITAMIN D) 1000 UNITS tablet Take 1,000 Units by mouth daily.   Yes [provider]  Coenzyme Q10 100 MG capsule Take 100 mg by mouth daily.   Yes [provider]  escitalopram (LEXAPRO) 10 MG tablet Take 1 tablet by mouth daily. 06/15/20  Yes [provider]  esomeprazole (NEXIUM) 40 MG capsule Take 40 mg by mouth daily at 12 noon.   Yes [provider]  fluticasone  (FLONASE ) 50 MCG/ACT nasal spray Place 1 spray into both nostrils daily as needed for allergies or rhinitis.   Yes [provider]  ondansetron  (ZOFRAN -ODT) 4 MG disintegrating tablet Take 4 mg by mouth every 8 (eight) hours as needed for nausea. 07/18/23  Yes [provider]  phenazopyridine (PYRIDIUM) 200 MG tablet Take 200 mg by mouth 3 (three) times daily with meals. 07/30/23  Yes [provider]  Probiotic Product (PROBIOTIC PO) Take 1 tablet by mouth daily.   Yes [provider]  pyridostigmine (MESTINON) 60 MG tablet Take 60 mg by mouth 3 (three) times daily. 02/26/23  Yes [provider]  saw palmetto  500 MG capsule Take 500 mg by mouth every morning.    Yes [provider]  simvastatin  (ZOCOR ) 20 MG  tablet Take 20 mg by mouth every morning.    Yes [provider]  telmisartan (MICARDIS) 40 MG tablet Take 40 mg by mouth daily.   Yes [provider]  levofloxacin  (LEVAQUIN ) 750 MG tablet Take 750 mg by mouth daily. Patient not taking: Reported on 08/03/2023    [provider]  metoCLOPramide  (REGLAN ) 10 MG tablet Take 1 tablet (10 mg total) by mouth every 8 (eight) hours as needed for up to 5 days for nausea or vomiting. 01/24/23 01/29/23  Rolinda Climes, DO    ___________________________________________________________________________________________________ Physical Exam:    08/03/2023    3:56 PM 08/01/2023    2:48 PM 08/01/2023    1:33 PM  Vitals with BMI  Height 5' 7.5"    Weight 148 lbs 6 oz    BMI 22.89    Systolic 162 146 098  Diastolic 90 82 72  Pulse 85 58 52     1. General:  in No  Acute distress     Chronically ill  -appearing 2. Psychological: Alert and   Oriented 3. Head/ENT:  Dry Mucous Membranes                          Head Non traumatic, neck supple                           Poor Dentition 4. SKIN:  decreased Skin turgor,  Skin clean Dry and intact no rash    5. Heart: Regular rate and rhythm no  Murmur, no Rub or gallop 6. Lungs:  no wheezes or crackles   7. Abdomen: Soft, mild ventral -tenderness, Non distended   obese  bowel sounds present 8. Lower extremities: no clubbing, cyanosis, no  edema 9. Neurologically Grossly intact, moving all 4 extremities equally  10. MSK: Normal range of motion    Chart has been reviewed  ______________________________________________________________________________________________  Assessment/Plan   89 y.o. 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 difficile ordered CT abdomen pelvis ordered Will notify GI as patient has had issues with recurrent diarrhea  Hypertension   allow permissive hypertension  Large type III mixed hiatus hernia with majority of stomach in chest Status post repair  History of TIA (transient ischemic attack) Resume aspirin  and statin  Dehydration Rehydrate and follow fluid status  History of UTI States asymptomatic Send urine for culture Given ongoing diarrhea and no symptoms of UTI we will hold off on antibiotics for now  Ventral hernia Mildly tender will obtain CT to eval for any evidence of bowel incarceration   Other plan as per orders.  DVT prophylaxis:  SCD    Code Status: DNR/DNI  as per patient   I had personally discussed CODE STATUS with patient and family  ACP  has been reviewed     Family Communication:   Family   at  Bedside  plan of care was discussed   with Wife,   Diet  Diet Orders (From admission, onward)     Start     Ordered  08/03/23 1624  Diet NPO time specified  Diet effective now        08/03/23 1624           Until had his CT scan  Disposition Plan:         Back to current facility when stable                             Following barriers for discharge:                                                   Will need to be able to tolerate PO                                                   Will need consultants to evaluate patient prior to discharge                      Consult Orders  (From admission, onward)           Start     Ordered   08/03/23 1904  Consult to hospitalist  Once       Provider:  (Not yet assigned)  Question Answer Comment  Place call to: [facility]   Reason for Consult Admit      08/03/23 1903                                Would benefit from PT/OT eval prior to DC  Ordered                     Consults called: sent msg to [facility]   Admission status:  ED Disposition     ED Disposition  Admit   Condition  --   Comment  Hospital Area: [facility] [[id number]]  Level of Care: Telemetry [5]  Admit to tele based on following criteria: Other see comments  Comments: dehydration  May place patient in observation at [facility] or [facility] if equivalent level of care is available:: No  Covid Evaluation: Asymptomatic - no recent exposure (last 10 days) testing not required  Diagnosis: Diarrhea [787.91.ICD-9-CM]  Admitting Physician: Daulton Harbaugh [[id number]]  Attending Physician: Avrielle Fry [[id number]]            Obs      Level of care     tele  For  24H     Melodee Lupe 08/03/2023, 8:58 PM    [facility]     after 2 AM please page floor coverage   If 7AM-7PM, please contact the day team taking care of the patient using [email or web address]

## 2023-08-03 NOTE — Assessment & Plan Note (Signed)
 allow permissive hypertension

## 2023-08-04 ENCOUNTER — Observation Stay (HOSPITAL_COMMUNITY)

## 2023-08-04 DIAGNOSIS — Z955 Presence of coronary angioplasty implant and graft: Secondary | ICD-10-CM | POA: Diagnosis not present

## 2023-08-04 DIAGNOSIS — K3184 Gastroparesis: Secondary | ICD-10-CM | POA: Diagnosis present

## 2023-08-04 DIAGNOSIS — E860 Dehydration: Secondary | ICD-10-CM | POA: Diagnosis present

## 2023-08-04 DIAGNOSIS — Z7951 Long term (current) use of inhaled steroids: Secondary | ICD-10-CM | POA: Diagnosis not present

## 2023-08-04 DIAGNOSIS — Z7982 Long term (current) use of aspirin: Secondary | ICD-10-CM | POA: Diagnosis not present

## 2023-08-04 DIAGNOSIS — R338 Other retention of urine: Secondary | ICD-10-CM | POA: Diagnosis present

## 2023-08-04 DIAGNOSIS — Z87891 Personal history of nicotine dependence: Secondary | ICD-10-CM | POA: Diagnosis not present

## 2023-08-04 DIAGNOSIS — Z8249 Family history of ischemic heart disease and other diseases of the circulatory system: Secondary | ICD-10-CM | POA: Diagnosis not present

## 2023-08-04 DIAGNOSIS — Z8673 Personal history of transient ischemic attack (TIA), and cerebral infarction without residual deficits: Secondary | ICD-10-CM | POA: Diagnosis not present

## 2023-08-04 DIAGNOSIS — I110 Hypertensive heart disease with heart failure: Secondary | ICD-10-CM | POA: Diagnosis present

## 2023-08-04 DIAGNOSIS — R197 Diarrhea, unspecified: Secondary | ICD-10-CM | POA: Diagnosis present

## 2023-08-04 DIAGNOSIS — I5032 Chronic diastolic (congestive) heart failure: Secondary | ICD-10-CM | POA: Diagnosis present

## 2023-08-04 DIAGNOSIS — K219 Gastro-esophageal reflux disease without esophagitis: Secondary | ICD-10-CM | POA: Diagnosis present

## 2023-08-04 DIAGNOSIS — N390 Urinary tract infection, site not specified: Secondary | ICD-10-CM | POA: Diagnosis present

## 2023-08-04 DIAGNOSIS — G4730 Sleep apnea, unspecified: Secondary | ICD-10-CM | POA: Diagnosis present

## 2023-08-04 DIAGNOSIS — B9689 Other specified bacterial agents as the cause of diseases classified elsewhere: Secondary | ICD-10-CM | POA: Diagnosis present

## 2023-08-04 DIAGNOSIS — I2510 Atherosclerotic heart disease of native coronary artery without angina pectoris: Secondary | ICD-10-CM | POA: Diagnosis present

## 2023-08-04 DIAGNOSIS — J45909 Unspecified asthma, uncomplicated: Secondary | ICD-10-CM | POA: Diagnosis present

## 2023-08-04 DIAGNOSIS — Z87440 Personal history of urinary (tract) infections: Secondary | ICD-10-CM | POA: Diagnosis not present

## 2023-08-04 DIAGNOSIS — K449 Diaphragmatic hernia without obstruction or gangrene: Secondary | ICD-10-CM | POA: Diagnosis present

## 2023-08-04 DIAGNOSIS — Z66 Do not resuscitate: Secondary | ICD-10-CM | POA: Diagnosis present

## 2023-08-04 DIAGNOSIS — E876 Hypokalemia: Secondary | ICD-10-CM | POA: Diagnosis present

## 2023-08-04 DIAGNOSIS — K439 Ventral hernia without obstruction or gangrene: Secondary | ICD-10-CM | POA: Diagnosis present

## 2023-08-04 DIAGNOSIS — K580 Irritable bowel syndrome with diarrhea: Secondary | ICD-10-CM | POA: Diagnosis present

## 2023-08-04 DIAGNOSIS — E785 Hyperlipidemia, unspecified: Secondary | ICD-10-CM | POA: Diagnosis present

## 2023-08-04 LAB — CBC
HCT: 34.3 % — ABNORMAL LOW (ref 39.0–52.0)
Hemoglobin: 11.3 g/dL — ABNORMAL LOW (ref 13.0–17.0)
MCH: 30.5 pg (ref 26.0–34.0)
MCHC: 32.9 g/dL (ref 30.0–36.0)
MCV: 92.5 fL (ref 80.0–100.0)
Platelets: 358 10*3/uL (ref 150–400)
RBC: 3.71 MIL/uL — ABNORMAL LOW (ref 4.22–5.81)
RDW: 13.2 % (ref 11.5–15.5)
WBC: 7.5 10*3/uL (ref 4.0–10.5)
nRBC: 0 % (ref 0.0–0.2)

## 2023-08-04 LAB — COMPREHENSIVE METABOLIC PANEL WITH GFR
ALT: 12 U/L (ref 0–44)
AST: 16 U/L (ref 15–41)
Albumin: 2.6 g/dL — ABNORMAL LOW (ref 3.5–5.0)
Alkaline Phosphatase: 55 U/L (ref 38–126)
Anion gap: 8 (ref 5–15)
BUN: 12 mg/dL (ref 8–23)
CO2: 22 mmol/L (ref 22–32)
Calcium: 8 mg/dL — ABNORMAL LOW (ref 8.9–10.3)
Chloride: 107 mmol/L (ref 98–111)
Creatinine, Ser: 0.59 mg/dL — ABNORMAL LOW (ref 0.61–1.24)
GFR, Estimated: >60 mL/min (ref >60)
Glucose, Bld: 87 mg/dL (ref 70–99)
Potassium: 3.3 mmol/L — ABNORMAL LOW (ref 3.5–5.1)
Sodium: 137 mmol/L (ref 135–145)
Total Bilirubin: 0.8 mg/dL (ref 0.0–1.2)
Total Protein: 5.9 g/dL — ABNORMAL LOW (ref 6.5–8.1)

## 2023-08-04 LAB — GASTROINTESTINAL PANEL BY PCR, STOOL (REPLACES STOOL CULTURE)

## 2023-08-04 LAB — PHOSPHORUS: Phosphorus: 3.6 mg/dL (ref 2.5–4.6)

## 2023-08-04 LAB — URINE CULTURE: Culture: NO GROWTH

## 2023-08-04 LAB — MAGNESIUM: Magnesium: 2.1 mg/dL (ref 1.7–2.4)

## 2023-08-04 MED ORDER — SODIUM CHLORIDE 0.9 % IV SOLN: 1.0000 g | INTRAVENOUS | Status: DC

## 2023-08-04 MED ORDER — SODIUM CHLORIDE (PF) 0.9 % IJ SOLN
INTRAMUSCULAR | Status: AC
  Filled 2023-08-04: qty 50

## 2023-08-04 MED ORDER — IOHEXOL 9 MG/ML PO SOLN
ORAL | Status: AC
  Filled 2023-08-04: qty 1000

## 2023-08-04 MED ORDER — IOHEXOL 9 MG/ML PO SOLN
500.0000 mL | ORAL | Status: AC
  Administered 2023-08-04 (×2): 500 mL via ORAL

## 2023-08-04 MED ORDER — IOHEXOL 300 MG/ML  SOLN
100.0000 mL | Freq: Once | INTRAMUSCULAR | Status: AC | PRN
  Administered 2023-08-04: 100 mL via INTRAVENOUS

## 2023-08-04 MED ORDER — LOPERAMIDE HCL 2 MG PO CAPS
2.0000 mg | ORAL_CAPSULE | ORAL | Status: DC | PRN
  Administered 2023-08-04: 2 mg via ORAL
  Filled 2023-08-04: qty 1

## 2023-08-04 MED ORDER — SODIUM CHLORIDE 0.9 % IV SOLN: INTRAVENOUS | Status: DC

## 2023-08-04 MED ORDER — LOPERAMIDE HCL 2 MG PO CAPS
2.0000 mg | ORAL_CAPSULE | Freq: Three times a day (TID) | ORAL | Status: DC | PRN
  Administered 2023-08-04: 2 mg via ORAL
  Filled 2023-08-04: qty 1

## 2023-08-04 NOTE — Progress Notes (Signed)
   08/04/23 1000  TOC Brief Assessment  Insurance and Status Reviewed  Patient has primary care physician Yes  Home environment has been reviewed apartment  Prior level of function: modified independent  Prior/Current Home Services No current home services  Social Drivers of Health Review SDOH reviewed no interventions necessary  Readmission risk has been reviewed Yes  Transition of care needs transition of care needs identified, TOC will continue to follow    [place], MSW, LCSW 08/04/2023 10:01 AM

## 2023-08-04 NOTE — Consult Note (Signed)
 [facility] Consult  Referring Provider: [facility] Primary Care Physician:  Benedetta Bradley, MD Primary Gastroenterologist: Dr. Dellis Fermo  Reason for Consultation: Severe diarrhea  HPI: Alex Oconnor is a 89 y.o. male underwent laparoscopic redo repair of toupee fundoplication on 07/15/2023, postop course complicated by urinary retention requiring Foley placement with bloody urine and was discharged home on 07/14/2023 with indwelling Foley catheter. Patient developed some loose stool since discharge and had symptoms of urinary retention post Foley catheter removal, went to [facility] walk-in clinic on 07/30/2023 with fever, chills, weakness, not feeling well, found to have elevated WBCs 18.2, was discharged on levofloxacin  750 mg once a day for 5 days and phenazopyridine 200 mg 1 tablet 3 times a day for 2 days for UTI. He went back to [facility] walk-in clinic with severe abdominal pain, diarrhea and was advised to go to ER with suspicions as he developed pyelonephritis and he was not responding to outpatient antibiotic therapy. On 08/01/2023 when he was seen in ER he was found to have normal WBC, urine culture did not show any growth, has positive nitrite but no bacteria and was discharged home. He came back to the ER on 08/03/2023 where he was found to have small leukocytes with no nitrite and rare bacteria.  Urine culture is currently in process. Through all of this, patient states that his diarrhea has worsened, prior to presentation he would have loose liquid bowel movement almost every 45 minutes and felt drained. He denies noticing any blood in stool or black stools.  He has had extensive GI workup in the past including EGDs, barium swallows, gastric emptying scan, hydrogen breath test, colonoscopy.   Past Medical History:  Diagnosis Date   Arthritis    Asthma    Carotid artery occlusion    Cataract of both eyes    Complication of anesthesia    pts wife states BP drops; also  had bout with pulse postop    Coronary artery disease    stent placed 1999   Dysrhythmia    Gastroparesis    GERD (gastroesophageal reflux disease)    Hard of hearing    Headache(784.0)    saw in ED 08/2006   History of hiatal hernia    Hyperlipemia    Hypertension    Irritable bowel syndrome    Numbness    left arm pts wife states MD aware and no issues identified   Rotator cuff disorder    left    Sleep apnea    10/2001; does not use CPAP   TIA (transient ischemic attack)    11/2011   Urinary hesitancy    Wears glasses     Past Surgical History:  Procedure Laterality Date   abd ultrasound      03/2007-xray of abd;01/2008-ultrasound of abd;01/2009-ultrasound of abd   BACK SURGERY     MRI lower back 09/2010;back injected 10/2010;11/2010 spinal lumbar fusion L4-5   CARDIAC CATHETERIZATION     May 1999   CARDIAC CATHETERIZATION N/A 08/23/2014   Procedure: Left Heart Cath and Coronary Angiography;  Surgeon: Arty Binning, MD;  Location: [facility] INVASIVE CV LAB;  Service: Cardiovascular;  Laterality: N/A;   CARDIOVASCULAR STRESS TEST     10/1999;09/2002   CAROTID ENDARTERECTOMY     right side 1997   COLONOSCOPY     colonscopy     02/2007   CORONARY STENT PLACEMENT     eyelid surgery     removal of ingrown eyelashes   GREEN  LIGHT LASER TURP (TRANSURETHRAL RESECTION OF PROSTATE N/A 06/19/2015   Procedure: GREEN LIGHT LASER TURP (TRANSURETHRAL RESECTION OF PROSTATE;  Surgeon: Homero Luster, MD;  Location: [facility];  Service: Urology;  Laterality: N/A;   HERNIA REPAIR     2001; inguinal - right    HIATAL HERNIA REPAIR N/A 12/08/2013   Procedure: LAPAROSCOPIC REPAIR OF HIATAL HERNIA;  Surgeon: Azucena Bollard, MD;  Location: [facility];  Service: General;  Laterality: N/A;   injection of knees     06/2012 both knees injected;10/2013 both knees injected    INSERTION OF MESH N/A 12/08/2013   Procedure: INSERTION OF MESH;  Surgeon: Azucena Bollard, MD;  Location: [facility];  Service: General;   Laterality: N/A;   KNEE ARTHROSCOPY     right knee 06/2002;12/2005-left knee   right hip injected     08/2013   TONSILLECTOMY      Prior to Admission medications   Medication Sig Start Date End Date Taking? Authorizing Provider  albuterol  (VENTOLIN  HFA) 108 (90 Base) MCG/ACT inhaler Inhale 2 puffs into the lungs every 6 (six) hours as needed for wheezing or shortness of breath. 11/04/20  Yes Leath-Warren, Belen Bowers, NP  aspirin  81 MG tablet Take 81 mg by mouth daily.   Yes [provider]  budesonide -formoterol (SYMBICORT) 160-4.5 MCG/ACT inhaler Inhale 2 puffs into the lungs in the morning and at bedtime. 05/11/20  Yes [provider]  cephALEXin (KEFLEX) 500 MG capsule Take 1 capsule (500 mg total) by mouth 4 (four) times daily. 08/01/23  Yes Lind Repine, MD  cholecalciferol  (VITAMIN D) 1000 UNITS tablet Take 1,000 Units by mouth daily.   Yes [provider]  Coenzyme Q10 100 MG capsule Take 100 mg by mouth daily.   Yes [provider]  escitalopram (LEXAPRO) 10 MG tablet Take 1 tablet by mouth daily. 06/15/20  Yes [provider]  esomeprazole (NEXIUM) 40 MG capsule Take 40 mg by mouth daily at 12 noon.   Yes [provider]  fluticasone  (FLONASE ) 50 MCG/ACT nasal spray Place 1 spray into both nostrils daily as needed for allergies or rhinitis.   Yes [provider]  ondansetron  (ZOFRAN -ODT) 4 MG disintegrating tablet Take 4 mg by mouth every 8 (eight) hours as needed for nausea. 07/18/23  Yes [provider]  phenazopyridine (PYRIDIUM) 200 MG tablet Take 200 mg by mouth 3 (three) times daily with meals. 07/30/23  Yes [provider]  Probiotic Product (PROBIOTIC PO) Take 1 tablet by mouth daily.   Yes [provider]  pyridostigmine (MESTINON) 60 MG tablet Take 60 mg by mouth 3 (three) times daily. 02/26/23  Yes [provider]  saw palmetto  500 MG capsule Take 500 mg by mouth every morning.    Yes  [provider]  simvastatin  (ZOCOR ) 20 MG tablet Take 20 mg by mouth every morning.    Yes [provider]  telmisartan (MICARDIS) 40 MG tablet Take 40 mg by mouth daily.   Yes [provider]  levofloxacin  (LEVAQUIN ) 750 MG tablet Take 750 mg by mouth daily. Patient not taking: Reported on 08/03/2023    [provider]  metoCLOPramide  (REGLAN ) 10 MG tablet Take 1 tablet (10 mg total) by mouth every 8 (eight) hours as needed for up to 5 days for nausea or vomiting. 01/24/23 01/29/23  Rolinda Climes, DO    Current Facility-Administered Medications  Medication Dose Route Frequency Provider Last Rate Last Admin   acetaminophen  (TYLENOL ) tablet 650  mg  650 mg Oral Q6H PRN Doutova, Anastassia, MD       Or   acetaminophen  (TYLENOL ) suppository 650 mg  650 mg Rectal Q6H PRN Doutova, Anastassia, MD       fluticasone  furoate-vilanterol (BREO ELLIPTA) 200-25 MCG/ACT 1 puff  1 puff Inhalation Daily Doutova, Anastassia, MD   1 puff at 08/04/23 0851   HYDROcodone -acetaminophen  (NORCO/VICODIN) 5-325 MG per tablet 1-2 tablet  1-2 tablet Oral Q4H PRN Doutova, Anastassia, MD       ondansetron  (ZOFRAN ) tablet 4 mg  4 mg Oral Q6H PRN Doutova, Anastassia, MD       Or   ondansetron  (ZOFRAN ) injection 4 mg  4 mg Intravenous Q6H PRN Doutova, Anastassia, MD       pyridostigmine (MESTINON) tablet 60 mg  60 mg Oral TID Doutova, Anastassia, MD   60 mg at 08/04/23 0827    Allergies as of 08/03/2023 - Review Complete 08/03/2023  Allergen Reaction Noted   Pantoprazole  Rash 06/16/2016   Prednisone Other (See Comments) 08/03/2023   Penicillins Hives 12/06/2011   Plavix  [clopidogrel  bisulfate] Other (See Comments) 11/17/2013   Ciprofloxacin  Rash 04/30/2020   Sulfa antibiotics Itching and Rash 04/30/2020    Family History  Problem Relation Age of Onset   Congestive Heart Failure Brother     Social History   Socioeconomic History   Marital status: Married    Spouse name: Not  on file   Number of children: 2   Years of education: Not on file   Highest education level: Not on file  Occupational History   Occupation: retired - Theatre manager company  Tobacco Use   Smoking status: Former    Current packs/day: 0.00    Average packs/day: 2.0 packs/day for 20.0 years (40.0 ttl pk-yrs)    Types: Cigarettes    Start date: 11/24/1960    Quit date: 11/24/1980    Years since quitting: 42.7   Smokeless tobacco: Never  Substance and Sexual Activity   Alcohol  use: Yes    Comment: past alcohol  abuse quit 30 years ago    Drug use: No   Sexual activity: Not on file  Other Topics Concern   Not on file  Social History Narrative   Not on file   Social Drivers of Health   Financial Resource Strain: Low Risk  (07/16/2023)   Received [facility]   Overall Financial Resource Strain (CARDIA)    Difficulty of Paying Living Expenses: Not hard at all  Food Insecurity: No Food Insecurity (08/03/2023)   Hunger Vital Sign    Worried About Running Out of Food in the Last Year: Never true    Ran Out of Food in the Last Year: Never true  Transportation Needs: No Transportation Needs (08/03/2023)   PRAPARE - Administrator, Civil Service (Medical): No    Lack of Transportation (Non-Medical): No  Physical Activity: Not on file  Stress: Not on file  Social Connections: Socially Integrated (08/03/2023)   Social Connection and Isolation Panel [NHANES]    Frequency of Communication with Friends and Family: Three times a week    Frequency of Social Gatherings with Friends and Family: More than three times a week    Attends Religious Services: More than 4 times per year    Active Member of [employer] or Organizations: Yes    Attends Banker Meetings: 1 to 4 times per year    Marital Status: Married  Catering manager Violence: Not At Risk (  08/03/2023)   Humiliation, Afraid, Rape, and Kick questionnaire    Fear of Current or Ex-Partner: No    Emotionally  Abused: No    Physically Abused: No    Sexually Abused: No    Review of Systems: As per HPI  Physical Exam: Vital signs in last 24 hours: Temp:  [97.8 F (36.6 C)-98.7 F (37.1 C)] 97.8 F (36.6 C) (06/10 0934) Pulse Rate:  [54-85] 56 (06/10 0934) Resp:  [16-20] 16 (06/10 0934) BP: (142-172)/(83-96) 144/89 (06/10 0934) SpO2:  [94 %-99 %] 99 % (06/10 0934) Weight:  [67.3 kg] 67.3 kg (06/09 1556) Last BM Date : 08/03/23  General:   Alert,  Well-developed, well-nourished, pleasant and cooperative in NAD Head:  Normocephalic and atraumatic. Eyes:  Sclera clear, no icterus.   Conjunctiva pink. Ears:  Normal auditory acuity. Nose:  No deformity, discharge,  or lesions. Mouth:  No deformity or lesions.  Oropharynx pink & moist. Neck:  Supple; no masses or thyromegaly. Lungs:  Clear throughout to auscultation.   No wheezes, crackles, or rhonchi. No acute distress. Heart:  Regular rate and rhythm; no murmurs, clicks, rubs,  or gallops. Extremities:  Without clubbing or edema. Neurologic:  Alert and  oriented x4;  grossly normal neurologically. Skin:  Intact without significant lesions or rashes. Psych:  Alert and cooperative. Normal mood and affect. Abdomen:  Soft, nontender and nondistended. No masses, hepatosplenomegaly or hernias noted. Normal bowel sounds, without guarding, and without rebound.         Lab Results: Recent Labs    08/01/23 1218 08/03/23 1645 08/04/23 0523  WBC 8.9 9.8 7.5  HGB 12.3* 14.0 11.3*  HCT 37.4* 42.8 34.3*  PLT 369 470* 358   BMET Recent Labs    08/01/23 1218 08/03/23 1645 08/04/23 0523  NA 137 140 137  K 3.8 3.6 3.3*  CL 107 105 107  CO2 23 22 22   GLUCOSE 101* 99 87  BUN 18 18 12   CREATININE 1.26* 1.04 0.59*  CALCIUM 8.3* 8.9 8.0*   LFT Recent Labs    08/04/23 0523  PROT 5.9*  ALBUMIN 2.6*  AST 16  ALT 12  ALKPHOS 55  BILITOT 0.8   PT/INR No results for input(s): "LABPROT", "INR" in the last 72  hours.  Studies/Results: No results found.  Impression: Acute diarrhea Stool study negative for C. difficile while GI pathogen panel pending  Hypokalemia, potassium 3.3 9 malnutrition, albumin 2.6 with low total protein of 5.9  History of urinary retention requiring Foley catheter placement, subsequent removal, UTI, recent antibiotic use  Plan: CAT scan of abdomen and pelvis with contrast has been ordered by primary team.  Okay to start on regular diet from GI standpoint. Will wait for results for GI pathogen panel. I will put him on Imodium to be taken as needed. I discussed about colestipol which can be used as an alternative as an antidiarrheal if needed. He remains afebrile, has benign abdominal exam, is hemodynamically stable, and has no signs of dehydration. Continue current management.   LOS: 0 days   Genell Ken, MD  08/04/2023, 10:19 AM

## 2023-08-04 NOTE — Plan of Care (Signed)
  Problem: Education: Goal: Knowledge of General Education information will improve Description: Including pain rating scale, medication(s)/side effects and non-pharmacologic comfort measures Outcome: Progressing   Problem: Clinical Measurements: Goal: Diagnostic test results will improve Outcome: Progressing   Problem: Coping: Goal: Level of anxiety will decrease Outcome: Progressing   Problem: Pain Managment: Goal: General experience of comfort will improve and/or be controlled Outcome: Progressing   Problem: Skin Integrity: Goal: Risk for impaired skin integrity will decrease Outcome: Progressing

## 2023-08-04 NOTE — Progress Notes (Signed)
 [facility] Progress Note  Patient: Alex Oconnor     WGN:[id number]  DOA: 08/03/2023   PCP: Benedetta Bradley, MD       Brief hospital course: This is an 89 year old male with hypertension, TIA, coronary artery disease, a hiatal hernia status post repair, urinary retention, urinary tract infection seen at [facility] urgent care for fever and urinary urgency and frequency on 6/5 and started on levofloxacin .  He came to the ED on 6/7 for feeling bloated, having belching and having diarrhea and was switched to cephalexin.He continued to have diarrhea which increased to 10-20 bowel movements days and presented again to hospital on 6/9.    Subjective:  Has had another large liquid BM.   Assessment and Plan: Principal Problem:   Diarrhea, severe and watery - started after Levofloxacin  - C. difficile neg - GI pathogen panel neg - stopped Levaquin  and Cephalexin - Imodium ordered - cont NS at 75 cc/hr until diarrhea resolves - Regular diet started - will need to follow for 24 hrs on regular diet to ensure diarrhea is controlled  Active Problems:  Hypokalemia - due to diarrhea - Mg normal - cont to replace and recheck tomorrow   Enterobacter cloacae UTI with fevers and urinary urgency - treated with Levaquin  and Cephalexin - urine culture negative on 6/7 and symptoms resolved - no further antibiotics to be given         Code Status: Limited: Do not attempt resuscitation (DNR) -DNR-LIMITED -Do Not Intubate/DNI  Total time on patient care: 35 DVT prophylaxis:  SCDs Start: 08/03/23 2140     Objective:   Vitals:   08/03/23 2100 08/03/23 2130 08/04/23 0122 08/04/23 0444  BP: (!) 156/83 (!) 172/93 (!) 169/83 (!) 142/96  Pulse: (!) 59 (!) 55 (!) 58 (!) 54  Resp: 18 20 18 20   Temp: 98.7 F (37.1 C) 98.3 F (36.8 C) 98 F (36.7 C) 97.9 F (36.6 C)  TempSrc: Oral     SpO2: 96% 99% 94% 97%  Weight:      Height:       Filed Weights   08/03/23 1556  Weight: 67.3  kg   Exam: General exam: Appears comfortable  HEENT: oral mucosa moist Respiratory system: Clear to auscultation.  Cardiovascular system: S1 & S2 heard  Gastrointestinal system: Abdomen soft, tender in upper abdomen, nondistended. Normal bowel sounds   Extremities: No cyanosis, clubbing or edema Psychiatry:  Mood & affect appropriate.      CBC: Recent Labs  Lab 08/01/23 1218 08/03/23 1645 08/04/23 0523  WBC 8.9 9.8 7.5  NEUTROABS 6.1  --   --   HGB 12.3* 14.0 11.3*  HCT 37.4* 42.8 34.3*  MCV 92.6 93.9 92.5  PLT 369 470* 358   Basic Metabolic Panel: Recent Labs  Lab 08/01/23 1218 08/03/23 1645 08/03/23 2044 08/04/23 0523  NA 137 140  --  137  K 3.8 3.6  --  3.3*  CL 107 105  --  107  CO2 23 22  --  22  GLUCOSE 101* 99  --  87  BUN 18 18  --  12  CREATININE 1.26* 1.04  --  0.59*  CALCIUM 8.3* 8.9  --  8.0*  MG  --   --  1.9 2.1  PHOS  --   --  4.0 3.6     Scheduled Meds:  fluticasone  furoate-vilanterol  1 puff Inhalation Daily   iohexol   500 mL Oral Q1H   pyridostigmine  60 mg  Oral TID    Imaging and lab data personally reviewed   Author: Mathias Bogacki  08/04/2023 8:50 AM  To contact [facility]>   Check the care team in [facility] and look for the attending/consulting [facility] provider listed  Log into [email or web address] and use [place]'s universal password   Go to> "[facility]"  and find provider  If you still have difficulty reaching the provider, please page the [facility] (Director on Call) for the Hospitalists listed on amion

## 2023-08-05 DIAGNOSIS — R197 Diarrhea, unspecified: Secondary | ICD-10-CM | POA: Diagnosis not present

## 2023-08-05 MED ORDER — LOPERAMIDE HCL 2 MG PO CAPS
2.0000 mg | ORAL_CAPSULE | ORAL | 0 refills | Status: AC | PRN
Start: 2023-08-05 — End: ?

## 2023-08-05 NOTE — Plan of Care (Signed)

## 2023-08-05 NOTE — Progress Notes (Signed)
 Subjective: States he feels great.  He slept well overnight.  He had dinner without any issues last night.  Is waiting for his breakfast.  Has not had any diarrhea.  Denies abdominal pain.  Objective: Vital signs in last 24 hours: Temp:  [97.8 F (36.6 C)-98.3 F (36.8 C)] 98.1 F (36.7 C) (06/11 0530) Pulse Rate:  [53-58] 53 (06/11 0530) Resp:  [15-18] 18 (06/11 0530) BP: (139-155)/(74-90) 142/74 (06/11 0530) SpO2:  [97 %-99 %] 98 % (06/11 0530) Weight change:  Last BM Date : 08/03/23  PE: Not in distress GENERAL: Nonicteric, no pallor  ABDOMEN: Soft, nondistended, nontender, normoactive bowel sounds EXTREMITIES: No deformity  Lab Results: Results for orders placed or performed during the hospital encounter of 08/03/23 (from the past 48 hours)  Comprehensive metabolic panel     Status: None   Collection Time: 08/03/23  4:45 PM  Result Value Ref Range   Sodium 140 135 - 145 mmol/L   Potassium 3.6 3.5 - 5.1 mmol/L   Chloride 105 98 - 111 mmol/L   CO2 22 22 - 32 mmol/L   Glucose, Bld 99 70 - 99 mg/dL    Comment: Glucose reference range applies only to samples taken after fasting for at least 8 hours.   BUN 18 8 - 23 mg/dL   Creatinine, Ser 0.45 0.61 - 1.24 mg/dL   Calcium 8.9 8.9 - 40.9 mg/dL   Total Protein 7.9 6.5 - 8.1 g/dL   Albumin 3.6 3.5 - 5.0 g/dL   AST 21 15 - 41 U/L   ALT 18 0 - 44 U/L   Alkaline Phosphatase 76 38 - 126 U/L   Total Bilirubin 0.4 0.0 - 1.2 mg/dL   GFR, Estimated >81 >19 mL/min    Comment: (NOTE) Calculated using the CKD-EPI Creatinine Equation (2021)    Anion gap 13 5 - 15    Comment: Performed at [facility], [address], [place], [place] [address]  CBC     Status: Abnormal   Collection Time: 08/03/23  4:45 PM  Result Value Ref Range   WBC 9.8 4.0 - 10.5 K/uL   RBC 4.56 4.22 - 5.81 MIL/uL   Hemoglobin 14.0 13.0 - 17.0 g/dL   HCT 95.6 21.3 - 08.6 %   MCV 93.9 80.0 - 100.0 fL   MCH 30.7 26.0 - 34.0 pg   MCHC 32.7  30.0 - 36.0 g/dL   RDW 57.8 46.9 - 62.9 %   Platelets 470 (H) 150 - 400 K/uL   nRBC 0.0 0.0 - 0.2 %    Comment: Performed at [facility], [address] [place], [place] [address]  Urinalysis, Routine w reflex microscopic -Urine, Clean Catch     Status: Abnormal   Collection Time: 08/03/23  4:46 PM  Result Value Ref Range   Color, Urine YELLOW YELLOW   APPearance HAZY (A) CLEAR   Specific Gravity, Urine 1.010 1.005 - 1.030   pH 5.0 5.0 - 8.0   Glucose, UA NEGATIVE NEGATIVE mg/dL   Hgb urine dipstick SMALL (A) NEGATIVE   Bilirubin Urine NEGATIVE NEGATIVE   Ketones, ur NEGATIVE NEGATIVE mg/dL   Protein, ur NEGATIVE NEGATIVE mg/dL   Nitrite NEGATIVE NEGATIVE   Leukocytes,Ua SMALL (A) NEGATIVE   RBC / HPF 0-5 0 - 5 RBC/hpf   WBC, UA 11-20 0 - 5 WBC/hpf   Bacteria, UA RARE (A) NONE SEEN   Squamous Epithelial / HPF 0-5 0 - 5 /HPF   Mucus  PRESENT     Comment: Performed at [facility], [address], [place], [place] [address]  Urine Culture     Status: None   Collection Time: 08/03/23  6:12 PM   Specimen: Urine, Clean Catch  Result Value Ref Range   Specimen Description      URINE, CLEAN CATCH Performed at [facility], [address], [place], [place] [address]    Special Requests      NONE Performed at [facility], [address] [place], [place] [address]    Culture      NO GROWTH Performed at [facility], [address], [place], [place] [address]    Report Status 08/04/2023 FINAL   C Difficile Quick Screen w PCR reflex     Status: None   Collection Time: 08/03/23  6:59 PM   Specimen: STOOL  Result Value Ref Range   C Diff antigen NEGATIVE NEGATIVE   C Diff toxin NEGATIVE NEGATIVE   C Diff interpretation No C. difficile detected.     Comment: Performed at [facility], [address] [place], [place] [address]  Gastrointestinal Panel by PCR , Stool     Status:  None   Collection Time: 08/03/23  6:59 PM   Specimen: STOOL  Result Value Ref Range   Campylobacter species NOT DETECTED NOT DETECTED   Plesimonas shigelloides NOT DETECTED NOT DETECTED   Salmonella species NOT DETECTED NOT DETECTED   Yersinia enterocolitica NOT DETECTED NOT DETECTED   Vibrio species NOT DETECTED NOT DETECTED   Vibrio cholerae NOT DETECTED NOT DETECTED   Enteroaggregative E coli (EAEC) NOT DETECTED NOT DETECTED   Enteropathogenic E coli (EPEC) NOT DETECTED NOT DETECTED   Enterotoxigenic E coli (ETEC) NOT DETECTED NOT DETECTED   Shiga like toxin producing E coli (STEC) NOT DETECTED NOT DETECTED   Shigella/Enteroinvasive E coli (EIEC) NOT DETECTED NOT DETECTED   Cryptosporidium NOT DETECTED NOT DETECTED   Cyclospora cayetanensis NOT DETECTED NOT DETECTED   Entamoeba histolytica NOT DETECTED NOT DETECTED   Giardia lamblia NOT DETECTED NOT DETECTED   Adenovirus F40/41 NOT DETECTED NOT DETECTED   Astrovirus NOT DETECTED NOT DETECTED   Norovirus GI/GII NOT DETECTED NOT DETECTED   Rotavirus A NOT DETECTED NOT DETECTED   Sapovirus (I, II, IV, and V) NOT DETECTED NOT DETECTED    Comment: Performed at [facility], [address] [place], [place] [address]  CK     Status: None   Collection Time: 08/03/23  8:44 PM  Result Value Ref Range   Total CK 86 49 - 397 U/L    Comment: Performed at [facility], [address] [place], [place] [address]  Magnesium     Status: None   Collection Time: 08/03/23  8:44 PM  Result Value Ref Range   Magnesium 1.9 1.7 - 2.4 mg/dL    Comment: Performed at [facility], [address], [place], [place] [address]  Phosphorus     Status: None   Collection Time: 08/03/23  8:44 PM  Result Value Ref Range   Phosphorus 4.0 2.5 - 4.6 mg/dL    Comment: Performed at [facility], [address] [place], [place] [address]  TSH     Status: None   Collection Time: 08/03/23  8:45 PM   Result Value Ref Range   TSH 1.755 0.350 - 4.500 uIU/mL    Comment: Performed by a 3rd Generation  assay with a functional sensitivity of <=0.01 uIU/mL. Performed at [facility], [address], [place], [place] [address]   Magnesium     Status: None   Collection Time: 08/04/23  5:23 AM  Result Value Ref Range   Magnesium 2.1 1.7 - 2.4 mg/dL    Comment: Performed at [facility], [address], [place], [place] [address]  Phosphorus     Status: None   Collection Time: 08/04/23  5:23 AM  Result Value Ref Range   Phosphorus 3.6 2.5 - 4.6 mg/dL    Comment: Performed at [facility], [address] [place], [place] [address]  Comprehensive metabolic panel     Status: Abnormal   Collection Time: 08/04/23  5:23 AM  Result Value Ref Range   Sodium 137 135 - 145 mmol/L   Potassium 3.3 (L) 3.5 - 5.1 mmol/L   Chloride 107 98 - 111 mmol/L   CO2 22 22 - 32 mmol/L   Glucose, Bld 87 70 - 99 mg/dL    Comment: Glucose reference range applies only to samples taken after fasting for at least 8 hours.   BUN 12 8 - 23 mg/dL   Creatinine, Ser 1.60 (L) 0.61 - 1.24 mg/dL   Calcium 8.0 (L) 8.9 - 10.3 mg/dL   Total Protein 5.9 (L) 6.5 - 8.1 g/dL   Albumin 2.6 (L) 3.5 - 5.0 g/dL   AST 16 15 - 41 U/L   ALT 12 0 - 44 U/L   Alkaline Phosphatase 55 38 - 126 U/L   Total Bilirubin 0.8 0.0 - 1.2 mg/dL   GFR, Estimated >73 >71 mL/min    Comment: (NOTE) Calculated using the CKD-EPI Creatinine Equation (2021)    Anion gap 8 5 - 15    Comment: Performed at [facility], [address], [place], [place] [address]  CBC     Status: Abnormal   Collection Time: 08/04/23  5:23 AM  Result Value Ref Range   WBC 7.5 4.0 - 10.5 K/uL   RBC 3.71 (L) 4.22 - 5.81 MIL/uL   Hemoglobin 11.3 (L) 13.0 - 17.0 g/dL   HCT 48.5 (L) 46.2 - 70.3 %   MCV 92.5 80.0 - 100.0 fL   MCH 30.5 26.0 - 34.0 pg   MCHC 32.9 30.0 - 36.0 g/dL   RDW 50.0 93.8 - 18.2  %   Platelets 358 150 - 400 K/uL   nRBC 0.0 0.0 - 0.2 %    Comment: Performed at [facility], [address] [place], [place] [address]    Studies/Results: CT ABDOMEN PELVIS W CONTRAST Result Date: 08/04/2023 CLINICAL DATA:  Hernia suspected. EXAM: CT ABDOMEN AND PELVIS WITH CONTRAST TECHNIQUE: Multidetector CT imaging of the abdomen and pelvis was performed using the standard protocol following bolus administration of intravenous contrast. RADIATION DOSE REDUCTION: This exam was performed according to the departmental dose-optimization program which includes automated exposure control, adjustment of the mA and/or kV according to patient size and/or use of iterative reconstruction technique. CONTRAST:  OMNIPAQUE  IOHEXOL  300 MG/ML  SOLN COMPARISON:  CT abdomen pelvis dated 08/01/2023. FINDINGS: Lower chest: The visualized lung bases are clear. There is coronary vascular calcification. No intra-abdominal free air or free fluid. Hepatobiliary: Small liver cysts. No biliary dilatation. The gallbladder is unremarkable. Pancreas: Unremarkable. No pancreatic ductal dilatation or surrounding inflammatory changes. Spleen: Normal in size without focal abnormality. Adrenals/Urinary Tract: The adrenal glands unremarkable there is no hydronephrosis on  either side. There is symmetric enhancement and excretion of contrast by both kidneys. Bilateral renal cysts. The visualized ureters and urinary bladder appear unremarkable. Stomach/Bowel: There is sigmoid diverticulosis and scattered colonic diverticula. There is postsurgical changes of Nissen fundoplication. A 2.6 x 1.5 cm low attenuating area just above the Nissen wrap (10/2) was present on the prior CT and may be postsurgical but concerning for a small fluid collection. There is a mild edema along the Nissen wrap. There is no bowel obstruction. The appendix is normal. Vascular/Lymphatic: Advanced aortoiliac atherosclerotic disease. The IVC is  unremarkable. No portal gas. There is no adenopathy. Reproductive: The prostate gland is mildly enlarged measuring approximately 5 cm in transverse axial diameter. The seminal vessels are symmetric. Other: Small fat containing supraumbilical hernia. Musculoskeletal: Osteopenia with degenerative changes spine. Lower lumbar posterior fusion. No acute osseous pathology. IMPRESSION: 1. Postsurgical changes of Nissen fundoplication with probable small fluid collection adjacent to the Nissen wrap. 2. Colonic diverticulosis. No bowel obstruction. Normal appendix. 3. Small fat containing supraumbilical hernia. 4.  Aortic Atherosclerosis (ICD10-I70.0). Electronically Signed   By: Angus Bark M.D.   On: 08/04/2023 12:39    Medications: I have reviewed the patient's current medications.  Assessment: Acute diarrhea, has resolved ?  Related to antibiotic use Stool studies and GI pathogen panel negative  Plan: Tolerating regular diet with complete resolution of diarrhea. Okay to DC home from GI standpoint. Okay to take Imodium as needed as an outpatient.  Genell Ken, MD 08/05/2023, 8:45 AM

## 2023-08-05 NOTE — Discharge Summary (Signed)
 Physician Discharge Summary   Patient: Alex Oconnor MRN: [id number] DOB: [birth year]  Admit date:     08/03/2023  Discharge date: 08/05/23  Discharge Physician: Jodeane Mulligan   PCP: Benedetta Bradley, MD   Recommendations at discharge:    Pt to be discharged home.   If you experience worsening fever, chills, chest pain, shortness of breath, or other concerning symptoms, please call your PCP or go to the emergency department immediately.  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 [facility] Course:  This is an 89 year old male with hypertension, TIA, coronary artery disease, a hiatal hernia status post repair, urinary retention, urinary tract infection seen at [facility] urgent care for fever and urinary urgency and frequency on 6/5 and started on levofloxacin .  He came to the ED on 6/7 for feeling bloated, having belching and having diarrhea and was switched to cephalexin.He continued to have diarrhea which increased to 10-20 bowel movements days and presented again to hospital on 6/9.   Assessment and Plan:  Intractable diarrhea - C. difficile negative.  GI pathogen profile negative.  Discontinue antibiotic.  Imodium ordered.  Appears to be totally resolved at this time.  Evaluated by GI.  Hypokalemia - Resolved after replenishment.  Enterobacter UTI (POA) - Needed antibiotic therapy.    Consultants: Gastroenterology Procedures performed: None Disposition: Home Diet recommendation:  Discharge Diet Orders (From admission, onward)     Start     Ordered   08/05/23 0000  Diet - low sodium heart healthy        08/05/23 1052           Cardiac  diet  DISCHARGE MEDICATION: Allergies as of 08/05/2023       Reactions   Pantoprazole  Rash   Prednisone Other (See Comments)   Severe insomnia   Penicillins Hives   Has patient had a PCN reaction causing immediate rash, facial/tongue/throat swelling, SOB or lightheadedness with hypotension: Has patient had a PCN reaction causing severe rash involving mucus membranes or skin necrosis:  Has patient had a PCN reaction that required hospitalization  Has patient had a PCN reaction occurring within the last 10 years: No If all of the above answers are NO, then may proceed with Cephalosporin use.   Plavix  [clopidogrel  Bisulfate] Other (See Comments)   Developed a taste disorder.   Ciprofloxacin  Rash   Sulfa Antibiotics Itching, Rash        Medication List     TAKE these medications    albuterol  108 (90 Base) MCG/ACT inhaler Commonly known as: VENTOLIN  HFA Inhale 2 puffs into the lungs every 6 (six) hours as needed for wheezing or shortness of breath.   aspirin  81 MG tablet Take 81 mg by mouth daily.   budesonide -formoterol 160-4.5 MCG/ACT inhaler Commonly known as: SYMBICORT Inhale 2 puffs into the lungs in the morning and at bedtime.   cephALEXin 500 MG capsule Commonly known as: KEFLEX Take 1 capsule (500 mg total) by mouth 4 (four) times daily.   cholecalciferol  1000 units tablet Commonly known as: VITAMIN D Take 1,000 Units by mouth daily.   Coenzyme Q10 100 MG capsule Take  100 mg by mouth daily.   escitalopram 10 MG tablet Commonly known as: LEXAPRO Take 1 tablet by mouth daily.   esomeprazole 40 MG capsule Commonly known as: NEXIUM Take 40 mg by mouth daily at 12 noon.   fluticasone  50 MCG/ACT nasal spray Commonly known as: FLONASE  Place 1 spray into both nostrils daily as needed for allergies or rhinitis.   levofloxacin  750 MG tablet Commonly known as: LEVAQUIN  Take 750 mg by mouth daily.   loperamide 2 MG capsule Commonly known as: IMODIUM Take 1  capsule (2 mg total) by mouth every 4 (four) hours as needed for diarrhea or loose stools.   metoCLOPramide  10 MG tablet Commonly known as: REGLAN  Take 1 tablet (10 mg total) by mouth every 8 (eight) hours as needed for up to 5 days for nausea or vomiting.   ondansetron  4 MG disintegrating tablet Commonly known as: ZOFRAN -ODT Take 4 mg by mouth every 8 (eight) hours as needed for nausea.   phenazopyridine 200 MG tablet Commonly known as: PYRIDIUM Take 200 mg by mouth 3 (three) times daily with meals.   PROBIOTIC PO Take 1 tablet by mouth daily.   pyridostigmine 60 MG tablet Commonly known as: MESTINON Take 60 mg by mouth 3 (three) times daily.   saw palmetto  500 MG capsule Take 500 mg by mouth every morning.   simvastatin  20 MG tablet Commonly known as: ZOCOR  Take 20 mg by mouth every morning.   telmisartan 40 MG tablet Commonly known as: MICARDIS Take 40 mg by mouth daily.         Discharge Exam: Filed Weights   08/03/23 1556  Weight: 67.3 kg    GENERAL:  Alert, pleasant, no acute distress  HEENT:  EOMI CARDIOVASCULAR:  RRR, no murmurs appreciated RESPIRATORY:  Clear to auscultation, no wheezing, rales, or rhonchi GASTROINTESTINAL:  Soft, nontender, nondistended EXTREMITIES:  No LE edema bilaterally NEURO:  No new focal deficits appreciated SKIN:  No rashes noted PSYCH:  Appropriate mood and affect     Condition at discharge: improving  The results of significant diagnostics from this hospitalization (including imaging, microbiology, ancillary and laboratory) are listed below for reference.   Imaging Studies: CT ABDOMEN PELVIS W CONTRAST Result Date: 08/04/2023 CLINICAL DATA:  Hernia suspected. EXAM: CT ABDOMEN AND PELVIS WITH CONTRAST TECHNIQUE: Multidetector CT imaging of the abdomen and pelvis was performed using the standard protocol following bolus administration of intravenous contrast. RADIATION DOSE REDUCTION: This exam was performed according to  the departmental dose-optimization program which includes automated exposure control, adjustment of the mA and/or kV according to patient size and/or use of iterative reconstruction technique. CONTRAST:  OMNIPAQUE  IOHEXOL  300 MG/ML  SOLN COMPARISON:  CT abdomen pelvis dated 08/01/2023. FINDINGS: Lower chest: The visualized lung bases are clear. There is coronary vascular calcification. No intra-abdominal free air or free fluid. Hepatobiliary: Small liver cysts. No biliary dilatation. The gallbladder is unremarkable. Pancreas: Unremarkable. No pancreatic ductal dilatation or surrounding inflammatory changes. Spleen: Normal in size without focal abnormality. Adrenals/Urinary Tract: The adrenal glands unremarkable there is no hydronephrosis on either side. There is symmetric enhancement and excretion of contrast by both kidneys. Bilateral renal cysts. The visualized ureters and urinary bladder appear unremarkable. Stomach/Bowel: There is sigmoid diverticulosis and scattered colonic diverticula. There is postsurgical changes of Nissen fundoplication. A 2.6 x 1.5 cm low attenuating area just above the Nissen wrap (10/2) was present on the prior CT and may be postsurgical but concerning for a small fluid collection. There is  a mild edema along the Nissen wrap. There is no bowel obstruction. The appendix is normal. Vascular/Lymphatic: Advanced aortoiliac atherosclerotic disease. The IVC is unremarkable. No portal gas. There is no adenopathy. Reproductive: The prostate gland is mildly enlarged measuring approximately 5 cm in transverse axial diameter. The seminal vessels are symmetric. Other: Small fat containing supraumbilical hernia. Musculoskeletal: Osteopenia with degenerative changes spine. Lower lumbar posterior fusion. No acute osseous pathology. IMPRESSION: 1. Postsurgical changes of Nissen fundoplication with probable small fluid collection adjacent to the Nissen wrap. 2. Colonic diverticulosis. No bowel  obstruction. Normal appendix. 3. Small fat containing supraumbilical hernia. 4.  Aortic Atherosclerosis (ICD10-I70.0). Electronically Signed   By: Angus Bark M.D.   On: 08/04/2023 12:39   CT Renal Stone Study Result Date: 08/01/2023 CLINICAL DATA:  Flank pain.  Concern for kidney stone. EXAM: CT ABDOMEN AND PELVIS WITHOUT CONTRAST TECHNIQUE: Multidetector CT imaging of the abdomen and pelvis was performed following the standard protocol without IV contrast. RADIATION DOSE REDUCTION: This exam was performed according to the departmental dose-optimization program which includes automated exposure control, adjustment of the mA and/or kV according to patient size and/or use of iterative reconstruction technique. COMPARISON:  CT abdomen pelvis dated 01/24/2023. FINDINGS: Evaluation of this exam is limited in the absence of intravenous contrast. Lower chest: The visualized lung bases are clear. There is coronary vascular calcification. No intra-abdominal free air or free fluid. Hepatobiliary: Small liver cysts. No biliary dilatation. Minimal amount of sludge or tiny stones in the gallbladder. No pericholecystic fluid. Pancreas: Unremarkable. No pancreatic ductal dilatation or surrounding inflammatory changes. Spleen: Normal in size without focal abnormality. Adrenals/Urinary Tract: The adrenal glands are unremarkable. There is no hydronephrosis or nephrolithiasis on either side. Bilateral renal cysts. The visualized ureters appear unremarkable. The urinary bladder is mildly distended. Air within the urinary bladder may have been introduced by recent instrumentation. Correlation with urinalysis recommended to exclude cystitis. Stomach/Bowel: Ill-defined soft tissue in the region of the GE junction at the level of the diaphragm may be related to postsurgical changes. There is apparent soft tissue thickening of the proximal stomach adjacent to the GE junction which is not evaluated on this CT but possibly related to  prior hernia repair surgery. Correlation with surgical history and further evaluation with upper GI study recommended. There is diffuse colonic diverticulosis with severe diverticulosis of the descending and sigmoid colon. There is no bowel obstruction or active inflammation. The appendix is normal. Vascular/Lymphatic: Advanced aortoiliac atherosclerotic disease. The IVC is unremarkable. No portal venous gas. There is no adenopathy. Reproductive: Enlarged prostate gland measuring 5.5 cm in transverse axial diameter. The seminal vesicles are symmetric. Other: Small fat containing ventral hernia to the right of the midline. There is mild infiltration of the herniated fat similar to prior CT. Correlation with clinical exam and point tenderness recommended to exclude strangulation/incarceration. No fluid collection. Musculoskeletal: Osteopenia with degenerative changes of the spine. Lower lumbar posterior fusion. No acute osseous pathology. IMPRESSION: 1. No hydronephrosis or nephrolithiasis. 2. Air within the urinary bladder may have been introduced by recent instrumentation. Correlation with urinalysis recommended to exclude cystitis. 3. Colonic diverticulosis. No bowel obstruction. Normal appendix. 4. Small fat containing ventral hernia to the right of the midline. Correlation with clinical exam and point tenderness recommended to exclude strangulation/incarceration. 5. Soft tissue thickening of the GE junction may be postsurgical. Further evaluation with upper GI study recommended. 6.  Aortic Atherosclerosis (ICD10-I70.0). Electronically Signed   By: Angus Bark M.D.   On: 08/01/2023 11:17  Microbiology: Results for orders placed or performed during the hospital encounter of 08/03/23  Urine Culture     Status: None   Collection Time: 08/03/23  6:12 PM   Specimen: Urine, Clean Catch  Result Value Ref Range Status   Specimen Description   Final    URINE, CLEAN CATCH Performed at [facility], [address], [place], [place] [address]    Special Requests   Final    NONE Performed at [facility], [address], [place], [place] [address]    Culture   Final    NO GROWTH Performed at [facility], [address] [place], [place] [address]    Report Status 08/04/2023 FINAL  Final  C Difficile Quick Screen w PCR reflex     Status: None   Collection Time: 08/03/23  6:59 PM   Specimen: STOOL  Result Value Ref Range Status   C Diff antigen NEGATIVE NEGATIVE Final   C Diff toxin NEGATIVE NEGATIVE Final   C Diff interpretation No C. difficile detected.  Final    Comment: Performed at [facility], [address], [place], [place] [address]  Gastrointestinal Panel by PCR , Stool     Status: None   Collection Time: 08/03/23  6:59 PM   Specimen: STOOL  Result Value Ref Range Status   Campylobacter species NOT DETECTED NOT DETECTED Final   Plesimonas shigelloides NOT DETECTED NOT DETECTED Final   Salmonella species NOT DETECTED NOT DETECTED Final   Yersinia enterocolitica NOT DETECTED NOT DETECTED Final   Vibrio species NOT DETECTED NOT DETECTED Final   Vibrio cholerae NOT DETECTED NOT DETECTED Final   Enteroaggregative E coli (EAEC) NOT DETECTED NOT DETECTED Final   Enteropathogenic E coli (EPEC) NOT DETECTED NOT DETECTED Final   Enterotoxigenic E coli (ETEC) NOT DETECTED NOT DETECTED Final   Shiga like toxin producing E coli (STEC) NOT DETECTED NOT DETECTED Final   Shigella/Enteroinvasive E coli (EIEC) NOT DETECTED NOT DETECTED Final   Cryptosporidium NOT DETECTED NOT DETECTED Final   Cyclospora cayetanensis NOT DETECTED NOT DETECTED Final   Entamoeba histolytica NOT DETECTED NOT DETECTED Final   Giardia lamblia NOT DETECTED NOT DETECTED Final   Adenovirus F40/41 NOT DETECTED NOT DETECTED Final   Astrovirus NOT DETECTED NOT DETECTED Final   Norovirus GI/GII NOT DETECTED NOT DETECTED Final   Rotavirus A NOT DETECTED NOT  DETECTED Final   Sapovirus (I, II, IV, and V) NOT DETECTED NOT DETECTED Final    Comment: Performed at [facility], [address] [place], [place] [address]    Labs: CBC: Recent Labs  Lab 08/01/23 1218 08/03/23 1645 08/04/23 0523  WBC 8.9 9.8 7.5  NEUTROABS 6.1  --   --   HGB 12.3* 14.0 11.3*  HCT 37.4* 42.8 34.3*  MCV 92.6 93.9 92.5  PLT 369 470* 358   Basic Metabolic Panel: Recent Labs  Lab 08/01/23 1218 08/03/23 1645 08/03/23 2044 08/04/23 0523  NA 137 140  --  137  K 3.8 3.6  --  3.3*  CL 107 105  --  107  CO2 23 22  --  22  GLUCOSE 101* 99  --  87  BUN 18 18  --  12  CREATININE 1.26* 1.04  --  0.59*  CALCIUM 8.3* 8.9  --  8.0*  MG  --   --  1.9 2.1  PHOS  --   --  4.0 3.6   Liver  Function Tests: Recent Labs  Lab 08/03/23 1645 08/04/23 0523  AST 21 16  ALT 18 12  ALKPHOS 76 55  BILITOT 0.4 0.8  PROT 7.9 5.9*  ALBUMIN 3.6 2.6*   CBG: No results for input(s): GLUCAP in the last 168 hours.  Discharge time spent: 25 minutes.  Length of inpatient stay: 1 days  Signed: Jodeane Mulligan, DO [facility] 08/05/2023

## 2023-08-10 DIAGNOSIS — K449 Diaphragmatic hernia without obstruction or gangrene: Secondary | ICD-10-CM | POA: Diagnosis not present

## 2023-08-10 DIAGNOSIS — R7303 Prediabetes: Secondary | ICD-10-CM | POA: Diagnosis not present

## 2023-08-10 DIAGNOSIS — K589 Irritable bowel syndrome without diarrhea: Secondary | ICD-10-CM | POA: Diagnosis not present

## 2023-08-10 DIAGNOSIS — Z79899 Other long term (current) drug therapy: Secondary | ICD-10-CM | POA: Diagnosis not present

## 2023-08-10 DIAGNOSIS — I10 Essential (primary) hypertension: Secondary | ICD-10-CM | POA: Diagnosis not present

## 2023-08-10 DIAGNOSIS — J302 Other seasonal allergic rhinitis: Secondary | ICD-10-CM | POA: Diagnosis not present

## 2023-08-10 DIAGNOSIS — N400 Enlarged prostate without lower urinary tract symptoms: Secondary | ICD-10-CM | POA: Diagnosis not present

## 2023-08-10 DIAGNOSIS — F439 Reaction to severe stress, unspecified: Secondary | ICD-10-CM | POA: Diagnosis not present

## 2023-08-10 DIAGNOSIS — K909 Intestinal malabsorption, unspecified: Secondary | ICD-10-CM | POA: Diagnosis not present

## 2023-08-10 DIAGNOSIS — K219 Gastro-esophageal reflux disease without esophagitis: Secondary | ICD-10-CM | POA: Diagnosis not present

## 2023-08-10 DIAGNOSIS — R2681 Unsteadiness on feet: Secondary | ICD-10-CM | POA: Diagnosis not present

## 2023-08-10 DIAGNOSIS — R29898 Other symptoms and signs involving the musculoskeletal system: Secondary | ICD-10-CM | POA: Diagnosis not present

## 2023-08-12 DIAGNOSIS — I10 Essential (primary) hypertension: Secondary | ICD-10-CM | POA: Diagnosis not present

## 2023-08-12 DIAGNOSIS — I2510 Atherosclerotic heart disease of native coronary artery without angina pectoris: Secondary | ICD-10-CM | POA: Diagnosis not present

## 2023-08-17 ENCOUNTER — Emergency Department (HOSPITAL_BASED_OUTPATIENT_CLINIC_OR_DEPARTMENT_OTHER)

## 2023-08-17 ENCOUNTER — Emergency Department (HOSPITAL_BASED_OUTPATIENT_CLINIC_OR_DEPARTMENT_OTHER)
Admission: EM | Admit: 2023-08-17 | Discharge: 2023-08-17 | Disposition: A | Discharge status: Home / Self Care | Attending: Emergency Medicine | Admitting: Emergency Medicine

## 2023-08-17 ENCOUNTER — Other Ambulatory Visit: Payer: Self-pay

## 2023-08-17 ENCOUNTER — Encounter (HOSPITAL_BASED_OUTPATIENT_CLINIC_OR_DEPARTMENT_OTHER): Payer: Self-pay | Admitting: *Deleted

## 2023-08-17 DIAGNOSIS — R531 Weakness: Secondary | ICD-10-CM | POA: Diagnosis present

## 2023-08-17 DIAGNOSIS — R1310 Dysphagia, unspecified: Secondary | ICD-10-CM | POA: Diagnosis not present

## 2023-08-17 DIAGNOSIS — Z7982 Long term (current) use of aspirin: Secondary | ICD-10-CM | POA: Diagnosis not present

## 2023-08-17 DIAGNOSIS — E860 Dehydration: Secondary | ICD-10-CM | POA: Diagnosis not present

## 2023-08-17 DIAGNOSIS — R110 Nausea: Secondary | ICD-10-CM | POA: Diagnosis not present

## 2023-08-17 DIAGNOSIS — R140 Abdominal distension (gaseous): Secondary | ICD-10-CM | POA: Diagnosis not present

## 2023-08-17 DIAGNOSIS — R109 Unspecified abdominal pain: Secondary | ICD-10-CM | POA: Diagnosis not present

## 2023-08-17 LAB — COMPREHENSIVE METABOLIC PANEL WITH GFR
ALT: 14 U/L (ref 0–44)
AST: 21 U/L (ref 15–41)
Albumin: 4 g/dL (ref 3.5–5.0)
Alkaline Phosphatase: 96 U/L (ref 38–126)
Anion gap: 12 (ref 5–15)
BUN: 24 mg/dL — ABNORMAL HIGH (ref 8–23)
CO2: 24 mmol/L (ref 22–32)
Calcium: 9.1 mg/dL (ref 8.9–10.3)
Chloride: 102 mmol/L (ref 98–111)
Creatinine, Ser: 1.13 mg/dL (ref 0.61–1.24)
GFR, Estimated: >60 mL/min (ref >60)
Glucose, Bld: 103 mg/dL — ABNORMAL HIGH (ref 70–99)
Potassium: 4 mmol/L (ref 3.5–5.1)
Sodium: 138 mmol/L (ref 135–145)
Total Bilirubin: 0.5 mg/dL (ref 0.0–1.2)
Total Protein: 7.2 g/dL (ref 6.5–8.1)

## 2023-08-17 LAB — CBC WITH DIFFERENTIAL/PLATELET
Abs Immature Granulocytes: 0.07 10*3/uL (ref 0.00–0.07)
Basophils Absolute: 0 10*3/uL (ref 0.0–0.1)
Basophils Relative: 0 %
Eosinophils Absolute: 0.3 10*3/uL (ref 0.0–0.5)
Eosinophils Relative: 3 %
HCT: 41.5 % (ref 39.0–52.0)
Hemoglobin: 13.8 g/dL (ref 13.0–17.0)
Immature Granulocytes: 1 %
Lymphocytes Relative: 25 %
Lymphs Abs: 2.5 10*3/uL (ref 0.7–4.0)
MCH: 30 pg (ref 26.0–34.0)
MCHC: 33.3 g/dL (ref 30.0–36.0)
MCV: 90.2 fL (ref 80.0–100.0)
Monocytes Absolute: 0.7 10*3/uL (ref 0.1–1.0)
Monocytes Relative: 7 %
Neutro Abs: 6.4 10*3/uL (ref 1.7–7.7)
Neutrophils Relative %: 64 %
Platelets: 296 10*3/uL (ref 150–400)
RBC: 4.6 MIL/uL (ref 4.22–5.81)
RDW: 13.5 % (ref 11.5–15.5)
WBC: 10 10*3/uL (ref 4.0–10.5)
nRBC: 0 % (ref 0.0–0.2)

## 2023-08-17 LAB — URINALYSIS, ROUTINE W REFLEX MICROSCOPIC
Bilirubin Urine: NEGATIVE
Glucose, UA: NEGATIVE mg/dL
Hgb urine dipstick: NEGATIVE
Ketones, ur: NEGATIVE mg/dL
Leukocytes,Ua: NEGATIVE
Nitrite: NEGATIVE
Protein, ur: NEGATIVE mg/dL
Specific Gravity, Urine: 1.01 (ref 1.005–1.030)
pH: 6.5 (ref 5.0–8.0)

## 2023-08-17 LAB — LIPASE, BLOOD: Lipase: 22 U/L (ref 11–51)

## 2023-08-17 MED ORDER — LACTATED RINGERS IV BOLUS
1000.0000 mL | Freq: Once | INTRAVENOUS | Status: AC
  Administered 2023-08-17: 1000 mL via INTRAVENOUS

## 2023-08-17 MED ORDER — IOHEXOL 300 MG/ML  SOLN
100.0000 mL | Freq: Once | INTRAMUSCULAR | Status: AC | PRN
  Administered 2023-08-17: 100 mL via INTRAVENOUS

## 2023-08-17 NOTE — ED Notes (Signed)
 Discharge instructions reviewed with patient. Patient verbalizes understanding, no further questions at this time. Medications and follow up information provided. No acute distress noted at time of departure.

## 2023-08-17 NOTE — ED Triage Notes (Signed)
 Also states he has had trouble swallowing . Endorses nausea, fatigue, bloating, cramping . .A lot of general concerns  .

## 2023-08-17 NOTE — ED Provider Notes (Signed)
 [facility]  Provider Note  CSN: [id number] Arrival date & time: 08/17/23 0534  History Chief Complaint  Patient presents with   Weakness    S/P hiatal hernia    Alex Oconnor is a 89 y.o. male with history of failed [employer] fundoplication s/p redo on 5/21 at [facility] reports he has not been doing well post op. He had ED visit and admission for diarrhea 6/9 with overall reassuring workup and resolution of diarrhea. He has continued to have poor PO intake, reports some dysphagia for solids and liquids, but able to eventually get things down. Nausea and abdominal bloating without vomiting. No further diarrhea. Denies melena or hematochezia. No urinary symptoms. No fever. He feels generally weak and difficulty getting around.    Home Medications Prior to Admission medications   Medication Sig Start Date End Date Taking? Authorizing Provider  albuterol  (VENTOLIN  HFA) 108 (90 Base) MCG/ACT inhaler Inhale 2 puffs into the lungs every 6 (six) hours as needed for wheezing or shortness of breath. 11/04/20   Leath-Warren, Etta PARAS, NP  aspirin  81 MG tablet Take 81 mg by mouth daily.    [provider]  budesonide -formoterol (SYMBICORT) 160-4.5 MCG/ACT inhaler Inhale 2 puffs into the lungs in the morning and at bedtime. 05/11/20   [provider]  cephALEXin  (KEFLEX ) 500 MG capsule Take 1 capsule (500 mg total) by mouth 4 (four) times daily. 08/01/23   Dasie Faden, MD  cholecalciferol  (VITAMIN D) 1000 UNITS tablet Take 1,000 Units by mouth daily.    [provider]  Coenzyme Q10 100 MG capsule Take 100 mg by mouth daily.    [provider]  escitalopram (LEXAPRO) 10 MG tablet Take 1 tablet by mouth daily. 06/15/20   [provider]  esomeprazole (NEXIUM) 40 MG capsule Take 40 mg by mouth daily at 12 noon.    [provider]  fluticasone  (FLONASE ) 50 MCG/ACT nasal spray Place 1 spray into both nostrils  daily as needed for allergies or rhinitis.    [provider]  levofloxacin  (LEVAQUIN ) 750 MG tablet Take 750 mg by mouth daily. Patient not taking: Reported on 08/03/2023    [provider]  loperamide  (IMODIUM ) 2 MG capsule Take 1 capsule (2 mg total) by mouth every 4 (four) hours as needed for diarrhea or loose stools. 08/05/23   Arlon Carliss ORN, DO  metoCLOPramide  (REGLAN ) 10 MG tablet Take 1 tablet (10 mg total) by mouth every 8 (eight) hours as needed for up to 5 days for nausea or vomiting. 01/24/23 01/29/23  Neysa Caron PARAS, DO  ondansetron  (ZOFRAN -ODT) 4 MG disintegrating tablet Take 4 mg by mouth every 8 (eight) hours as needed for nausea. 07/18/23   [provider]  phenazopyridine (PYRIDIUM) 200 MG tablet Take 200 mg by mouth 3 (three) times daily with meals. 07/30/23   [provider]  Probiotic Product (PROBIOTIC PO) Take 1 tablet by mouth daily.    [provider]  pyridostigmine  (MESTINON ) 60 MG tablet Take 60 mg by mouth 3 (three) times daily. 02/26/23   [provider]  saw palmetto  500 MG capsule Take 500 mg by mouth every morning.     [provider]  simvastatin  (ZOCOR ) 20 MG tablet Take 20 mg by mouth every morning.     [provider]  telmisartan (MICARDIS) 40 MG tablet Take 40 mg by mouth daily.    [provider]     Allergies  Pantoprazole , Prednisone, Levofloxacin , Penicillins, Plavix  [clopidogrel  bisulfate], Ciprofloxacin , and Sulfa antibiotics   Review of Systems   Review of Systems Please see HPI for pertinent positives and negatives  Physical Exam BP (!) 136/103 (BP Location: Right Arm)   Pulse 69   Temp 98.1 F (36.7 C) (Oral)   Resp 16   Ht 5' 8 (1.727 m)   Wt 64.4 kg   SpO2 93%   BMI 21.59 kg/m   Physical Exam Vitals and nursing note reviewed.  Constitutional:      Appearance: Normal appearance.  HENT:     Head: Normocephalic and atraumatic.     Nose: Nose normal.      Mouth/Throat:     Mouth: Mucous membranes are moist.   Eyes:     Extraocular Movements: Extraocular movements intact.     Conjunctiva/sclera: Conjunctivae normal.    Cardiovascular:     Rate and Rhythm: Normal rate.  Pulmonary:     Effort: Pulmonary effort is normal.     Breath sounds: Normal breath sounds.  Abdominal:     General: Abdomen is flat. Bowel sounds are normal.     Palpations: Abdomen is soft.     Tenderness: There is no abdominal tenderness. There is no guarding.   Musculoskeletal:        General: No swelling. Normal range of motion.     Cervical back: Neck supple.   Skin:    General: Skin is warm and dry.   Neurological:     General: No focal deficit present.     Mental Status: He is alert.   Psychiatric:        Mood and Affect: Mood normal.     ED Results / Procedures / Treatments   EKG EKG Interpretation Date/Time:  Monday August 17 2023 05:44:14 EDT Ventricular Rate:  68 PR Interval:  236 QRS Duration:  87 QT Interval:  433 QTC Calculation: 461 R Axis:   -64  Text Interpretation: Sinus rhythm Prolonged PR interval Inferior infarct, old Baseline wander No significant change since last tracing Confirmed by Roselyn Dunnings [id number]) on 08/17/2023 5:48:16 AM  Procedures Procedures  Medications Ordered in the ED Medications  lactated ringers  bolus 1,000 mL (1,000 mLs Intravenous New Bag/Given 08/17/23 9386)  iohexol  (OMNIPAQUE ) 300 MG/ML solution 100 mL (100 mLs Intravenous Contrast Given 08/17/23 0705)    Initial Impression and Plan  Patient here with nonspecific dysphagia, poor PO intake and general weakness worsening since surgery on 5/21. He has been to see PCP and scheduled to see GI in 2-3 months. Will check labs, repeat CT to ensure no obvious complications from surgery and give IVF for comfort.   ED Course   Clinical Course as of 08/17/23 0708  Mon Aug 17, 2023  0627 CBC is normal.  [CS]  0636 CMP and lipase are unremarkable.  [CS]   [id number] Care of the patient signed out at shift change.  [CS]    Clinical Course User Index [CS] Roselyn Dunnings NOVAK, MD     MDM Rules/Calculators/A&P Medical Decision Making Problems Addressed: Dysphagia, unspecified type: acute illness or injury  Amount and/or Complexity of Data Reviewed Labs: ordered. Decision-making details documented in ED Course. Radiology: ordered. ECG/medicine tests: ordered and independent interpretation performed. Decision-making details documented in ED Course.  Risk Prescription drug management.     Final Clinical Impression(s) / ED Diagnoses Final diagnoses:  Dysphagia, unspecified type    Rx / DC Orders ED Discharge Orders     None  Roselyn Carlin NOVAK, MD 08/17/23 [phone]

## 2023-08-17 NOTE — Discharge Instructions (Signed)
 Your history, exam, workup today did not reveal acute surgical problem.  The CT scan appeared similar to prior and we feel you are safe for discharge home to follow-up with your GI team and your surgeon.  Please rest and stay hydrated.  If any symptoms change or worsen acutely, please return to the nearest emergency department.

## 2023-08-17 NOTE — ED Triage Notes (Signed)
 Patient BIB by wife with multiple concerns however here today because of Increased weakness. Pt - Sts I feel washed out.  S/p Hiatal Hernia sx. 6/21 .

## 2023-08-17 NOTE — ED Provider Notes (Signed)
 Care assumed from Dr. Roselyn.  At time of transfer of care, patient awaiting for results of CT scan to look for concerning etiology of the patient's dysphagia, nausea, and bloating given his recent abdominal surgery.  If workup reassuring, dissipate discharge after rehydration.  8:08 AM CT scan returned without any acute changes from recent imaging.  Specifically no clear evidence of a surgical problem or postoperative complication worsening.  Patient and family agree with finishing fluids, gentle p.o. challenge, and then plan for discharge to follow-up with outpatient GI and surgical team.  Anticipate discharge after rehydration finishes.  8:53 AM Patient has finished fluids and passed p.o. challenge.  Will discharge for outpatient follow-up.  Patient and family agree and patient was discharged in good condition.  Clinical Impression: 1. Dysphagia, unspecified type   2. Dehydration     Disposition: Discharge  Condition: Good  I have discussed the results, Dx and Tx plan with the pt(& family if present). He/she/they expressed understanding and agree(s) with the plan. Discharge instructions discussed at great length. Strict return precautions discussed and pt &/or family have verbalized understanding of the instructions. No further questions at time of discharge.    New Prescriptions   No medications on file    Follow Up: Charlott Dorn LABOR, MD [address] [place] [address] [phone]     your GI team and Surgeon     [facility] Emergency Department at [facility] [address] [place]  [address] [phone]         Nasiya Pascual, Lonni PARAS, MD 08/17/23 [phone]

## 2023-08-24 DIAGNOSIS — Z09 Encounter for follow-up examination after completed treatment for conditions other than malignant neoplasm: Secondary | ICD-10-CM | POA: Diagnosis not present

## 2023-08-24 DIAGNOSIS — Z9889 Other specified postprocedural states: Secondary | ICD-10-CM | POA: Diagnosis not present

## 2023-08-24 DIAGNOSIS — Z8719 Personal history of other diseases of the digestive system: Secondary | ICD-10-CM | POA: Diagnosis not present

## 2023-08-31 DIAGNOSIS — R262 Difficulty in walking, not elsewhere classified: Secondary | ICD-10-CM | POA: Diagnosis not present

## 2023-08-31 DIAGNOSIS — M6281 Muscle weakness (generalized): Secondary | ICD-10-CM | POA: Diagnosis not present

## 2023-09-04 DIAGNOSIS — R262 Difficulty in walking, not elsewhere classified: Secondary | ICD-10-CM | POA: Diagnosis not present

## 2023-09-04 DIAGNOSIS — M6281 Muscle weakness (generalized): Secondary | ICD-10-CM | POA: Diagnosis not present

## 2023-09-08 DIAGNOSIS — R262 Difficulty in walking, not elsewhere classified: Secondary | ICD-10-CM | POA: Diagnosis not present

## 2023-09-08 DIAGNOSIS — M6281 Muscle weakness (generalized): Secondary | ICD-10-CM | POA: Diagnosis not present

## 2023-09-09 DIAGNOSIS — R262 Difficulty in walking, not elsewhere classified: Secondary | ICD-10-CM | POA: Diagnosis not present

## 2023-09-09 DIAGNOSIS — M6281 Muscle weakness (generalized): Secondary | ICD-10-CM | POA: Diagnosis not present

## 2023-09-11 DIAGNOSIS — R262 Difficulty in walking, not elsewhere classified: Secondary | ICD-10-CM | POA: Diagnosis not present

## 2023-09-11 DIAGNOSIS — M6281 Muscle weakness (generalized): Secondary | ICD-10-CM | POA: Diagnosis not present

## 2023-09-11 DIAGNOSIS — I2510 Atherosclerotic heart disease of native coronary artery without angina pectoris: Secondary | ICD-10-CM | POA: Diagnosis not present

## 2023-09-11 DIAGNOSIS — I10 Essential (primary) hypertension: Secondary | ICD-10-CM | POA: Diagnosis not present

## 2023-09-14 DIAGNOSIS — M6281 Muscle weakness (generalized): Secondary | ICD-10-CM | POA: Diagnosis not present

## 2023-09-14 DIAGNOSIS — R262 Difficulty in walking, not elsewhere classified: Secondary | ICD-10-CM | POA: Diagnosis not present

## 2023-09-16 DIAGNOSIS — R262 Difficulty in walking, not elsewhere classified: Secondary | ICD-10-CM | POA: Diagnosis not present

## 2023-09-16 DIAGNOSIS — M6281 Muscle weakness (generalized): Secondary | ICD-10-CM | POA: Diagnosis not present

## 2023-09-18 DIAGNOSIS — M6281 Muscle weakness (generalized): Secondary | ICD-10-CM | POA: Diagnosis not present

## 2023-09-18 DIAGNOSIS — R262 Difficulty in walking, not elsewhere classified: Secondary | ICD-10-CM | POA: Diagnosis not present

## 2023-09-21 DIAGNOSIS — R262 Difficulty in walking, not elsewhere classified: Secondary | ICD-10-CM | POA: Diagnosis not present

## 2023-09-21 DIAGNOSIS — M6281 Muscle weakness (generalized): Secondary | ICD-10-CM | POA: Diagnosis not present

## 2023-09-23 DIAGNOSIS — M6281 Muscle weakness (generalized): Secondary | ICD-10-CM | POA: Diagnosis not present

## 2023-09-23 DIAGNOSIS — R262 Difficulty in walking, not elsewhere classified: Secondary | ICD-10-CM | POA: Diagnosis not present

## 2023-09-24 DIAGNOSIS — I10 Essential (primary) hypertension: Secondary | ICD-10-CM | POA: Diagnosis not present

## 2023-09-24 DIAGNOSIS — R262 Difficulty in walking, not elsewhere classified: Secondary | ICD-10-CM | POA: Diagnosis not present

## 2023-09-24 DIAGNOSIS — J4530 Mild persistent asthma, uncomplicated: Secondary | ICD-10-CM | POA: Diagnosis not present

## 2023-09-24 DIAGNOSIS — I2510 Atherosclerotic heart disease of native coronary artery without angina pectoris: Secondary | ICD-10-CM | POA: Diagnosis not present

## 2023-09-24 DIAGNOSIS — N400 Enlarged prostate without lower urinary tract symptoms: Secondary | ICD-10-CM | POA: Diagnosis not present

## 2023-09-24 DIAGNOSIS — M6281 Muscle weakness (generalized): Secondary | ICD-10-CM | POA: Diagnosis not present

## 2023-09-28 DIAGNOSIS — R262 Difficulty in walking, not elsewhere classified: Secondary | ICD-10-CM | POA: Diagnosis not present

## 2023-09-28 DIAGNOSIS — M6281 Muscle weakness (generalized): Secondary | ICD-10-CM | POA: Diagnosis not present

## 2023-09-30 DIAGNOSIS — M6281 Muscle weakness (generalized): Secondary | ICD-10-CM | POA: Diagnosis not present

## 2023-09-30 DIAGNOSIS — R262 Difficulty in walking, not elsewhere classified: Secondary | ICD-10-CM | POA: Diagnosis not present

## 2023-10-02 DIAGNOSIS — M6281 Muscle weakness (generalized): Secondary | ICD-10-CM | POA: Diagnosis not present

## 2023-10-02 DIAGNOSIS — R262 Difficulty in walking, not elsewhere classified: Secondary | ICD-10-CM | POA: Diagnosis not present

## 2023-10-05 DIAGNOSIS — R262 Difficulty in walking, not elsewhere classified: Secondary | ICD-10-CM | POA: Diagnosis not present

## 2023-10-05 DIAGNOSIS — M6281 Muscle weakness (generalized): Secondary | ICD-10-CM | POA: Diagnosis not present

## 2023-10-08 DIAGNOSIS — R262 Difficulty in walking, not elsewhere classified: Secondary | ICD-10-CM | POA: Diagnosis not present

## 2023-10-08 DIAGNOSIS — M6281 Muscle weakness (generalized): Secondary | ICD-10-CM | POA: Diagnosis not present

## 2023-10-11 DIAGNOSIS — I10 Essential (primary) hypertension: Secondary | ICD-10-CM | POA: Diagnosis not present

## 2023-10-11 DIAGNOSIS — I2510 Atherosclerotic heart disease of native coronary artery without angina pectoris: Secondary | ICD-10-CM | POA: Diagnosis not present

## 2023-10-12 DIAGNOSIS — R262 Difficulty in walking, not elsewhere classified: Secondary | ICD-10-CM | POA: Diagnosis not present

## 2023-10-12 DIAGNOSIS — M6281 Muscle weakness (generalized): Secondary | ICD-10-CM | POA: Diagnosis not present

## 2023-10-16 DIAGNOSIS — R262 Difficulty in walking, not elsewhere classified: Secondary | ICD-10-CM | POA: Diagnosis not present

## 2023-10-16 DIAGNOSIS — M6281 Muscle weakness (generalized): Secondary | ICD-10-CM | POA: Diagnosis not present

## 2023-10-22 DIAGNOSIS — R143 Flatulence: Secondary | ICD-10-CM | POA: Diagnosis not present

## 2023-10-22 DIAGNOSIS — K449 Diaphragmatic hernia without obstruction or gangrene: Secondary | ICD-10-CM | POA: Diagnosis not present

## 2023-10-25 DIAGNOSIS — I2510 Atherosclerotic heart disease of native coronary artery without angina pectoris: Secondary | ICD-10-CM | POA: Diagnosis not present

## 2023-10-25 DIAGNOSIS — I10 Essential (primary) hypertension: Secondary | ICD-10-CM | POA: Diagnosis not present

## 2023-10-25 DIAGNOSIS — N400 Enlarged prostate without lower urinary tract symptoms: Secondary | ICD-10-CM | POA: Diagnosis not present

## 2023-10-25 DIAGNOSIS — J4530 Mild persistent asthma, uncomplicated: Secondary | ICD-10-CM | POA: Diagnosis not present

## 2023-10-29 DIAGNOSIS — R140 Abdominal distension (gaseous): Secondary | ICD-10-CM | POA: Diagnosis not present

## 2023-11-10 DIAGNOSIS — I2510 Atherosclerotic heart disease of native coronary artery without angina pectoris: Secondary | ICD-10-CM | POA: Diagnosis not present

## 2023-11-10 DIAGNOSIS — I10 Essential (primary) hypertension: Secondary | ICD-10-CM | POA: Diagnosis not present

## 2023-11-20 DIAGNOSIS — R0789 Other chest pain: Secondary | ICD-10-CM | POA: Diagnosis not present

## 2023-11-20 DIAGNOSIS — R0602 Shortness of breath: Secondary | ICD-10-CM | POA: Diagnosis not present

## 2023-11-20 DIAGNOSIS — R5383 Other fatigue: Secondary | ICD-10-CM | POA: Diagnosis not present

## 2023-11-24 DIAGNOSIS — I10 Essential (primary) hypertension: Secondary | ICD-10-CM | POA: Diagnosis not present

## 2023-11-24 DIAGNOSIS — I2510 Atherosclerotic heart disease of native coronary artery without angina pectoris: Secondary | ICD-10-CM | POA: Diagnosis not present

## 2023-11-24 DIAGNOSIS — J4530 Mild persistent asthma, uncomplicated: Secondary | ICD-10-CM | POA: Diagnosis not present

## 2023-11-24 DIAGNOSIS — N400 Enlarged prostate without lower urinary tract symptoms: Secondary | ICD-10-CM | POA: Diagnosis not present

## 2023-11-30 ENCOUNTER — Other Ambulatory Visit: Payer: Self-pay

## 2023-11-30 ENCOUNTER — Emergency Department (HOSPITAL_BASED_OUTPATIENT_CLINIC_OR_DEPARTMENT_OTHER)

## 2023-11-30 ENCOUNTER — Emergency Department (HOSPITAL_BASED_OUTPATIENT_CLINIC_OR_DEPARTMENT_OTHER)
Admission: EM | Admit: 2023-11-30 | Discharge: 2023-11-30 | Disposition: A | Discharge status: Home / Self Care | Attending: Emergency Medicine | Admitting: Emergency Medicine

## 2023-11-30 ENCOUNTER — Encounter (HOSPITAL_BASED_OUTPATIENT_CLINIC_OR_DEPARTMENT_OTHER): Payer: Self-pay | Admitting: Emergency Medicine

## 2023-11-30 DIAGNOSIS — R059 Cough, unspecified: Secondary | ICD-10-CM | POA: Diagnosis present

## 2023-11-30 DIAGNOSIS — R079 Chest pain, unspecified: Secondary | ICD-10-CM

## 2023-11-30 DIAGNOSIS — I10 Essential (primary) hypertension: Secondary | ICD-10-CM | POA: Diagnosis not present

## 2023-11-30 DIAGNOSIS — N400 Enlarged prostate without lower urinary tract symptoms: Secondary | ICD-10-CM | POA: Diagnosis not present

## 2023-11-30 DIAGNOSIS — R0602 Shortness of breath: Secondary | ICD-10-CM | POA: Diagnosis not present

## 2023-11-30 DIAGNOSIS — I2510 Atherosclerotic heart disease of native coronary artery without angina pectoris: Secondary | ICD-10-CM | POA: Diagnosis not present

## 2023-11-30 DIAGNOSIS — R490 Dysphonia: Secondary | ICD-10-CM | POA: Diagnosis not present

## 2023-11-30 DIAGNOSIS — J45909 Unspecified asthma, uncomplicated: Secondary | ICD-10-CM | POA: Insufficient documentation

## 2023-11-30 DIAGNOSIS — R42 Dizziness and giddiness: Secondary | ICD-10-CM | POA: Diagnosis not present

## 2023-11-30 DIAGNOSIS — I77810 Thoracic aortic ectasia: Secondary | ICD-10-CM | POA: Diagnosis not present

## 2023-11-30 DIAGNOSIS — K5730 Diverticulosis of large intestine without perforation or abscess without bleeding: Secondary | ICD-10-CM | POA: Diagnosis not present

## 2023-11-30 DIAGNOSIS — R0789 Other chest pain: Secondary | ICD-10-CM | POA: Insufficient documentation

## 2023-11-30 DIAGNOSIS — I517 Cardiomegaly: Secondary | ICD-10-CM | POA: Diagnosis not present

## 2023-11-30 LAB — BASIC METABOLIC PANEL WITH GFR
Anion gap: 12 (ref 5–15)
BUN: 23 mg/dL (ref 8–23)
CO2: 26 mmol/L (ref 22–32)
Calcium: 9.6 mg/dL (ref 8.9–10.3)
Chloride: 101 mmol/L (ref 98–111)
Creatinine, Ser: 1.33 mg/dL — ABNORMAL HIGH (ref 0.61–1.24)
GFR, Estimated: 51 mL/min — ABNORMAL LOW (ref >60)
Glucose, Bld: 131 mg/dL — ABNORMAL HIGH (ref 70–99)
Potassium: 4 mmol/L (ref 3.5–5.1)
Sodium: 138 mmol/L (ref 135–145)

## 2023-11-30 LAB — RESP PANEL BY RT-PCR (RSV, FLU A&B, COVID)  RVPGX2
Influenza A by PCR: NEGATIVE
Influenza B by PCR: NEGATIVE
Resp Syncytial Virus by PCR: NEGATIVE
SARS Coronavirus 2 by RT PCR: NEGATIVE

## 2023-11-30 LAB — CBC
HCT: 43.4 % (ref 39.0–52.0)
Hemoglobin: 14.4 g/dL (ref 13.0–17.0)
MCH: 29.4 pg (ref 26.0–34.0)
MCHC: 33.2 g/dL (ref 30.0–36.0)
MCV: 88.8 fL (ref 80.0–100.0)
Platelets: 316 K/uL (ref 150–400)
RBC: 4.89 MIL/uL (ref 4.22–5.81)
RDW: 12 % (ref 11.5–15.5)
WBC: 9.8 K/uL (ref 4.0–10.5)
nRBC: 0 % (ref 0.0–0.2)

## 2023-11-30 LAB — HEPATIC FUNCTION PANEL
ALT: 11 U/L (ref 0–44)
AST: 24 U/L (ref 15–41)
Albumin: 3.9 g/dL (ref 3.5–5.0)
Alkaline Phosphatase: 103 U/L (ref 38–126)
Bilirubin, Direct: <0.1 mg/dL (ref 0.0–0.2)
Total Bilirubin: 0.3 mg/dL (ref 0.0–1.2)
Total Protein: 7.6 g/dL (ref 6.5–8.1)

## 2023-11-30 LAB — PRO BRAIN NATRIURETIC PEPTIDE: Pro Brain Natriuretic Peptide: 217 pg/mL (ref <300.0)

## 2023-11-30 LAB — TROPONIN T, HIGH SENSITIVITY
Troponin T High Sensitivity: 33 ng/L — ABNORMAL HIGH (ref 0–19)
Troponin T High Sensitivity: 32 ng/L — ABNORMAL HIGH (ref 0–19)

## 2023-11-30 LAB — LIPASE, BLOOD: Lipase: 18 U/L (ref 11–51)

## 2023-11-30 MED ORDER — IOHEXOL 350 MG/ML SOLN
100.0000 mL | Freq: Once | INTRAVENOUS | Status: AC | PRN
  Administered 2023-11-30: 100 mL via INTRAVENOUS

## 2023-11-30 MED ORDER — LACTATED RINGERS IV BOLUS
500.0000 mL | Freq: Once | INTRAVENOUS | Status: AC
  Administered 2023-11-30: 500 mL via INTRAVENOUS

## 2023-11-30 NOTE — ED Triage Notes (Signed)
 Pt c/o chest congestion/tightness, feeling hoarse x few weeks; spouse reports he hasn't felt good since last Nov.  Denies fever, denies cough though reports I spit up mucous constantly Reports chest hurts when I breathe.  Uses home MDI, no relief.

## 2023-11-30 NOTE — ED Notes (Signed)
 Called lab to add orders on to previously collected labs

## 2023-11-30 NOTE — ED Provider Notes (Signed)
  Physical Exam  BP (!) 148/92   Pulse 70   Temp 97.9 F (36.6 C) (Oral)   Resp 15   Ht 5' 8 (1.727 m)   Wt 66.2 kg   SpO2 94%   BMI 22.20 kg/m   Physical Exam  Procedures  Procedures  ED Course / MDM       89yo male with history of hypertension, CAD, hiatal hernia, presents with concern for worsening fatigue, hoarse voice, and chest burning/tightness.  Labs completed at this time include troponins which are mildly positive and stable on recheck. Mildly elevated Cr, no anemia or leukocytosis.   Received care of patient from Dr. Darra at this time plan to follow up on hepatic function panel, lipase, proBNP, COVID 19 which are pending.   Discussed care with patient and wife--discussed combination of both hoarse voice and chest pain could consider aortic dissection/enlarging thoracic aneurysm, worsening reflux/hiatal hernia, viral syndrome/allergies or other.   Will order CTA chest/abd/pelvis to evaluate for dissection.  CTA shows no evidence of dissection or aortic aneurysm, does show small hiatal hernia with thickening of the distal esophagus.  Lipase, hepatic panel, and proBNP are within normal limits.  Discussed with, nation of hoarse voice, burning chest pain, thickening of the esophagus on CT he may be having esophagitis and reflux as etiology of his symptoms, however given his mildly elevated troponins, fatigue, risk factors for heart disease will also refer to cardiology.  Discussed that if he develops more of an exertional type of pain, worsening pain or other concerns he should return to the emergency department.      Dreama Longs, MD 11/30/23 [phone]

## 2023-11-30 NOTE — ED Provider Notes (Signed)
 Emergency Department Provider Note   I have reviewed the triage vital signs and the nursing notes.   HISTORY  Chief Complaint Cough and Chest Pain   HPI Alex Oconnor is a 89 y.o. male with past history reviewed below including CAD, hypertension, hyperlipidemia, GERD and hiatal hernia presents emergency department with significant decrease in his energy, hoarseness to his voice, tightness in the chest.  He feels sensation of mucus running frequently and has shortness of breath worse with exertion.  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  When his symptoms returned he has been much less active.  He denies fevers, vomiting, diarrhea.  He has been compliant with his home medications including his medications for reflux and gastritis.  He has not appreciated significant swelling in his legs.  No productive cough.  He did go to [facility] walk-in clinic and states that labs there were reassuring and has a PCP appointment in 2 weeks but with worsening symptoms presents to the ED for evaluation.   Past Medical History:  Diagnosis Date   Arthritis    Asthma    Carotid artery occlusion    Cataract of both eyes    Complication of anesthesia    pts wife states BP drops; also had bout with pulse postop    Coronary artery disease    stent placed 1999   Dysrhythmia    Gastroparesis    GERD (gastroesophageal reflux disease)    Hard of hearing    Headache(784.0)    saw in ED 08/2006   History of hiatal hernia    Hyperlipemia    Hypertension    Irritable bowel syndrome    Numbness    left arm pts wife states MD aware and no issues identified   Rotator cuff disorder    left    Sleep apnea    10/2001; does not use CPAP   TIA (transient ischemic attack)    11/2011   Urinary hesitancy    Wears glasses     Review of Systems  Constitutional: No fever/chills Cardiovascular: Positive chest pain. Respiratory: Positive shortness of  breath. Gastrointestinal: No abdominal pain.  No nausea, no vomiting.  No diarrhea.   Skin: Negative for rash. Neurological: Negative for headaches.  ____________________________________________   PHYSICAL EXAM:  VITAL SIGNS: ED Triage Vitals  Encounter Vitals Group     BP 11/30/23 1108 98/74     Pulse Rate 11/30/23 1108 87     Resp 11/30/23 1108 17     Temp 11/30/23 1108 97.9 F (36.6 C)     Temp Source 11/30/23 1108 Oral     SpO2 11/30/23 1108 94 %     Weight 11/30/23 1110 146 lb (66.2 kg)     Height 11/30/23 1110 5' 8 (1.727 m)   Constitutional: Alert and oriented. Well appearing and in no acute distress. Eyes: Conjunctivae are normal.  Head: Atraumatic. Nose: No congestion/rhinnorhea. Mouth/Throat: Mucous membranes are moist. Neck: No stridor.   Cardiovascular: Normal rate, regular rhythm. Good peripheral circulation. Grossly normal heart sounds.   Respiratory: Normal respiratory effort.  No retractions. Lungs CTAB. Gastrointestinal: Soft and nontender. No distention.  Musculoskeletal: No lower extremity tenderness nor edema. No gross deformities of extremities. Neurologic:  Normal speech and language.  Skin:  Skin is warm, dry and intact. No rash noted.  ____________________________________________   LABS (all labs ordered are listed, but only abnormal results are displayed)  Labs Reviewed  BASIC METABOLIC PANEL WITH GFR - Abnormal; Notable for the following components:      Result Value   Glucose, Bld 131 (*)    Creatinine, Ser 1.33 (*)    GFR, Estimated 51 (*)    All other components within normal limits  TROPONIN T, HIGH SENSITIVITY - Abnormal; Notable for the following components:   Troponin T High Sensitivity 33 (*)    All other components within normal limits  TROPONIN T, HIGH SENSITIVITY - Abnormal; Notable for the following components:   Troponin T High Sensitivity 32 (*)    All other components within normal limits  RESP PANEL BY RT-PCR (RSV, FLU  A&B, COVID)  RVPGX2  CBC  HEPATIC FUNCTION PANEL  LIPASE, BLOOD  PRO BRAIN NATRIURETIC PEPTIDE   ____________________________________________  EKG   EKG Interpretation Date/Time:  Monday November 30 2023 11:12:01 EDT Ventricular Rate:  82 PR Interval:  220 QRS Duration:  100 QT Interval:  382 QTC Calculation: 447 R Axis:   -55  Text Interpretation: Sinus rhythm Prolonged PR interval Inferior infarct, old Consider anterior infarct Confirmed by Darra Chew [phone]) on 11/30/2023 1:18:18 PM        ____________________________________________  RADIOLOGY  CT Angio Chest/Abd/Pel for Dissection W and/or Wo Contrast Result Date: 11/30/2023 CLINICAL DATA:  Provided history: Acute aortic syndrome (AAS) suspected Patient reports chest tightness.  Feeling hoarse. EXAM: CT ANGIOGRAPHY CHEST, ABDOMEN AND PELVIS TECHNIQUE: Non-contrast CT of the chest was initially obtained. Multidetector CT imaging through the chest, abdomen and pelvis was performed using the standard protocol during bolus administration of intravenous contrast. Multiplanar reconstructed images and MIPs were obtained and reviewed to evaluate the vascular anatomy. RADIATION DOSE REDUCTION: This exam was performed according to the departmental dose-optimization program which includes automated exposure control, adjustment of the mA and/or kV according to patient size and/or use of iterative reconstruction technique. CONTRAST:  OMNIPAQUE  IOHEXOL  350 MG/ML SOLN COMPARISON:  Chest radiograph earlier today. Abdominopelvic CT 08/17/2023, chest CT 04/30/2020 FINDINGS: CTA CHEST FINDINGS Cardiovascular: No aortic hematoma on unenhanced exam. Moderate to advanced aortic atherosclerosis. Irregular calcified noncalcified atheromatous plaque. No dissection or acute aortic findings. The ascending aorta is dilated at 4.1 cm. Descending aorta is tortuous. There is no pulmonary embolus. The heart is mildly enlarged. No pericardial effusion.  Mediastinum/Nodes: Scattered small mediastinal lymph nodes, not enlarged by size criteria. 16 mm right hilar lymph node, unchanged from 2022. Decompressed esophagus with small hiatal hernia and wall thickening of the distal esophagus. Lungs/Pleura: Mild emphysema. Chronic subpleural reticulation. Left apical pulmonary nodule, series 303, image 17 is unchanged from 2022. small pulmonary nodule in the right lower lobe, series 303, image 57, is also unchanged from 2022. No acute airspace disease. No pleural effusion. Musculoskeletal: There are no acute or suspicious osseous abnormalities. Review of the MIP images confirms the above findings. CTA ABDOMEN AND PELVIS FINDINGS VASCULAR Aorta: Normal caliber aorta without aneurysm, dissection, vasculitis or significant stenosis. Moderate atherosclerosis. Celiac: Patent without evidence of aneurysm, dissection, vasculitis or significant stenosis. Mild plaque at the origin without significant stenosis. SMA: Patent without evidence of aneurysm, dissection, vasculitis or significant stenosis. Renals: Both renal arteries are patent without evidence of aneurysm, dissection, vasculitis, fibromuscular dysplasia or significant stenosis. IMA: Patent without evidence of aneurysm, dissection, vasculitis or significant stenosis. Inflow: Patent without evidence of aneurysm, dissection, vasculitis or significant stenosis. Moderate atherosclerosis. Veins: No obvious venous abnormality within the limitations of this arterial phase study. Review of the MIP images confirms the above findings. NON-VASCULAR  Hepatobiliary: Stable hepatic cysts. Minimal layering hyperdensity in the gallbladder, no pericholecystic inflammation. No biliary dilatation. Pancreas: No ductal dilatation or inflammation. Spleen: Normal in size without focal abnormality. Adrenals/Urinary Tract: No adrenal nodule. Bilateral renal simple and parapelvic cysts. No further follow-up imaging is recommended. No hydronephrosis.  Moderate bladder distention without wall thickening. Stomach/Bowel: No bowel obstruction or inflammation. The fluid previously seen in the region of the gastroesophageal junction is not seen on the current exam there is wall thickening in this region. No small bowel obstruction or inflammation. Normal appendix. Moderate diffuse colonic stool burden. Diffuse colonic diverticulosis. No diverticulitis. No colonic inflammation. Lymphatic: No enlarged lymph nodes in the abdomen or pelvis. Reproductive: Enlarged prostate. Other: No ascites or free air. Musculoskeletal: Postsurgical change at L4-L5. L3-L4 degenerative disc disease. No acute osseous findings. Review of the MIP images confirms the above findings. IMPRESSION: 1. No aortic dissection or acute aortic abnormality. 2. Small hiatal hernia with wall thickening of the distal esophagus. The previous fluid collection at the gastroesophageal junction is no longer seen. 3. Enlarged right hilar lymph node is nonspecific, but likely reactive. This was present on remote chest CT. No further imaging follow-up is needed. 4. Colonic diverticulosis without diverticulitis. 5. Enlarged prostate. Aortic Atherosclerosis (ICD10-I70.0) and Emphysema (ICD10-J43.9). Electronically Signed   By: Andrea Gasman M.D.   On: 11/30/2023 17:20   DG Chest 2 View Result Date: 11/30/2023 CLINICAL DATA:  Chest tightness.  Shortness of breath.  Dizziness. EXAM: CHEST - 2 VIEW COMPARISON:  11/20/2023 FINDINGS: Borderline cardiomegaly, unchanged from prior exam. Stable mediastinal contours. Aortic atherosclerosis. Chronic interstitial coarsening. No acute airspace disease. No pleural effusion. No pneumothorax. No pulmonary edema. No acute osseous findings. IMPRESSION: Borderline cardiomegaly. Chronic interstitial coarsening. Electronically Signed   By: Andrea Gasman M.D.   On: 11/30/2023 12:18    ____________________________________________   PROCEDURES  Procedure(s) performed:    Procedures  None  ____________________________________________   INITIAL IMPRESSION / ASSESSMENT AND PLAN / ED COURSE  Pertinent labs & imaging results that were available during my care of the patient were reviewed by me and considered in my medical decision making (see chart for details).   This patient is Presenting for Evaluation of CP/SOB, which does require a range of treatment options, and is a complaint that involves a high risk of morbidity and mortality.  The Differential Diagnoses includes but is not exclusive to acute coronary syndrome, aortic dissection, pulmonary embolism, cardiac tamponade, community-acquired pneumonia, pericarditis, musculoskeletal chest wall pain, etc.   Critical Interventions-    Medications  lactated ringers  bolus 500 mL (0 mLs Intravenous Stopped 11/30/23 1825)  iohexol  (OMNIPAQUE ) 350 MG/ML injection 100 mL (100 mLs Intravenous Contrast Given 11/30/23 1618)    Reassessment after intervention: symptoms improved.    I did obtain Additional Historical Information from wife at bedside.   Clinical Laboratory Tests Ordered, included CBC without leukocytosis or anemia.  Creatinine slightly elevated from prior lab values at 1.33.  Normal potassium.  BUN normal.   Radiologic Tests Ordered, included CXR. I independently interpreted the images and agree with radiology interpretation.   Cardiac Monitor Tracing which shows NSR.    Social Determinants of Health Risk patient is not an active smoker.   Medical Decision Making: Summary:  The patient presents emergency department chest pain shortness of breath and fatigue.  Overall does not appear acutely volume overloaded.  Abdomen soft and nontender.  I do not appreciate significant leg swelling.  Any of his symptoms seem to be related  to reflux esophagitis/hiatal hernia which is a well-known issue for the patient.  Plan for screening blood work including troponin and BNP.   Reevaluation with update and  discussion with Dr. Dreama pending LFTs and BNP.   Considered admission but workup is ongoing.   Patient's presentation is most consistent with acute presentation with potential threat to life or bodily function.   Disposition: pending  ____________________________________________  FINAL CLINICAL IMPRESSION(S) / ED DIAGNOSES  Final diagnoses:  Chest pain, unspecified type  Hoarseness of voice    Note:  This document was prepared using Dragon voice recognition software and may include unintentional dictation errors.  Fonda Law, MD, [facility] Emergency Medicine    Mikea Quadros, Fonda MATSU, MD 12/01/23 [phone]

## 2023-12-04 DIAGNOSIS — Z23 Encounter for immunization: Secondary | ICD-10-CM | POA: Diagnosis not present

## 2023-12-09 DIAGNOSIS — R093 Abnormal sputum: Secondary | ICD-10-CM | POA: Diagnosis not present

## 2023-12-09 DIAGNOSIS — K219 Gastro-esophageal reflux disease without esophagitis: Secondary | ICD-10-CM | POA: Diagnosis not present

## 2023-12-09 DIAGNOSIS — E739 Lactose intolerance, unspecified: Secondary | ICD-10-CM | POA: Diagnosis not present

## 2023-12-09 DIAGNOSIS — R109 Unspecified abdominal pain: Secondary | ICD-10-CM | POA: Diagnosis not present

## 2023-12-09 DIAGNOSIS — R140 Abdominal distension (gaseous): Secondary | ICD-10-CM | POA: Diagnosis not present

## 2023-12-10 DIAGNOSIS — I10 Essential (primary) hypertension: Secondary | ICD-10-CM | POA: Diagnosis not present

## 2023-12-10 DIAGNOSIS — I2510 Atherosclerotic heart disease of native coronary artery without angina pectoris: Secondary | ICD-10-CM | POA: Diagnosis not present

## 2023-12-16 DIAGNOSIS — K909 Intestinal malabsorption, unspecified: Secondary | ICD-10-CM | POA: Diagnosis not present

## 2023-12-16 DIAGNOSIS — R140 Abdominal distension (gaseous): Secondary | ICD-10-CM | POA: Diagnosis not present

## 2023-12-16 DIAGNOSIS — N400 Enlarged prostate without lower urinary tract symptoms: Secondary | ICD-10-CM | POA: Diagnosis not present

## 2023-12-16 DIAGNOSIS — R109 Unspecified abdominal pain: Secondary | ICD-10-CM | POA: Diagnosis not present

## 2023-12-16 DIAGNOSIS — J302 Other seasonal allergic rhinitis: Secondary | ICD-10-CM | POA: Diagnosis not present

## 2023-12-16 DIAGNOSIS — I10 Essential (primary) hypertension: Secondary | ICD-10-CM | POA: Diagnosis not present

## 2023-12-16 DIAGNOSIS — K589 Irritable bowel syndrome without diarrhea: Secondary | ICD-10-CM | POA: Diagnosis not present

## 2023-12-16 DIAGNOSIS — R093 Abnormal sputum: Secondary | ICD-10-CM | POA: Diagnosis not present

## 2023-12-16 DIAGNOSIS — R7303 Prediabetes: Secondary | ICD-10-CM | POA: Diagnosis not present

## 2023-12-16 DIAGNOSIS — K219 Gastro-esophageal reflux disease without esophagitis: Secondary | ICD-10-CM | POA: Diagnosis not present

## 2023-12-16 DIAGNOSIS — E739 Lactose intolerance, unspecified: Secondary | ICD-10-CM | POA: Diagnosis not present

## 2023-12-16 DIAGNOSIS — Z23 Encounter for immunization: Secondary | ICD-10-CM | POA: Diagnosis not present

## 2023-12-25 DIAGNOSIS — I10 Essential (primary) hypertension: Secondary | ICD-10-CM | POA: Diagnosis not present

## 2023-12-25 DIAGNOSIS — J4530 Mild persistent asthma, uncomplicated: Secondary | ICD-10-CM | POA: Diagnosis not present

## 2023-12-25 DIAGNOSIS — N400 Enlarged prostate without lower urinary tract symptoms: Secondary | ICD-10-CM | POA: Diagnosis not present

## 2023-12-25 DIAGNOSIS — I2510 Atherosclerotic heart disease of native coronary artery without angina pectoris: Secondary | ICD-10-CM | POA: Diagnosis not present

## 2024-01-09 DIAGNOSIS — I2510 Atherosclerotic heart disease of native coronary artery without angina pectoris: Secondary | ICD-10-CM | POA: Diagnosis not present

## 2024-01-09 DIAGNOSIS — I10 Essential (primary) hypertension: Secondary | ICD-10-CM | POA: Diagnosis not present

## 2024-01-11 DIAGNOSIS — D692 Other nonthrombocytopenic purpura: Secondary | ICD-10-CM | POA: Diagnosis not present

## 2024-01-11 DIAGNOSIS — Z85828 Personal history of other malignant neoplasm of skin: Secondary | ICD-10-CM | POA: Diagnosis not present

## 2024-01-11 DIAGNOSIS — L853 Xerosis cutis: Secondary | ICD-10-CM | POA: Diagnosis not present

## 2024-01-11 DIAGNOSIS — L821 Other seborrheic keratosis: Secondary | ICD-10-CM | POA: Diagnosis not present

## 2024-01-11 DIAGNOSIS — L812 Freckles: Secondary | ICD-10-CM | POA: Diagnosis not present

## 2024-01-11 DIAGNOSIS — D1801 Hemangioma of skin and subcutaneous tissue: Secondary | ICD-10-CM | POA: Diagnosis not present

## 2024-01-11 DIAGNOSIS — Z85820 Personal history of malignant melanoma of skin: Secondary | ICD-10-CM | POA: Diagnosis not present

## 2024-01-24 DIAGNOSIS — J4530 Mild persistent asthma, uncomplicated: Secondary | ICD-10-CM | POA: Diagnosis not present

## 2024-01-24 DIAGNOSIS — N400 Enlarged prostate without lower urinary tract symptoms: Secondary | ICD-10-CM | POA: Diagnosis not present

## 2024-01-24 DIAGNOSIS — I2510 Atherosclerotic heart disease of native coronary artery without angina pectoris: Secondary | ICD-10-CM | POA: Diagnosis not present

## 2024-01-24 DIAGNOSIS — I10 Essential (primary) hypertension: Secondary | ICD-10-CM | POA: Diagnosis not present

## 2024-02-03 DIAGNOSIS — K580 Irritable bowel syndrome with diarrhea: Secondary | ICD-10-CM | POA: Diagnosis not present

## 2024-02-03 DIAGNOSIS — K6389 Other specified diseases of intestine: Secondary | ICD-10-CM | POA: Diagnosis not present

## 2024-03-28 ENCOUNTER — Other Ambulatory Visit (HOSPITAL_COMMUNITY): Payer: Self-pay | Admitting: *Deleted

## 2024-03-28 DIAGNOSIS — R059 Cough, unspecified: Secondary | ICD-10-CM

## 2024-03-28 DIAGNOSIS — R1310 Dysphagia, unspecified: Secondary | ICD-10-CM

## 2024-04-21 ENCOUNTER — Encounter (HOSPITAL_COMMUNITY)
